# Patient Record
Sex: Male | Born: 1945 | Race: White | Hispanic: No | Marital: Married | Smoking: Former smoker
Health system: Southern US, Community
[De-identification: ages and names within clinical notes are randomized; demographics above are authoritative.]

## PROBLEM LIST (undated history)

## (undated) DIAGNOSIS — M1990 Unspecified osteoarthritis, unspecified site: Secondary | ICD-10-CM

## (undated) DIAGNOSIS — I350 Nonrheumatic aortic (valve) stenosis: Secondary | ICD-10-CM

## (undated) DIAGNOSIS — R011 Cardiac murmur, unspecified: Secondary | ICD-10-CM

## (undated) DIAGNOSIS — J45909 Unspecified asthma, uncomplicated: Secondary | ICD-10-CM

## (undated) DIAGNOSIS — E119 Type 2 diabetes mellitus without complications: Secondary | ICD-10-CM

## (undated) DIAGNOSIS — R350 Frequency of micturition: Secondary | ICD-10-CM

## (undated) DIAGNOSIS — G4733 Obstructive sleep apnea (adult) (pediatric): Secondary | ICD-10-CM

## (undated) DIAGNOSIS — I8390 Asymptomatic varicose veins of unspecified lower extremity: Secondary | ICD-10-CM

## (undated) DIAGNOSIS — E669 Obesity, unspecified: Secondary | ICD-10-CM

## (undated) DIAGNOSIS — S5420XA Injury of radial nerve at forearm level, unspecified arm, initial encounter: Secondary | ICD-10-CM

## (undated) DIAGNOSIS — M1712 Unilateral primary osteoarthritis, left knee: Secondary | ICD-10-CM

## (undated) DIAGNOSIS — C801 Malignant (primary) neoplasm, unspecified: Secondary | ICD-10-CM

## (undated) DIAGNOSIS — I5032 Chronic diastolic (congestive) heart failure: Secondary | ICD-10-CM

## (undated) DIAGNOSIS — Z953 Presence of xenogenic heart valve: Secondary | ICD-10-CM

## (undated) HISTORY — DX: Chronic diastolic (congestive) heart failure: I50.32

## (undated) HISTORY — PX: CARPAL TUNNEL RELEASE: SHX101

## (undated) HISTORY — DX: Asymptomatic varicose veins of unspecified lower extremity: I83.90

## (undated) HISTORY — PX: CERVICAL SPINE SURGERY: SHX589

## (undated) HISTORY — DX: Obesity, unspecified: E66.9

## (undated) HISTORY — PX: PENILE PROSTHESIS IMPLANT: SHX240

## (undated) HISTORY — DX: Type 2 diabetes mellitus without complications: E11.9

## (undated) HISTORY — DX: Obstructive sleep apnea (adult) (pediatric): G47.33

## (undated) HISTORY — PX: COLONOSCOPY: SHX174

## (undated) HISTORY — DX: Injury of radial nerve at forearm level, unspecified arm, initial encounter: S54.20XA

---

## 1989-09-04 HISTORY — PX: SKIN CANCER EXCISION: SHX779

## 2006-02-18 ENCOUNTER — Encounter: Admission: RE | Admit: 2006-02-18 | Discharge: 2006-02-18 | Payer: Self-pay | Admitting: Orthopedic Surgery

## 2010-03-21 HISTORY — PX: POLYPECTOMY: SHX149

## 2010-12-19 ENCOUNTER — Ambulatory Visit (HOSPITAL_BASED_OUTPATIENT_CLINIC_OR_DEPARTMENT_OTHER)
Admission: RE | Admit: 2010-12-19 | Discharge: 2010-12-19 | Disposition: A | Discharge status: Home / Self Care | Payer: Medicare Other | Source: Ambulatory Visit | Attending: Orthopedic Surgery | Admitting: Orthopedic Surgery

## 2010-12-19 DIAGNOSIS — M23359 Other meniscus derangements, posterior horn of lateral meniscus, unspecified knee: Secondary | ICD-10-CM | POA: Insufficient documentation

## 2010-12-19 DIAGNOSIS — J449 Chronic obstructive pulmonary disease, unspecified: Secondary | ICD-10-CM | POA: Insufficient documentation

## 2010-12-19 DIAGNOSIS — Z79899 Other long term (current) drug therapy: Secondary | ICD-10-CM | POA: Insufficient documentation

## 2010-12-19 DIAGNOSIS — I10 Essential (primary) hypertension: Secondary | ICD-10-CM | POA: Insufficient documentation

## 2010-12-19 DIAGNOSIS — E119 Type 2 diabetes mellitus without complications: Secondary | ICD-10-CM | POA: Insufficient documentation

## 2010-12-19 DIAGNOSIS — M659 Unspecified synovitis and tenosynovitis, unspecified site: Secondary | ICD-10-CM | POA: Insufficient documentation

## 2010-12-19 DIAGNOSIS — M23329 Other meniscus derangements, posterior horn of medial meniscus, unspecified knee: Secondary | ICD-10-CM | POA: Insufficient documentation

## 2010-12-19 DIAGNOSIS — M2240 Chondromalacia patellae, unspecified knee: Secondary | ICD-10-CM | POA: Insufficient documentation

## 2010-12-19 DIAGNOSIS — E669 Obesity, unspecified: Secondary | ICD-10-CM | POA: Insufficient documentation

## 2010-12-19 DIAGNOSIS — J4489 Other specified chronic obstructive pulmonary disease: Secondary | ICD-10-CM | POA: Insufficient documentation

## 2010-12-19 LAB — POCT I-STAT, CHEM 8
Calcium, Ion: 1.19 mmol/L (ref 1.12–1.32)
Chloride: 102 mEq/L (ref 96–112)
Creatinine, Ser: 1 mg/dL (ref 0.4–1.5)
HCT: 44 % (ref 39.0–52.0)
Hemoglobin: 15 g/dL (ref 13.0–17.0)
Potassium: 4.1 mEq/L (ref 3.5–5.1)
Sodium: 137 mEq/L (ref 135–145)

## 2010-12-19 LAB — GLUCOSE, CAPILLARY: Glucose-Capillary: 208 mg/dL — ABNORMAL HIGH (ref 70–99)

## 2010-12-25 NOTE — Op Note (Signed)
NAMEGROVE, DEFINA               ACCOUNT NO.:  [id number]  MEDICAL RECORD NO.:  [id number]          PATIENT TYPE:  LOCATION:                                 FACILITY:  PHYSICIAN:  Elana Alm. Thurston Hole, M.D.      DATE OF BIRTH:  DATE OF PROCEDURE:  12/19/2010 DATE OF DISCHARGE:                              OPERATIVE REPORT   PREOPERATIVE DIAGNOSIS:  Left knee medial and lateral meniscal tears with chondromalacia and synovitis.  POSTOPERATIVE DIAGNOSIS:  Left knee medial and lateral meniscal tears with chondromalacia and synovitis.  PROCEDURE: 1. Left knee EUA followed by arthroscopic partial medial lateral     meniscectomies. 2. Left knee chondroplasty with partial synovectomy.  SURGEON:  Elana Alm. Thurston Hole, MD  ANESTHESIA:  General.  OPERATIVE TIME:  30 minutes.  COMPLICATIONS:  None.  INDICATIONS FOR PROCEDURE:  Mr. Reczek is a 64-year gentleman who has had significant increasing left knee pain over the past 3-4 months with exam and MRI documenting meniscal tearing with chondromalacia and synovitis. He has failed conservative care and is now to undergo arthroscopy.  DESCRIPTION OF PROCEDURE:  Mr. Barbian was brought to the operating room on December 19, 2010, after knee block was placed in the holding by Anesthesia.  He was placed on the operating table in supine position. He received Ancef 2 g IV preoperatively for prophylaxis.  After being placed under general anesthesia, his left knee was examined.  He had full range of motion.  Knee was stable.  Ligamentous exam with normal patellar tracking.  Left leg was prepped using sterile DuraPrep and draped using sterile technique.  Time-out procedure was called and the correct left knee identified.  Initially, through an anterolateral portal, the arthroscope with a pump attached was placed and through an anteromedial portal, an arthroscopic probe was placed.  On initial inspection of the medial compartment, he was found to have  75% grade 3 chondromalacia, which was debrided.  Medial meniscus tear of the posterior and medial horn of which 30% was resected back to a stable rim.  Intercondylar notch inspected.  Anteroposterior cruciate ligaments were normal.  Lateral compartment inspected.  The articular cartilage lateral compartment showed 30-40% grade 3 chondromalacia which was debrided.  Lateral meniscus showed tearing of the posterior and lateral horn of which 30% was resected back to a stable rim.  Patellofemoral joint showed 75% grade 3 chondromalacia which was debrided.  The patella tracked normally.  Moderate synovitis.  Medial and lateral gutters were debrided, otherwise this free of pathology.  After this done, it felt that all pathology have been satisfactorily addressed.  The instruments removed.  Portals were closed with 3-0 nylon suture.  Sterile dressings were applied.  The patient awakened and taken to the recovery room in stable condition.  FOLLOWUP CARE:  Mr. Harn will be followed as an outpatient on Norco for pain.  She will be back in the office in a week for sutures out and followup.     Hollyanne Schloesser A. Thurston Hole, M.D.     RAW/MEDQ  D:  12/19/2010  T:  12/20/2010  Job:  [id number]  Electronically Signed  by Salvatore Marvel M.D. on 12/25/2010 01:06:50 PM

## 2011-09-05 HISTORY — PX: KNEE SURGERY: SHX244

## 2011-09-05 HISTORY — PX: SHOULDER SURGERY: SHX246

## 2015-07-01 ENCOUNTER — Inpatient Hospital Stay (HOSPITAL_COMMUNITY)
Admission: RE | Admit: 2015-07-01 | Discharge: 2015-07-01 | Disposition: A | Discharge status: Home / Self Care | Payer: Medicare Other | Source: Ambulatory Visit

## 2015-07-01 ENCOUNTER — Other Ambulatory Visit (HOSPITAL_COMMUNITY): Payer: Medicare Other

## 2015-07-02 ENCOUNTER — Encounter: Payer: Self-pay | Admitting: *Deleted

## 2015-07-02 ENCOUNTER — Ambulatory Visit (INDEPENDENT_AMBULATORY_CARE_PROVIDER_SITE_OTHER): Payer: Medicare Other | Admitting: Cardiology

## 2015-07-02 VITALS — BP 135/84 | HR 89 | Ht 73.0 in | Wt 274.0 lb

## 2015-07-02 DIAGNOSIS — R011 Cardiac murmur, unspecified: Secondary | ICD-10-CM

## 2015-07-02 DIAGNOSIS — Z01810 Encounter for preprocedural cardiovascular examination: Secondary | ICD-10-CM

## 2015-07-02 NOTE — Patient Instructions (Signed)
Continue all current medications. Your physician has requested that you have an echocardiogram. Echocardiography is a painless test that uses sound waves to create images of your heart. It provides your doctor with information about the size and shape of your heart and how well your heart's chambers and valves are working. This procedure takes approximately one hour. There are no restrictions for this procedure. Office will contact with results via phone or letter.   Follow up based off test results from echo.

## 2015-07-02 NOTE — Progress Notes (Signed)
Patient ID: Brent Cobb, male   DOB: 25-May-1946, 69 y.o.   MRN: [id number]       Clinical Summary Brent Cobb is a 69 y.o.male seen today as a new patient fo the following medical problems.   1. Preop evaluation - being considered for knee replacement - denies any history of heart disease.  - 3 episodes of chest pain over the 2 last weeks. 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 Hard pain left chest/heaviness, 4/10. No other associated symptoms. Better with deep breaths. Lasted about 30 seconds. Similar episodes x 3.  - exertion limited due to knee pain. States can walk up 1-2 flights of stairs, can walk approx 2 blocks. Mainly limited by knee pain.   CAD risk factors: DM2, HL but not on statin due to side effects, prior smoke x 33 years, sister with valve surgery, mother MI mid 93s.   2. Heart murmur - patient reports recently told he has a heart murmur by pcp  Past Medical History  Diagnosis Date  . Diabetes mellitus ([place])      No Known Allergies   Current Outpatient Prescriptions  Medication Sig Dispense Refill  . ALPRAZolam (XANAX) 0.5 MG tablet Take 0.5 mg by mouth at bedtime.    Marland Kitchen aspirin EC 81 MG tablet Take 81 mg by mouth daily.    . cholecalciferol (VITAMIN D) 1000 UNITS tablet Take 1,000 Units by mouth daily.    . insulin NPH Human (HUMULIN N,NOVOLIN N) 100 UNIT/ML injection Inject 30 Units into the skin 3 (three) times daily.     . Liraglutide (VICTOZA) 18 MG/3ML SOPN Inject 18 mg into the skin daily.    Marland Kitchen losartan (COZAAR) 100 MG tablet Take 100 mg by mouth daily.    . meloxicam (MOBIC) 7.5 MG tablet Take 7.5 mg by mouth daily.    . sitaGLIPtin-metformin (JANUMET) 50-1000 MG tablet Take 1 tablet by mouth 2 (two) times daily with a meal.    . VITAMIN E PO Take 1 capsule by mouth daily.     No current facility-administered medications for this visit.     Past Surgical History  Procedure Laterality Date  . Polypectomy  03/21/2010  . Cervical spine surgery        30 years ago and last time 10 years ago  . Skin cancer excision  1991    removal of skin cancer to right cheek area  . Knee surgery Left 2013  . Shoulder surgery Left 2013     No Known Allergies    Family History  Problem Relation Age of Onset  . Heart attack Mother   . Alcoholism Father   . Diabetes Brother   . Valvular heart disease Sister      Social History Brent Cobb reports that he quit smoking about 25 years ago. His smoking use included Cigarettes. He started smoking about 58 years ago. He has a 49.5 pack-year smoking history. He quit smokeless tobacco use about 45 years ago. Brent Cobb has no alcohol history on file.   Review of Systems CONSTITUTIONAL: No weight loss, fever, chills, weakness or fatigue.  HEENT: Eyes: No visual loss, blurred vision, double vision or yellow sclerae.No hearing loss, sneezing, congestion, runny nose or sore throat.  SKIN: No rash or itching.  CARDIOVASCULAR: per hpi RESPIRATORY: No cough or sputum.  GASTROINTESTINAL: No anorexia, nausea, vomiting or diarrhea. No abdominal pain or blood.  GENITOURINARY: No burning on urination, no polyuria NEUROLOGICAL: No headache, dizziness, syncope, paralysis, ataxia, numbness  or tingling in the extremities. No change in bowel or bladder control.  MUSCULOSKELETAL: knee pain  LYMPHATICS: No enlarged nodes. No history of splenectomy.  PSYCHIATRIC: No history of depression or anxiety.  ENDOCRINOLOGIC: No reports of sweating, cold or heat intolerance. No polyuria or polydipsia.  Marland Kitchen   Physical Examination Filed Vitals:   07/02/15 1520  BP: 135/84  Pulse: 89   Filed Weights   07/02/15 1520  Weight: 274 lb (124.286 kg)    Gen: resting comfortably, no acute distress HEENT: no scleral icterus, pupils equal round and reactive, no palptable cervical adenopathy,  CV: RRR, 3/6 systolic murmur RUSB, no jvd Resp: Clear to auscultation bilaterally GI: abdomen is soft, non-tender, non-distended, normal  bowel sounds, no hepatosplenomegaly MSK: extremities are warm, no edema.  Skin: warm, no rash Neuro:  no focal deficits Psych: appropriate affect    Assessment and Plan   1. Heart murmur - will obtain echo  2 Preop evaluation - with heart murmur and episodes of chest pain would delay surgery until workup complete. F/u echo results, likely consider lexiscan pending echo results  F/u pending rest results     Arnoldo Lenis, M.D.

## 2015-07-07 ENCOUNTER — Ambulatory Visit (HOSPITAL_COMMUNITY)
Admission: RE | Admit: 2015-07-07 | Discharge: 2015-07-07 | Disposition: A | Discharge status: Home / Self Care | Payer: Medicare Other | Source: Ambulatory Visit | Attending: Cardiology | Admitting: Cardiology

## 2015-07-07 DIAGNOSIS — R011 Cardiac murmur, unspecified: Secondary | ICD-10-CM | POA: Diagnosis present

## 2015-07-08 ENCOUNTER — Telehealth: Payer: Self-pay | Admitting: *Deleted

## 2015-07-08 NOTE — Telephone Encounter (Signed)
Pt aware, scheduled for 11/11, routed results to pcp

## 2015-07-08 NOTE — Telephone Encounter (Signed)
-----   Message from Arnoldo Lenis, MD sent at 07/08/2015 11:45 AM EDT ----- Echo shows that his aortic valve does not open very well (something called aortic stenosis), this is the cause of his heart murmur. We need to discuss from further testing on his heart valve, can we add him to one of me [place] days coming up over the next 1-2 weeks, looks like there are some openings. We need to look resolve this heart issue before we can clear him to have his knee replacement  Zandra Abts MD

## 2015-07-12 ENCOUNTER — Inpatient Hospital Stay (HOSPITAL_COMMUNITY): Admission: RE | Admit: 2015-07-12 | Payer: Medicare Other | Source: Ambulatory Visit | Admitting: Orthopedic Surgery

## 2015-07-12 ENCOUNTER — Encounter (HOSPITAL_COMMUNITY): Admission: RE | Payer: Self-pay | Source: Ambulatory Visit

## 2015-07-12 SURGERY — ARTHROPLASTY, KNEE, TOTAL
Anesthesia: General | Laterality: Left

## 2015-07-16 ENCOUNTER — Encounter: Payer: Self-pay | Admitting: *Deleted

## 2015-07-16 ENCOUNTER — Telehealth: Payer: Self-pay | Admitting: Cardiology

## 2015-07-16 ENCOUNTER — Encounter: Payer: Self-pay | Admitting: Cardiology

## 2015-07-16 ENCOUNTER — Ambulatory Visit (INDEPENDENT_AMBULATORY_CARE_PROVIDER_SITE_OTHER): Payer: Medicare Other | Admitting: Cardiology

## 2015-07-16 VITALS — BP 116/78 | HR 98 | Ht 73.0 in | Wt 274.2 lb

## 2015-07-16 DIAGNOSIS — I350 Nonrheumatic aortic (valve) stenosis: Secondary | ICD-10-CM | POA: Diagnosis not present

## 2015-07-16 NOTE — Patient Instructions (Signed)
Your physician recommends that you schedule a follow-up appointment TO BE DETERMINED AFTER PROCEDURE  Your physician recommends that you continue on your current medications as directed. Please refer to the Current Medication list given to you today.  Your physician has requested that you have a cardiac catheterization. Cardiac catheterization is used to diagnose and/or treat various heart conditions. Doctors may recommend this procedure for a number of different reasons. The most common reason is to evaluate chest pain. Chest pain can be a symptom of coronary artery disease (CAD), and cardiac catheterization can show whether plaque is narrowing or blocking your heart's arteries. This procedure is also used to evaluate the valves, as well as measure the blood flow and oxygen levels in different parts of your heart. For further information please visit [email or web address]. Please follow instruction sheet, as given.  Thank you for choosing [facility]!!

## 2015-07-16 NOTE — Telephone Encounter (Signed)
Pt has Medicare and supplement. No precert required °

## 2015-07-16 NOTE — Telephone Encounter (Signed)
cath 11/16 1PM checking percert

## 2015-07-16 NOTE — Progress Notes (Signed)
Patient ID: Brent Cobb, male   DOB: 1945-10-02, 69 y.o.   MRN: [id number]     Clinical Summary Brent Cobb is a 69 y.o.male seen today for follow up of the following medical problems.   1. Aortic stenosis - patient seen last visit for preop evaluation prior to knee surgery due to murmur - echo showed LVEF 60-65%, severe AS with mean grad 42 and AVA by VTI of 1 and by VMAX of 0.96 - exertion is primarily limited by knee pain. He has had some episodes of exertional chest pain over the last few weeks.  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 Hard pain left chest/heaviness, 4/10. No other associated symptoms. Better with deep breaths. Lasted about 30 seconds. Similar episodes x 3.  - exertion limited due to knee pain. States can walk up 1-2 flights of stairs, can walk approx 2 blocks. Mainly limited by knee pain.   .  Past Medical History  Diagnosis Date  . Diabetes mellitus ([place])      No Known Allergies   Current Outpatient Prescriptions  Medication Sig Dispense Refill  . ALPRAZolam (XANAX) 0.5 MG tablet Take 0.5 mg by mouth at bedtime.    Marland Kitchen aspirin EC 81 MG tablet Take 81 mg by mouth daily.    . cholecalciferol (VITAMIN D) 1000 UNITS tablet Take 1,000 Units by mouth daily.    . insulin NPH Human (HUMULIN N,NOVOLIN N) 100 UNIT/ML injection Inject 30 Units into the skin 3 (three) times daily.     . Liraglutide (VICTOZA) 18 MG/3ML SOPN Inject 18 mg into the skin daily.    Marland Kitchen losartan (COZAAR) 100 MG tablet Take 100 mg by mouth daily.    . meloxicam (MOBIC) 7.5 MG tablet Take 7.5 mg by mouth daily.    . sitaGLIPtin-metformin (JANUMET) 50-1000 MG tablet Take 1 tablet by mouth 2 (two) times daily with a meal.    . VITAMIN E PO Take 1 capsule by mouth daily.     No current facility-administered medications for this visit.     Past Surgical History  Procedure Laterality Date  . Polypectomy  03/21/2010  . Cervical spine surgery      30 years ago and last time 10 years ago  .  Skin cancer excision  1991    removal of skin cancer to right cheek area  . Knee surgery Left 2013  . Shoulder surgery Left 2013     No Known Allergies    Family History  Problem Relation Age of Onset  . Heart attack Mother   . Alcoholism Father   . Diabetes Brother   . Valvular heart disease Sister      Social History Brent Cobb reports that he quit smoking about 25 years ago. His smoking use included Cigarettes. He started smoking about 58 years ago. He has a 49.5 pack-year smoking history. He quit smokeless tobacco use about 45 years ago. Brent Cobb has no alcohol history on file.   Review of Systems CONSTITUTIONAL: No weight loss, fever, chills, weakness or fatigue.  HEENT: Eyes: No visual loss, blurred vision, double vision or yellow sclerae.No hearing loss, sneezing, congestion, runny nose or sore throat.  SKIN: No rash or itching.  CARDIOVASCULAR: per HPI RESPIRATORY: No shortness of breath, cough or sputum.  GASTROINTESTINAL: No anorexia, nausea, vomiting or diarrhea. No abdominal pain or blood.  GENITOURINARY: No burning on urination, no polyuria NEUROLOGICAL: No headache, dizziness, syncope, paralysis, ataxia, numbness or tingling in the extremities. No change  in bowel or bladder control.  MUSCULOSKELETAL: knee pain LYMPHATICS: No enlarged nodes. No history of splenectomy.  PSYCHIATRIC: No history of depression or anxiety.  ENDOCRINOLOGIC: No reports of sweating, cold or heat intolerance. No polyuria or polydipsia.  Marland Kitchen   Physical Examination Filed Vitals:   07/16/15 1308  BP: 116/78  Pulse: 98   Filed Vitals:   07/16/15 1308  Height: 6\' 1"  (1.854 m)  Weight: 274 lb 3.2 oz (124.376 kg)    Gen: resting comfortably, no acute distress HEENT: no scleral icterus, pupils equal round and reactive, no palptable cervical adenopathy,  CV: RRR, 3/6 systolic murmur, no jvd Resp: Clear to auscultation bilaterally GI: abdomen is soft, non-tender, non-distended, normal  bowel sounds, no hepatosplenomegaly MSK: extremities are warm, no edema.  Skin: warm, no rash Neuro:  no focal deficits Psych: appropriate affect   Diagnostic Studies 07/2015 echo Study Conclusions  - Left ventricle: The cavity size was normal. Wall thickness was increased in a pattern of severe LVH. Systolic function was normal. The estimated ejection fraction was in the range of 60% to 65%. Wall motion was normal; there were no regional wall motion abnormalities. Doppler parameters are consistent with abnormal left ventricular relaxation (grade 1 diastolic dysfunction). - Aortic valve: Severely calcified annulus. Trileaflet; severely thickened leaflets. There was severe stenosis. The contour of the spectral Doppler waveform may lead to underestimation of severity. Valve area (VTI): 1.01 cm^2. Valve area (Vmax): 0.96 cm^2. Valve area (Vmean): 0.79 cm^2. - Mitral valve: Mildly calcified annulus. Mildly thickened leaflets . - Left atrium: The atrium was mildly dilated. - Technically adequate study.    Assessment and Plan  1. Severe aortic stenosis - patient initially referred for heart murmur evaluation prior to consideration for knee surgery. - echo consistent with severe AS. Exertion limited by knee pain difficult to assess symptoms, however has had episodes of chest pain recently that could be related.  - will refer for cath to evaluate coronaries along with AV study and RHC - in setting of severe AS he is high risk for elective ortho surgery and recommend postponing surgery at this time.    F/u pending cath results  Arnoldo Lenis, M.D.

## 2015-07-18 ENCOUNTER — Other Ambulatory Visit: Payer: Self-pay | Admitting: Cardiology

## 2015-07-18 DIAGNOSIS — I350 Nonrheumatic aortic (valve) stenosis: Secondary | ICD-10-CM

## 2015-07-19 ENCOUNTER — Ambulatory Visit: Payer: Medicare Other | Admitting: Cardiology

## 2015-07-21 ENCOUNTER — Encounter (HOSPITAL_COMMUNITY)
Admission: RE | Disposition: A | Discharge status: Home / Self Care | Payer: Self-pay | Source: Ambulatory Visit | Attending: Cardiovascular Disease

## 2015-07-21 ENCOUNTER — Encounter (HOSPITAL_COMMUNITY): Payer: Self-pay | Admitting: Cardiovascular Disease

## 2015-07-21 ENCOUNTER — Ambulatory Visit (HOSPITAL_COMMUNITY)
Admission: RE | Admit: 2015-07-21 | Discharge: 2015-07-21 | Disposition: A | Discharge status: Home / Self Care | Payer: Medicare Other | Source: Ambulatory Visit | Attending: Cardiovascular Disease | Admitting: Cardiovascular Disease

## 2015-07-21 DIAGNOSIS — Z87891 Personal history of nicotine dependence: Secondary | ICD-10-CM | POA: Insufficient documentation

## 2015-07-21 DIAGNOSIS — Z7984 Long term (current) use of oral hypoglycemic drugs: Secondary | ICD-10-CM | POA: Diagnosis not present

## 2015-07-21 DIAGNOSIS — I350 Nonrheumatic aortic (valve) stenosis: Secondary | ICD-10-CM | POA: Diagnosis not present

## 2015-07-21 DIAGNOSIS — Z79899 Other long term (current) drug therapy: Secondary | ICD-10-CM | POA: Diagnosis not present

## 2015-07-21 DIAGNOSIS — E119 Type 2 diabetes mellitus without complications: Secondary | ICD-10-CM | POA: Diagnosis not present

## 2015-07-21 DIAGNOSIS — Z8249 Family history of ischemic heart disease and other diseases of the circulatory system: Secondary | ICD-10-CM | POA: Insufficient documentation

## 2015-07-21 DIAGNOSIS — Z7982 Long term (current) use of aspirin: Secondary | ICD-10-CM | POA: Diagnosis not present

## 2015-07-21 DIAGNOSIS — I2510 Atherosclerotic heart disease of native coronary artery without angina pectoris: Secondary | ICD-10-CM | POA: Insufficient documentation

## 2015-07-21 HISTORY — DX: Nonrheumatic aortic (valve) stenosis: I35.0

## 2015-07-21 HISTORY — PX: CARDIAC CATHETERIZATION: SHX172

## 2015-07-21 LAB — POCT I-STAT 3, VENOUS BLOOD GAS (G3P V)
Acid-base deficit: 1 mmol/L (ref 0.0–2.0)
BICARBONATE: 24.9 meq/L — AB (ref 20.0–24.0)
O2 SAT: 63 %
PCO2 VEN: 44.5 mmHg — AB (ref 45.0–50.0)
PO2 VEN: 34 mmHg (ref 30.0–45.0)
TCO2: 26 mmol/L (ref 0–100)
pH, Ven: 7.355 — ABNORMAL HIGH (ref 7.250–7.300)

## 2015-07-21 LAB — POCT I-STAT, CHEM 8
BUN: 20 mg/dL (ref 6–20)
CHLORIDE: 103 mmol/L (ref 101–111)
CREATININE: 1 mg/dL (ref 0.61–1.24)
Calcium, Ion: 1.26 mmol/L (ref 1.13–1.30)
Glucose, Bld: 302 mg/dL — ABNORMAL HIGH (ref 65–99)
HCT: 41 % (ref 39.0–52.0)
HEMOGLOBIN: 13.9 g/dL (ref 13.0–17.0)
Potassium: 4.7 mmol/L (ref 3.5–5.1)
Sodium: 137 mmol/L (ref 135–145)
TCO2: 22 mmol/L (ref 0–100)

## 2015-07-21 LAB — POCT I-STAT 3, ART BLOOD GAS (G3+)
Acid-base deficit: 2 mmol/L (ref 0.0–2.0)
Bicarbonate: 23 mEq/L (ref 20.0–24.0)
O2 SAT: 94 %
PCO2 ART: 38.5 mmHg (ref 35.0–45.0)
PH ART: 7.383 (ref 7.350–7.450)
PO2 ART: 70 mmHg — AB (ref 80.0–100.0)
TCO2: 24 mmol/L (ref 0–100)

## 2015-07-21 LAB — PROTIME-INR
INR: 1.01 (ref 0.00–1.49)
PROTHROMBIN TIME: 13.5 s (ref 11.6–15.2)

## 2015-07-21 LAB — BASIC METABOLIC PANEL
ANION GAP: 7 (ref 5–15)
BUN: 19 mg/dL (ref 6–20)
CALCIUM: 9.4 mg/dL (ref 8.9–10.3)
CO2: 25 mmol/L (ref 22–32)
Chloride: 102 mmol/L (ref 101–111)
Creatinine, Ser: 1.11 mg/dL (ref 0.61–1.24)
GFR calc Af Amer: >60 mL/min (ref >60)
GLUCOSE: 299 mg/dL — AB (ref 65–99)
Potassium: 6.4 mmol/L (ref 3.5–5.1)
Sodium: 134 mmol/L — ABNORMAL LOW (ref 135–145)

## 2015-07-21 LAB — CBC
HEMATOCRIT: 43.3 % (ref 39.0–52.0)
HEMOGLOBIN: 13.8 g/dL (ref 13.0–17.0)
MCH: 28.7 pg (ref 26.0–34.0)
MCHC: 31.9 g/dL (ref 30.0–36.0)
MCV: 90 fL (ref 78.0–100.0)
Platelets: 286 10*3/uL (ref 150–400)
RBC: 4.81 MIL/uL (ref 4.22–5.81)
RDW: 13.9 % (ref 11.5–15.5)
WBC: 7.5 10*3/uL (ref 4.0–10.5)

## 2015-07-21 LAB — GLUCOSE, CAPILLARY: Glucose-Capillary: 237 mg/dL — ABNORMAL HIGH (ref 65–99)

## 2015-07-21 SURGERY — RIGHT/LEFT HEART CATH AND CORONARY ANGIOGRAPHY

## 2015-07-21 MED ORDER — ASPIRIN 81 MG PO CHEW
CHEWABLE_TABLET | ORAL | Status: AC
  Administered 2015-07-21: 81 mg via ORAL
  Filled 2015-07-21: qty 1

## 2015-07-21 MED ORDER — SODIUM CHLORIDE 0.9 % IJ SOLN: 3.0000 mL | INTRAMUSCULAR | Status: DC | PRN

## 2015-07-21 MED ORDER — ONDANSETRON HCL 4 MG/2ML IJ SOLN: 4.0000 mg | Freq: Four times a day (QID) | INTRAMUSCULAR | Status: DC | PRN

## 2015-07-21 MED ORDER — SODIUM CHLORIDE 0.9 % IV SOLN: 250.0000 mL | INTRAVENOUS | Status: DC | PRN

## 2015-07-21 MED ORDER — HEPARIN SODIUM (PORCINE) 1000 UNIT/ML IJ SOLN
INTRAMUSCULAR | Status: AC
  Filled 2015-07-21: qty 1

## 2015-07-21 MED ORDER — SODIUM CHLORIDE 0.9 % IV SOLN: INTRAVENOUS | Status: DC

## 2015-07-21 MED ORDER — SODIUM CHLORIDE 0.9 % WEIGHT BASED INFUSION: 1.0000 mL/kg/h | INTRAVENOUS | Status: DC

## 2015-07-21 MED ORDER — FENTANYL CITRATE (PF) 100 MCG/2ML IJ SOLN
INTRAMUSCULAR | Status: AC
  Filled 2015-07-21: qty 2

## 2015-07-21 MED ORDER — LIDOCAINE HCL (PF) 1 % IJ SOLN
INTRAMUSCULAR | Status: DC | PRN
  Administered 2015-07-21: 6 mL

## 2015-07-21 MED ORDER — SODIUM CHLORIDE 0.9 % IJ SOLN: 3.0000 mL | Freq: Two times a day (BID) | INTRAMUSCULAR | Status: DC

## 2015-07-21 MED ORDER — ACETAMINOPHEN 325 MG PO TABS: 650.0000 mg | ORAL_TABLET | ORAL | Status: DC | PRN

## 2015-07-21 MED ORDER — FENTANYL CITRATE (PF) 100 MCG/2ML IJ SOLN
INTRAMUSCULAR | Status: DC | PRN
  Administered 2015-07-21: 25 ug via INTRAVENOUS

## 2015-07-21 MED ORDER — ASPIRIN 81 MG PO CHEW
81.0000 mg | CHEWABLE_TABLET | ORAL | Status: AC
  Administered 2015-07-21: 81 mg via ORAL

## 2015-07-21 MED ORDER — LIDOCAINE HCL (PF) 1 % IJ SOLN
INTRAMUSCULAR | Status: AC
  Filled 2015-07-21: qty 30

## 2015-07-21 MED ORDER — HEPARIN SODIUM (PORCINE) 1000 UNIT/ML IJ SOLN
INTRAMUSCULAR | Status: DC | PRN
  Administered 2015-07-21: 5000 [IU] via INTRAVENOUS

## 2015-07-21 MED ORDER — MIDAZOLAM HCL 2 MG/2ML IJ SOLN
INTRAMUSCULAR | Status: AC
  Filled 2015-07-21: qty 2

## 2015-07-21 MED ORDER — MIDAZOLAM HCL 2 MG/2ML IJ SOLN
INTRAMUSCULAR | Status: DC | PRN
  Administered 2015-07-21: 2 mg via INTRAVENOUS

## 2015-07-21 MED ORDER — VERAPAMIL HCL 2.5 MG/ML IV SOLN
INTRAVENOUS | Status: DC | PRN
  Administered 2015-07-21: 10 mL via INTRA_ARTERIAL

## 2015-07-21 MED ORDER — HEPARIN (PORCINE) IN NACL 2-0.9 UNIT/ML-% IJ SOLN
INTRAMUSCULAR | Status: AC
  Filled 2015-07-21: qty 500

## 2015-07-21 MED ORDER — VERAPAMIL HCL 2.5 MG/ML IV SOLN
INTRAVENOUS | Status: AC
  Filled 2015-07-21: qty 2

## 2015-07-21 MED ORDER — SODIUM CHLORIDE 0.9 % WEIGHT BASED INFUSION
3.0000 mL/kg/h | INTRAVENOUS | Status: AC
  Administered 2015-07-21: 3 mL/kg/h via INTRAVENOUS

## 2015-07-21 SURGICAL SUPPLY — 15 items
CATH BALLN WEDGE 5F 110CM (CATHETERS) ×2 IMPLANT
CATH INFINITI 5 FR AR1 MOD (CATHETERS) ×2 IMPLANT
CATH INFINITI 5 FR JL3.5 (CATHETERS) ×3 IMPLANT
CATH INFINITI 5FR ANG PIGTAIL (CATHETERS) ×3 IMPLANT
CATH INFINITI 5FR JL4 (CATHETERS) ×2 IMPLANT
CATH INFINITI JR4 5F (CATHETERS) ×3 IMPLANT
DEVICE RAD COMP TR BAND LRG (VASCULAR PRODUCTS) ×3 IMPLANT
GLIDESHEATH SLEND SS 6F .021 (SHEATH) ×3 IMPLANT
KIT HEART LEFT (KITS) ×3 IMPLANT
KIT HEART RIGHT NAMIC (KITS) ×3 IMPLANT
PACK CARDIAC CATHETERIZATION (CUSTOM PROCEDURE TRAY) ×3 IMPLANT
SHEATH FAST CATH BRACH 5F 5CM (SHEATH) ×2 IMPLANT
TRANSDUCER W/STOPCOCK (MISCELLANEOUS) ×6 IMPLANT
TUBING CIL FLEX 10 FLL-RA (TUBING) ×3 IMPLANT
WIRE SAFE-T 1.5MM-J .035X260CM (WIRE) ×3 IMPLANT

## 2015-07-21 NOTE — Interval H&P Note (Signed)
History and Physical Interval Note:  07/21/2015 1:10 PM  Brent Cobb  has presented today for surgery, with the diagnosis of aortic stenosis  The various methods of treatment have been discussed with the patient and family. After consideration of risks, benefits and other options for treatment, the patient has consented to  Procedure(s): Right/Left Heart Cath and Coronary Angiography (N/A) as a surgical intervention .  The patient's history has been reviewed, patient examined, no change in status, stable for surgery.  I have reviewed the patient's chart and labs.  Questions were answered to the patient's satisfaction.     Sherren Mocha

## 2015-07-21 NOTE — H&P (View-Only) (Signed)
Patient ID: Brent Cobb, male   DOB: 12/06/1945, 69 y.o.   MRN: [id number]     Clinical Summary Mr. Boyko is a 69 y.o.male seen today for follow up of the following medical problems.   1. Aortic stenosis - patient seen last visit for preop evaluation prior to knee surgery due to murmur - echo showed LVEF 60-65%, severe AS with mean grad 42 and AVA by VTI of 1 and by VMAX of 0.96 - exertion is primarily limited by knee pain. He has had some episodes of exertional chest pain over the last few weeks.  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 Hard pain left chest/heaviness, 4/10. No other associated symptoms. Better with deep breaths. Lasted about 30 seconds. Similar episodes x 3.  - exertion limited due to knee pain. States can walk up 1-2 flights of stairs, can walk approx 2 blocks. Mainly limited by knee pain.   .  Past Medical History  Diagnosis Date  . Diabetes mellitus ([place])      No Known Allergies   Current Outpatient Prescriptions  Medication Sig Dispense Refill  . ALPRAZolam (XANAX) 0.5 MG tablet Take 0.5 mg by mouth at bedtime.    Marland Kitchen aspirin EC 81 MG tablet Take 81 mg by mouth daily.    . cholecalciferol (VITAMIN D) 1000 UNITS tablet Take 1,000 Units by mouth daily.    . insulin NPH Human (HUMULIN N,NOVOLIN N) 100 UNIT/ML injection Inject 30 Units into the skin 3 (three) times daily.     . Liraglutide (VICTOZA) 18 MG/3ML SOPN Inject 18 mg into the skin daily.    Marland Kitchen losartan (COZAAR) 100 MG tablet Take 100 mg by mouth daily.    . meloxicam (MOBIC) 7.5 MG tablet Take 7.5 mg by mouth daily.    . sitaGLIPtin-metformin (JANUMET) 50-1000 MG tablet Take 1 tablet by mouth 2 (two) times daily with a meal.    . VITAMIN E PO Take 1 capsule by mouth daily.     No current facility-administered medications for this visit.     Past Surgical History  Procedure Laterality Date  . Polypectomy  03/21/2010  . Cervical spine surgery      30 years ago and last time 10 years ago  .  Skin cancer excision  1991    removal of skin cancer to right cheek area  . Knee surgery Left 2013  . Shoulder surgery Left 2013     No Known Allergies    Family History  Problem Relation Age of Onset  . Heart attack Mother   . Alcoholism Father   . Diabetes Brother   . Valvular heart disease Sister      Social History Mr. Ziman reports that he quit smoking about 25 years ago. His smoking use included Cigarettes. He started smoking about 58 years ago. He has a 49.5 pack-year smoking history. He quit smokeless tobacco use about 45 years ago. Mr. Gendron has no alcohol history on file.   Review of Systems CONSTITUTIONAL: No weight loss, fever, chills, weakness or fatigue.  HEENT: Eyes: No visual loss, blurred vision, double vision or yellow sclerae.No hearing loss, sneezing, congestion, runny nose or sore throat.  SKIN: No rash or itching.  CARDIOVASCULAR: per HPI RESPIRATORY: No shortness of breath, cough or sputum.  GASTROINTESTINAL: No anorexia, nausea, vomiting or diarrhea. No abdominal pain or blood.  GENITOURINARY: No burning on urination, no polyuria NEUROLOGICAL: No headache, dizziness, syncope, paralysis, ataxia, numbness or tingling in the extremities. No change  in bowel or bladder control.  MUSCULOSKELETAL: knee pain LYMPHATICS: No enlarged nodes. No history of splenectomy.  PSYCHIATRIC: No history of depression or anxiety.  ENDOCRINOLOGIC: No reports of sweating, cold or heat intolerance. No polyuria or polydipsia.  Marland Kitchen   Physical Examination Filed Vitals:   07/16/15 1308  BP: 116/78  Pulse: 98   Filed Vitals:   07/16/15 1308  Height: 6\' 1"  (1.854 m)  Weight: 274 lb 3.2 oz (124.376 kg)    Gen: resting comfortably, no acute distress HEENT: no scleral icterus, pupils equal round and reactive, no palptable cervical adenopathy,  CV: RRR, 3/6 systolic murmur, no jvd Resp: Clear to auscultation bilaterally GI: abdomen is soft, non-tender, non-distended, normal  bowel sounds, no hepatosplenomegaly MSK: extremities are warm, no edema.  Skin: warm, no rash Neuro:  no focal deficits Psych: appropriate affect   Diagnostic Studies 07/2015 echo Study Conclusions  - Left ventricle: The cavity size was normal. Wall thickness was increased in a pattern of severe LVH. Systolic function was normal. The estimated ejection fraction was in the range of 60% to 65%. Wall motion was normal; there were no regional wall motion abnormalities. Doppler parameters are consistent with abnormal left ventricular relaxation (grade 1 diastolic dysfunction). - Aortic valve: Severely calcified annulus. Trileaflet; severely thickened leaflets. There was severe stenosis. The contour of the spectral Doppler waveform may lead to underestimation of severity. Valve area (VTI): 1.01 cm^2. Valve area (Vmax): 0.96 cm^2. Valve area (Vmean): 0.79 cm^2. - Mitral valve: Mildly calcified annulus. Mildly thickened leaflets . - Left atrium: The atrium was mildly dilated. - Technically adequate study.    Assessment and Plan  1. Severe aortic stenosis - patient initially referred for heart murmur evaluation prior to consideration for knee surgery. - echo consistent with severe AS. Exertion limited by knee pain difficult to assess symptoms, however has had episodes of chest pain recently that could be related.  - will refer for cath to evaluate coronaries along with AV study and RHC - in setting of severe AS he is high risk for elective ortho surgery and recommend postponing surgery at this time.    F/u pending cath results  Arnoldo Lenis, M.D.

## 2015-07-21 NOTE — Progress Notes (Signed)
TR BAND REMOVAL  LOCATION: rt   radial  DEFLATED PER PROTOCOL:   yes  TIME BAND OFF / DRESSING APPLIED:    [id number]  SITE UPON ARRIVAL:    Level 0  SITE AFTER BAND REMOVAL:    Level  0  CIRCULATION SENSATION AND MOVEMENT:    Within Normal Limits :  yes  COMMENTS:    Small tegaderm dressing

## 2015-07-21 NOTE — Discharge Instructions (Signed)
Radial Site Care °Refer to this sheet in the next few weeks. These instructions provide you with information about caring for yourself after your procedure. Your health care provider may also give you more specific instructions. Your treatment has been planned according to current medical practices, but problems sometimes occur. Call your health care provider if you have any problems or questions after your procedure. °WHAT TO EXPECT AFTER THE PROCEDURE °After your procedure, it is typical to have the following: °· Bruising at the radial site that usually fades within 1-2 weeks. °· Blood collecting in the tissue (hematoma) that may be painful to the touch. It should usually decrease in size and tenderness within 1-2 weeks. °HOME CARE INSTRUCTIONS °· Take medicines only as directed by your health care provider. °· You may shower 24-48 hours after the procedure or as directed by your health care provider. Remove the bandage (dressing) and gently wash the site with plain soap and water. Pat the area dry with a clean towel. Do not rub the site, because this may cause bleeding. °· Do not take baths, swim, or use a hot tub until your health care provider approves. °· Check your insertion site every day for redness, swelling, or drainage. °· Do not apply powder or lotion to the site. °· Do not flex or bend the affected arm for 24 hours or as directed by your health care provider. °· Do not push or pull heavy objects with the affected arm for 24 hours or as directed by your health care provider. °· Do not lift over 10 lb (4.5 kg) for 5 days after your procedure or as directed by your health care provider. °· Ask your health care provider when it is okay to: °¨ Return to work or school. °¨ Resume usual physical activities or sports. °¨ Resume sexual activity. °· Do not drive home if you are discharged the same day as the procedure. Have someone else drive you. °· You may drive 24 hours after the procedure unless otherwise  instructed by your health care provider. °· Do not operate machinery or power tools for 24 hours after the procedure. °· If your procedure was done as an outpatient procedure, which means that you went home the same day as your procedure, a responsible adult should be with you for the first 24 hours after you arrive home. °· Keep all follow-up visits as directed by your health care provider. This is important. °SEEK MEDICAL CARE IF: °· You have a fever. °· You have chills. °· You have increased bleeding from the radial site. Hold pressure on the site. CALL 911 °SEEK IMMEDIATE MEDICAL CARE IF: °· You have unusual pain at the radial site. °· You have redness, warmth, or swelling at the radial site. °· You have drainage (other than a small amount of blood on the dressing) from the radial site. °· The radial site is bleeding, and the bleeding does not stop after 30 minutes of holding steady pressure on the site. °· Your arm or hand becomes pale, cool, tingly, or numb. °  °This information is not intended to replace advice given to you by your health care provider. Make sure you discuss any questions you have with your health care provider. °  °Document Released: 09/23/2010 Document Revised: 09/11/2014 Document Reviewed: 03/09/2014 °[employer] Interactive Patient Education ©2016 [employer] ° °

## 2015-07-22 ENCOUNTER — Encounter (HOSPITAL_COMMUNITY): Payer: Self-pay | Admitting: Cardiovascular Disease

## 2015-07-22 ENCOUNTER — Telehealth: Payer: Self-pay | Admitting: *Deleted

## 2015-07-22 ENCOUNTER — Telehealth: Payer: Self-pay | Admitting: Cardiology

## 2015-07-22 DIAGNOSIS — I350 Nonrheumatic aortic (valve) stenosis: Secondary | ICD-10-CM

## 2015-07-22 NOTE — Telephone Encounter (Signed)
Pt aware of referral to CT surgery, made 3 month f/u appt with Dr. Harl Bowie. See previous telephone note, orders placed and routed to schedulers

## 2015-07-22 NOTE — Telephone Encounter (Signed)
Orders placed and forwarded to schedulers

## 2015-07-22 NOTE — Telephone Encounter (Signed)
-----   Message from Arnoldo Lenis, MD sent at 07/22/2015 10:39 AM EST ----- Can you refer Mr Delisi to CT surgery for severe aortic stenosis   Zandra Abts MD

## 2015-07-22 NOTE — Telephone Encounter (Signed)
Brent Cobb is calling wanting to know if Dr. Harl Bowie has test results of his cath.

## 2015-08-09 ENCOUNTER — Other Ambulatory Visit: Payer: Self-pay | Admitting: *Deleted

## 2015-08-09 ENCOUNTER — Encounter: Payer: Self-pay | Admitting: Thoracic Surgery (Cardiothoracic Vascular Surgery)

## 2015-08-09 ENCOUNTER — Institutional Professional Consult (permissible substitution) (INDEPENDENT_AMBULATORY_CARE_PROVIDER_SITE_OTHER): Payer: Medicare Other | Admitting: Thoracic Surgery (Cardiothoracic Vascular Surgery)

## 2015-08-09 VITALS — BP 140/74 | HR 104 | Resp 20 | Ht 73.0 in | Wt 270.0 lb

## 2015-08-09 DIAGNOSIS — I350 Nonrheumatic aortic (valve) stenosis: Secondary | ICD-10-CM

## 2015-08-09 DIAGNOSIS — M1712 Unilateral primary osteoarthritis, left knee: Secondary | ICD-10-CM | POA: Insufficient documentation

## 2015-08-09 DIAGNOSIS — I5032 Chronic diastolic (congestive) heart failure: Secondary | ICD-10-CM | POA: Insufficient documentation

## 2015-08-09 DIAGNOSIS — I8390 Asymptomatic varicose veins of unspecified lower extremity: Secondary | ICD-10-CM | POA: Insufficient documentation

## 2015-08-09 DIAGNOSIS — E669 Obesity, unspecified: Secondary | ICD-10-CM | POA: Insufficient documentation

## 2015-08-09 DIAGNOSIS — E119 Type 2 diabetes mellitus without complications: Secondary | ICD-10-CM | POA: Insufficient documentation

## 2015-08-09 DIAGNOSIS — G4733 Obstructive sleep apnea (adult) (pediatric): Secondary | ICD-10-CM | POA: Insufficient documentation

## 2015-08-09 NOTE — Progress Notes (Signed)
[address]       [place] [address]             [place] REPORT  Referring Provider is Branch, Alphonse Guild, MD PCP is Emelda Fear, DO  Chief Complaint  Patient presents with  . Aortic Stenosis    Surgical eval, Cardiac Cath 07/21/15, ECHO 07/07/15    HPI:  Patient is a 69 year old obese white male with history of obesity, type 2 diabetes mellitus, obstructive sleep apnea, and severe degenerative arthritis of the left knee who has recently been discovered to have severe aortic stenosis and has been referred for surgical consultation to discuss treatment options.  The patient describes a long-standing history of symptoms of exertional shortness of breath and intermittent chest tightness that has gradually progressed over the past 2 years. He has been struggling with chronic pain in his left knee related to degenerative arthritis that has severely limited his physical mobility over the past 3 years or more. He was recently evaluated by an orthopedic surgeon and plans were in place to proceed with knee replacement. However, the patient was noted to have a systolic murmur on physical exam and referred for cardiology clearance. The patient was evaluated by Dr. Harl Bowie and underwent a transthoracic echocardiogram on 07/07/2015. Peak velocity across the aortic valve measured 3.8 m/s corresponding to mean transvalvular gradient estimated 42 mmHg and aortic valve area calculated 0.96 cm. Left ventricular systolic function remains preserved with ejection fraction estimated 60-65%. There was severe left ventricular hypertrophy with significant diastolic dysfunction. The patient subsequently underwent left and right heart catheterization by Dr. Burt Knack on 07/21/2015. The patient was found to have moderate nonobstructive coronary artery disease. The patient was referred for surgical consultation.  The patient is married and lives with his  wife in [place]. 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 The patient states that he remain physically active all of his life until he began to experience worsening problems with chronic pain in his left knee area for the last 3 years he has been fairly sedentary. He walks using a cane when he has to walk longer distances, but around the house he does not require any physical assistance. He is limited both by chronic pain in his left knee as well as symptoms of exertional shortness of breath. He states that he gets short of breath with moderate physical activity. He denies any history of resting shortness of breath, PND, orthopnea, or syncope. He has occasional dizzy spells and mild to moderate chronic bilateral lower extremity edema. He also describes intermittent symptoms of tightness across his left chest. The symptoms are not necessarily related to physical activity didn't seem to calm and go sporadically.   Past Medical History  Diagnosis Date  . Diabetes mellitus ([place])   . Severe aortic stenosis 07/21/2015  . Type II diabetes mellitus (H[facility])     Insulin-dependent  . Chronic diastolic congestive heart failure (HCC)     NYHA functional class II  . Obesity   . Obstructive sleep apnea     Intolerant of CPAP  . Varicose veins     Past Surgical History  Procedure Laterality Date  . Polypectomy  03/21/2010  . Cervical spine surgery      30 years ago and last time 10 years ago  . Skin cancer excision  1991    removal of skin  cancer to right cheek area  . Knee surgery Left 2013  . Shoulder surgery Left 2013  . Cardiac catheterization N/A 07/21/2015    Procedure: Right/Left Heart Cath and Coronary Angiography;  Surgeon: Sherren Mocha, MD;  Location: [facility];  Service: Cardiovascular;  Laterality: N/A;    Family History  Problem Relation Age of Onset  . Heart attack Mother   . Alcoholism Father     . Diabetes Brother   . Valvular heart disease Sister     Social History   Social History  . Marital Status: Married    Spouse Name: N/A  . Number of Children: N/A  . Years of Education: N/A   Occupational History  . Not on file.   Social History Main Topics  . Smoking status: Former Smoker -- 1.50 packs/day for 33 years    Types: Cigarettes    Start date: 06/21/1957    Quit date: 09/04/1989  . Smokeless tobacco: Former Systems developer    Quit date: 09/04/1969  . Alcohol Use: Not on file  . Drug Use: Not on file  . Sexual Activity: Not on file   Other Topics Concern  . Not on file   Social History Narrative    Current Outpatient Prescriptions  Medication Sig Dispense Refill  . acetaminophen (TYLENOL) 650 MG CR tablet Take 1,300 mg by mouth every 8 (eight) hours as needed for pain.    Marland Kitchen ALPRAZolam (XANAX) 0.5 MG tablet Take 0.5 mg by mouth at bedtime.    Marland Kitchen aspirin EC 81 MG tablet Take 81 mg by mouth daily.    . cholecalciferol (VITAMIN D) 1000 UNITS tablet Take 1,000 Units by mouth daily.    . insulin NPH Human (HUMULIN N,NOVOLIN N) 100 UNIT/ML injection Inject 30 Units into the skin 3 (three) times daily.    . Liraglutide (VICTOZA) 18 MG/3ML SOPN Inject 20 mLs into the skin daily.     Marland Kitchen losartan (COZAAR) 100 MG tablet Take 100 mg by mouth daily.    . meloxicam (MOBIC) 7.5 MG tablet Take 7.5 mg by mouth daily.    . sitaGLIPtin-metformin (JANUMET) 50-1000 MG tablet Take 1 tablet by mouth 2 (two) times daily with a meal.    . VITAMIN E PO Take 1 capsule by mouth daily.     No current facility-administered medications for this visit.    No Known Allergies    Review of Systems:   General:  normal appetite, decreased energy, + weight gain, no weight loss, no fever  Cardiac:  + chest pain with exertion, + chest pain at rest, + SOB with exertion, no resting SOB, no PND, no orthopnea, no palpitations, no arrhythmia, no atrial fibrillation, + LE edema, + dizzy spells, no  syncope  Respiratory:  + exertional shortness of breath, no home oxygen, + chronic productive cough, + dry cough, no bronchitis, no wheezing, no hemoptysis, no asthma, no pain with inspiration or cough, + sleep apnea, does not use CPAP at night  GI:   no difficulty swallowing, occasional reflux, no frequent heartburn, no hiatal hernia, no abdominal pain, occasional constipation, no diarrhea, no hematochezia, no hematemesis, no melena  GU:   no dysuria,  no frequency, no urinary tract infection, no hematuria, no enlarged prostate, no kidney stones, no kidney disease  Vascular:  no pain suggestive of claudication, no pain in feet, no leg cramps, + varicose veins, no DVT, no non-healing foot ulcer  Neuro:   no stroke, no TIA's, no seizures, no  headaches, no temporary blindness one eye,  no slurred speech, + peripheral neuropathy, + chronic pain, + instability of gait, no memory/cognitive dysfunction  Musculoskeletal: + arthritis, + joint swelling, no myalgias, + difficulty walking, decreased mobility   Skin:   no rash, no itching, no skin infections, no pressure sores or ulcerations  Psych:   no anxiety, no depression, no nervousness, no unusual recent stress  Eyes:   no blurry vision, no floaters, no recent vision changes, + wears glasses or contacts  ENT:   + hearing loss, no loose or painful teeth, no dentures, last saw dentist several years ago  Hematologic:  no easy bruising, no abnormal bleeding, no clotting disorder, no frequent epistaxis  Endocrine:  + diabetes, does check CBG's at home, most recent hemoglobin a1c reportedly 8.0     Physical Exam:   BP 140/74 mmHg  Pulse 104  Resp 20  Ht 6\' 1"  (1.854 m)  Wt 270 lb (122.471 kg)  BMI 35.63 kg/m2  SpO2 95%  General:  Obese but o/w  well-appearing  HEENT:  Unremarkable   Neck:   no JVD, no bruits, no adenopathy   Chest:   clear to auscultation, symmetrical breath sounds, no wheezes, no rhonchi   CV:   RRR, grade III/VI systolic murmur  best along sternal border  Abdomen:  soft, non-tender, no masses, + rectus diastasis  Extremities:  warm, well-perfused, pulses diminished, + mild LE edema  Rectal/GU  Deferred  Neuro:   Grossly non-focal and symmetrical throughout  Skin:   Clean and dry, no rashes, no breakdown   Diagnostic Tests:  Transthoracic Echocardiography  Patient:  Jabier, [place] MR #:    [id number] Study Date: 07/07/2015 Gender:   M Age:    19 Height:   185.4 cm Weight:   124.3 kg BSA:    2.57 m^2 Pt. Status: Room:  ATTENDING  Kerry Hough, M.D. Berna Spare, M.D. REFERRING  Kerry Hough, M.D. PERFORMING  [facility], [facility] SONOGRAPHER Alvino Chapel, RCS  cc:  ------------------------------------------------------------------- LV EF: 60% -  65%  ------------------------------------------------------------------- Indications:   Murmur 785.2.  ------------------------------------------------------------------- Study Conclusions  - Left ventricle: The cavity size was normal. Wall thickness was increased in a pattern of severe LVH. Systolic function was normal. The estimated ejection fraction was in the range of 60% to 65%. Wall motion was normal; there were no regional wall motion abnormalities. Doppler parameters are consistent with abnormal left ventricular relaxation (grade 1 diastolic dysfunction). - Aortic valve: Severely calcified annulus. Trileaflet; severely thickened leaflets. There was severe stenosis. The contour of the spectral Doppler waveform may lead to underestimation of severity. Valve area (VTI): 1.01 cm^2. Valve area (Vmax): 0.96 cm^2. Valve area (Vmean): 0.79 cm^2. - Mitral valve: Mildly calcified annulus. Mildly thickened leaflets . - Left atrium: The atrium was mildly dilated. - Technically adequate study.  Transthoracic echocardiography. M-mode, complete 2D, spectral Doppler, and color  Doppler. Birthdate: Patient birthdate: 16-Jun-1946. Age: Patient is 69 yr old. Sex: Gender: male. BMI: 36.2 kg/m^2. Blood pressure:   103/87 Patient status: Inpatient. Study date: Study date: 07/07/2015. Study time: 10:05 AM. Location: Echo laboratory.  -------------------------------------------------------------------  ------------------------------------------------------------------- Left ventricle: The cavity size was normal. Wall thickness was increased in a pattern of severe LVH. Systolic function was normal. The estimated ejection fraction was in the range of 60% to 65%. Wall motion was normal; there were no regional wall motion abnormalities. Doppler parameters are consistent with abnormal left ventricular relaxation (grade 1 diastolic dysfunction).  -------------------------------------------------------------------  Aortic valve:  Severely calcified annulus. Trileaflet; severely thickened leaflets. Doppler:  There was severe stenosis.  The contour of the spectral Doppler waveform may lead to underestimation of severity. There was no significant regurgitation.  VTI ratio of LVOT to aortic valve: 0.29. Valve area (VTI): 1.01 cm^2. Indexed valve area (VTI): 0.39 cm^2/m^2. Peak velocity ratio of LVOT to aortic valve: 0.28. Valve area (Vmax): 0.96 cm^2. Indexed valve area (Vmax): 0.37 cm^2/m^2. Mean velocity ratio of LVOT to aortic valve: 0.23. Valve area (Vmean): 0.79 cm^2. Indexed valve area (Vmean): 0.31 cm^2/m^2.  Mean gradient (S): 42 mm Hg. Peak gradient (S): 59 mm Hg.  ------------------------------------------------------------------- Aorta: Aortic root: The aortic root was normal in size.  ------------------------------------------------------------------- Mitral valve:  Mildly calcified annulus. Mildly thickened leaflets . Doppler:  There was no evidence for stenosis.  There was no significant  regurgitation.  ------------------------------------------------------------------- Left atrium: The atrium was mildly dilated.  ------------------------------------------------------------------- Right ventricle: The cavity size was normal. Systolic function was normal.  ------------------------------------------------------------------- Pulmonic valve:  Not well visualized. Doppler:  There was no evidence for stenosis.  There was no significant regurgitation.  ------------------------------------------------------------------- Tricuspid valve:  Normal thickness leaflets. Doppler:  There was no evidence for stenosis.  There was no significant regurgitation.  ------------------------------------------------------------------- Pulmonary artery:  Systolic pressure could not be accurately estimated.  Inadequate TR jet.  ------------------------------------------------------------------- Right atrium: The atrium was normal in size.  ------------------------------------------------------------------- Pericardium: There was no pericardial effusion.  ------------------------------------------------------------------- Systemic veins: Inferior vena cava: The vessel was normal in size. The respirophasic diameter changes were in the normal range (>= 50%), consistent with normal central venous pressure.  ------------------------------------------------------------------- Measurements  Left ventricle              Value      Reference LV ID, ED, PLAX chordal         46.9  mm    43 - 52 LV ID, ES, PLAX chordal         31.7  mm    23 - 38 LV fx shortening, PLAX chordal      32   %    >=29 LV PW thickness, ED           15   mm    --------- IVS/LV PW ratio, ED           1        <=1.3  Ventricular septum            Value      Reference IVS thickness, ED             15   mm    ---------  LVOT                   Value      Reference LVOT ID, S                21   mm    --------- LVOT area                3.46  cm^2   --------- LVOT peak velocity, S          106  cm/s   --------- LVOT mean velocity, S          71.8  cm/s   --------- LVOT VTI, S               23   cm    --------- LVOT peak gradient, S  4   mm Hg  ---------  Aortic valve               Value      Reference Aortic valve peak velocity, S      384.44 cm/s   --------- Aortic valve mean velocity, S      315.27 cm/s   --------- Aortic valve VTI, S           78.82 cm    --------- Aortic mean gradient, S         42   mm Hg  --------- Aortic peak gradient, S         59   mm Hg  --------- VTI ratio, LVOT/AV            0.29      --------- Aortic valve area, VTI          1.01  cm^2   --------- Aortic valve area/bsa, VTI        0.39  cm^2/m^2 --------- Velocity ratio, peak, LVOT/AV      0.28      --------- Aortic valve area, peak velocity     0.96  cm^2   --------- Aortic valve area/bsa, peak       0.37  cm^2/m^2 --------- velocity Velocity ratio, mean, LVOT/AV      0.23      --------- Aortic valve area, mean velocity     0.79  cm^2   --------- Aortic valve area/bsa, mean       0.31  cm^2/m^2 --------- velocity  Aorta                  Value      Reference Aortic root ID, ED            29   mm    ---------  Left atrium               Value      Reference LA ID, A-P, ES              54   mm    --------- LA volume/bsa, S             28.7  ml/m^2  ---------  Mitral  valve               Value      Reference Mitral E-wave peak velocity       66.4  cm/s   --------- Mitral A-wave peak velocity       97.3  cm/s   --------- Mitral deceleration time     (H)   362  ms    150 - 230 Mitral E/A ratio, peak          0.7       ---------  Right ventricle             Value      Reference TAPSE                  17   mm    ---------  Legend: (L) and (H) mark values outside specified reference range.  ------------------------------------------------------------------- Prepared and Electronically Authenticated by  Kerry Hough, M.D. 2016-11-02T14:55:54    CARDIAC CATHETERIZATION  Procedures    Right/Left Heart Cath and Coronary Angiography    Conclusion    1. Known severe aortic stenosis by echo criteria 2. Distal vessel CAD, likely most appropriate for medical therapy  TCTS consult for aortic valve replacement  Indications    Aortic valve stenosis, severe [I35.0 (ICD-10-CM)]    Technique and Indications    INDICATION: Severe aortic stenosis  PROCEDURAL DETAILS: There was an indwelling IV in a right antecubital vein. Using normal sterile technique, the IV was changed out for a 5 Fr brachial sheath over a 0.018 inch wire. The right wrist was then prepped, draped, and anesthetized with 1% lidocaine. Using the modified Seldinger technique a 5/6 French Slender sheath was placed in the right radial artery. Intra-arterial verapamil was administered through the radial artery sheath. IV heparin was administered after a JR4 catheter was advanced into the central aorta. A Swan-Ganz catheter was used for the right heart catheterization. Standard protocol was followed for recording of right heart pressures and sampling of oxygen saturations. Fick cardiac output was calculated. Standard Judkins catheters were used for selective coronary  angiography. An AR-1 catheter is used for injection of the RCA. I attempted to cross the aortic valve with an AR-1, pigtail, and J-wire without success. The angulation of the innominate into the aorta was unfavorable for crossing the aortic valve. There were no immediate procedural complications. The patient was transferred to the post catheterization recovery area for further monitoring.   Estimated blood loss <50 mL. There were no immediate complications during the procedure.    Coronary Findings    Dominance: Left   Left Main   . LM lesion, 30% stenosed.     Left Circumflex   . Mid Cx lesion, 25% stenosed.   . Second Obtuse Marginal Branch   The vessel is small in size. There is moderate disease in the vessel.   . Third Obtuse Marginal Branch   The vessel is small in size. There is moderate disease in the vessel.   . First Left Posterolateral Branch   . 1st LPL lesion, 75% stenosed.   . Third Left Posterolateral Branch   . 3rd LPL lesion, 80% stenosed. Small vessel     Right Coronary Artery  Patent, nondominant RCA      Coronary Diagrams    Diagnostic Diagram            Implants    Name ID Temporary Type Supply   No information to display    PACS Images    Show images for Cardiac catheterization     Link to Procedure Log    Procedure Log      Hemo Data       Most Recent Value   Fick Cardiac Output  5.51 L/min   Fick Cardiac Output Index  2.29 (L/min)/BSA   RA A Wave  6 mmHg   RA V Wave  6 mmHg   RA Mean  4 mmHg   RV Systolic Pressure  34 mmHg   RV Diastolic Pressure  1 mmHg   RV EDP  8 mmHg   PA Systolic Pressure  32 mmHg   PA Diastolic Pressure  10 mmHg   PA Mean  21 mmHg   PW A Wave  11 mmHg   PW V Wave  9 mmHg   PW Mean  8 mmHg   AO Systolic Pressure  [id number] mmHg   AO Diastolic Pressure  68 mmHg   AO Mean  97 mmHg   QP/QS  1   TPVR Index  9.19 HRUI   TSVR Index  42.44 HRUI   PVR SVR Ratio  0.14   TPVR/TSVR  Ratio  0.22     STS Risk Calculator  Procedure  AVR  Risk of Mortality   2.1% Morbidity or Mortality  17.3% Prolonged LOS   7.6% Short LOS    35.4% Permanent Stroke   0.8% Prolonged Vent Support  10.5% DSW Infection    0.8% Renal Failure    5.4% Reoperation    7.1%    Impression:  Patient has stage D severe symptomatic aortic stenosis. I have personally reviewed the patient's recent transthoracic echocardiogram and diagnostic cardiac catheterization.  Echocardiogram demonstrates the presence of a tricuspid aortic valve with severe thickening and restricted leaflet mobility involving all 3 leaflets. Peak velocity across the aortic valve measures between 3.8 and 4.0 m/s corresponding to mean transvalvular gradient estimated 42 mmHg. Left ventricular systolic function remains normal. There is severe left ventricular hypertrophy and likely significant diastolic dysfunction.   Diagnostic cardiac catheterization is notable for the presence of left-dominant circulation with 75-80% stenosis of 2 relatively small terminal branches of the distal left circumflex coronary artery but otherwise nonobstructive coronary artery disease. Pulmonary artery pressures were only mildly elevated.  I agree that the patient would best be treated with aortic valve replacement.  Risks associated with conventional surgical aortic valve replacement should be relatively low and increased only because of the patient's obesity and somewhat limited physical mobility. He might be an appropriate candidate for minimally invasive approach for surgery.    Plan:  The patient and his family were counseled at length regarding treatment alternatives for management of severe aortic stenosis including continued medical therapy versus proceeding with aortic valve replacement in the near future.  The natural history of aortic stenosis was reviewed, as was long term prognosis with medical therapy alone.  Surgical options were  discussed at length including conventional surgical aortic valve replacement using either a mechanical prosthesis or a bioprosthetic tissue valve through either a conventional median sternotomy or using minimally invasive techniques.  Other alternatives including transcatheter aortic valve replacement were discussed.  Discussion was held comparing the relative risks of mechanical valve replacement with need for lifelong anticoagulation versus use of a bioprosthetic tissue valve and the associated potential for late structural valve deterioration and failure.  This discussion was placed in the context of the patient's particular circumstances, and as a result the patient specifically requests that their valve be replaced using a bioprosthetic tissue valve.  He is hopeful that he might be a candidate for minimally invasive approach for surgery.  The patient understands and accepts all potential associated risks of surgery including but not limited to risk of death, stroke, myocardial infarction, congestive heart failure, respiratory failure, renal failure, pneumonia, bleeding requiring blood transfusion and or reexploration, arrhythmia, heart block or bradycardia requiring permanent pacemaker, aortic dissection or other major vascular complication, pleural effusions or other delayed complications related to continued congestive heart failure, and other late complications related to valve replacement including structural valve deterioration and failure, thrombosis, endocarditis, or paravalvular leak.  We plan to obtain cardiac gated CT angiogram of the heart and CT angiogram of the chest, abdomen, and pelvis to further characterize whether or not the patient might be a good candidate for minimally invasive approach for aortic valve replacement, potentially using a rapid deployment sutureless aortic valve. We tentatively plan to proceed with surgery on Wednesday, 08/18/2015. The patient will return for follow-up on  Monday, 08/16/2015 to review the results of his CT scans and make final plans for surgery.     I spent in excess of 90 minutes during the conduct of this office consultation and >50% of  this time involved direct face-to-face encounter with the patient for counseling and/or coordination of their care.    Valentina Gu. Roxy Manns, MD 08/09/2015 10:49 AM

## 2015-08-09 NOTE — Patient Instructions (Signed)
Continue all previous medications without any changes at this time  Make every effort to keep your diabetes under very tight control.  Follow up closely with your primary care physician or endocrinologist and strive to keep their hemoglobin A1c levels as low as possible, preferably near or below 6.0.  The long term benefits of strict control of diabetes are far reaching and critically important for your overall health and survival.

## 2015-08-12 ENCOUNTER — Ambulatory Visit (HOSPITAL_COMMUNITY)
Admission: RE | Admit: 2015-08-12 | Discharge: 2015-08-12 | Disposition: A | Discharge status: Home / Self Care | Payer: Medicare Other | Source: Ambulatory Visit | Attending: Thoracic Surgery (Cardiothoracic Vascular Surgery) | Admitting: Thoracic Surgery (Cardiothoracic Vascular Surgery)

## 2015-08-12 ENCOUNTER — Encounter (HOSPITAL_COMMUNITY): Payer: Self-pay

## 2015-08-12 ENCOUNTER — Ambulatory Visit (HOSPITAL_COMMUNITY): Payer: Medicare Other

## 2015-08-12 DIAGNOSIS — I517 Cardiomegaly: Secondary | ICD-10-CM | POA: Diagnosis not present

## 2015-08-12 DIAGNOSIS — I350 Nonrheumatic aortic (valve) stenosis: Secondary | ICD-10-CM | POA: Insufficient documentation

## 2015-08-12 DIAGNOSIS — R932 Abnormal findings on diagnostic imaging of liver and biliary tract: Secondary | ICD-10-CM | POA: Insufficient documentation

## 2015-08-12 DIAGNOSIS — R918 Other nonspecific abnormal finding of lung field: Secondary | ICD-10-CM | POA: Insufficient documentation

## 2015-08-12 DIAGNOSIS — I771 Stricture of artery: Secondary | ICD-10-CM | POA: Insufficient documentation

## 2015-08-12 DIAGNOSIS — I358 Other nonrheumatic aortic valve disorders: Secondary | ICD-10-CM | POA: Diagnosis not present

## 2015-08-12 IMAGING — CT CT HEART MORP W/ CTA COR W/ SCORE W/ CA W/CM &/OR W/O CM
1 of 14 series · 1 of 19 positions shown, 2 images · IV contrast (Iodine)
Comparison: none

CLINICAL DATA: Aortic stenosis

EXAM:
Cardiac TAVR CT
TECHNIQUE: The patient was scanned on a Philips 256 scanner. A 120 kV
retrospective scan was triggered in the descending thoracic aorta at
111 HU's. Gantry rotation speed was 270 msecs and collimation was .9
mm. No beta blockade or nitro were given. The 3D data set was
reconstructed in 5% intervals of the R-R cycle. Systolic and
diastolic phases were analyzed on a dedicated work station using
MPR, MIP and VRT modes. The patient received 80 cc of contrast.

[Series 200: locator · axial · 0.35mm/px · z∈[-172,-172]mm · 1 of 1 slices shown, 2 images]
[im 1/1  vessel]
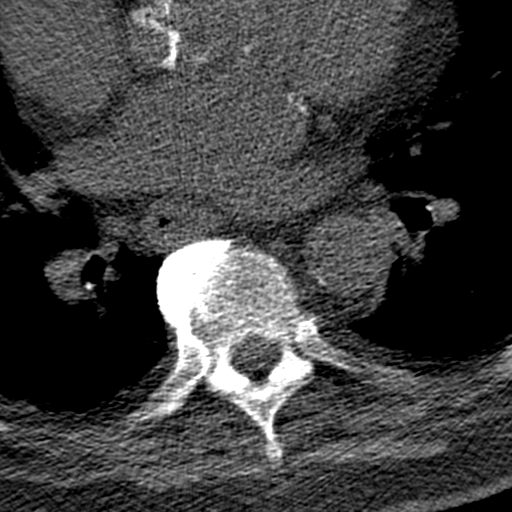
[im 1/1  lung]
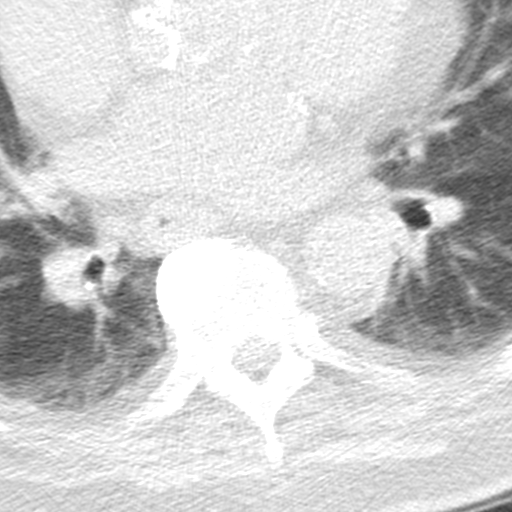

[1 of 19 positions shown; findings below may reference images not displayed]

FINDINGS: Aortic Valve: Trileaflet and calcified No significant annular
calcification

Aorta: Nor dissection or atheroma No contraindications to
percutaneous delivery

Sinotubular Junction:  31 mm

Ascending Thoracic Aorta:  36 mm

Descending Thoracic Aorta:  28 mm

Sinus of Valsalva Measurements:

Non-coronary:  34 mm

Right -coronary:  33 mm

Left -coronary:  33 mm

Coronary Artery Height above Annulus:

Left Main:  17 mm

Right Coronary:  15 mm

Virtual Basal Annulus Measurements:

Maximum/Minimum Diameter:  30.4 x 25.5 mm

Perimeter:  88 mm

Area:  628 mm2

Coronary Arteries:  Sufficient height above annulus for delivery

Optimum Fluoroscopic Angle for Delivery: RAO 15 degrees Cranial 0
degrees
IMPRESSION: 1) Calcified trileaflet aortic valve Annular Area of 628 mm2
suitable for a 29 mm Sapien 3 valve

2) Coronary Artery heights suitable for delivery

3) No aortic contraindications to percutaneous delivery

4) No KLEVER Thrombus

5) Optimum angle for delivery RAO 15 degrees Cranial 0 degrees

Don Lolito Onacram

## 2015-08-12 MED ORDER — IOHEXOL 350 MG/ML SOLN
70.0000 mL | Freq: Once | INTRAVENOUS | Status: AC | PRN
  Administered 2015-08-12: 100 mL via INTRAVENOUS

## 2015-08-12 MED ORDER — IOHEXOL 350 MG/ML SOLN
80.0000 mL | Freq: Once | INTRAVENOUS | Status: AC | PRN
  Administered 2015-08-12: 100 mL via INTRAVENOUS

## 2015-08-14 NOTE — Pre-Procedure Instructions (Signed)
Brent Cobb  08/14/2015       [facility] - [place] [address] [place] [address] Phone: [phone] Fax: [phone]  [facility] [place], [place] [address] Phone: [phone] Fax: [phone]    Your procedure is scheduled on 08/18/2015.  Report to [facility] Admitting at 6:30 A.M.  Call this number if you have problems the morning of surgery:  [phone]   Remember:  Do not eat food or drink liquids after midnight.   On TUESDAY  Take these medicines the morning of surgery with A SIP OF WATER:    Stop taking aspirin, Ibupforfen, BC's, Goody's, Herbal medications, Fish oil, Meloxicam (Mobic)   Do not wear jewelry   Do not wear lotions, powders, or perfumes.                Men may shave face and neck.   Do not bring valuables to the hospital.   [facility] is not responsible for any belongings or valuables.  Contacts, dentures or bridgework may not be worn into surgery.  Leave your suitcase in the car.  After surgery it may be brought to your room.  For patients admitted to the hospital, discharge time will be determined by your treatment team.  Patients discharged the day of surgery will not be allowed to drive home.   Name and phone number of your driver:     Special instructions:    Special Instructions:   [facility] - Preparing for Surgery  Before surgery, you can play an important role.  Because skin is not sterile, your skin needs to be as free of germs as possible.  You can reduce the number of germs on you skin by washing with CHG (chlorahexidine gluconate) soap before surgery.  CHG is an antiseptic cleaner which kills germs and bonds with the skin to continue killing germs even after washing.  Please DO NOT use if you have an allergy to CHG or antibacterial soaps.  If your skin becomes reddened/irritated  stop using the CHG and inform your nurse when you arrive at Short Stay.  Do not shave (including legs and underarms) for at least 48 hours prior to the first CHG shower.  You may shave your face.  Please follow these instructions carefully:   1.  Shower with CHG Soap the night before surgery and the  morning of Surgery.  2.  If you choose to wash your hair, wash your hair first as usual with your  normal shampoo.  3.  After you shampoo, rinse your hair and body thoroughly to remove the  Shampoo.  4.  Use CHG as you would any other liquid soap.  You can apply chg directly to the skin and wash gently with scrungie or a clean washcloth.  5.  Apply the CHG Soap to your body ONLY FROM THE NECK DOWN.    Do not use on open wounds or open sores.  Avoid contact with your eyes, ears, mouth and genitals (private parts).  Wash genitals (private parts)   with your normal soap.  6.  Wash thoroughly, paying special attention to the area where your surgery will be performed.  7.  Thoroughly rinse your body with warm water from the neck down.  8.  DO NOT shower/wash with your normal soap after using and rinsing off   the CHG Soap.  9.  Pat yourself dry with a clean towel.            10.  Wear clean pajamas.            11.  Place clean sheets on your bed the night of your first shower and do not sleep with pets.  Day of Surgery  Do not apply any lotions/deodorants the morning of surgery.  Please wear clean clothes to the hospital/surgery center.  How to Manage Your Diabetes Before Surgery   Why is it important to control my blood sugar before and after surgery?   Improving blood sugar levels before and after surgery helps healing and can limit problems.  A way of improving blood sugar control is eating a healthy diet by:  - Eating less sugar and carbohydrates  - Increasing activity/exercise  - Talk with your doctor about reaching your blood sugar goals  High blood sugars (greater than 180 mg/dL) can  raise your risk of infections and slow down your recovery so you will need to focus on controlling your diabetes during the weeks before surgery.  Make sure that the doctor who takes care of your diabetes knows about your planned surgery including the date and location.  How do I manage my blood sugars before surgery?   Check your blood sugar at least 4 times a day, 2 days before surgery to make sure that they are not too high or low.   Check your blood sugar the morning of your surgery when you wake up and every 2               hours until you get to the Short-Stay unit.  If your blood sugar is less than 70 mg/dL, you will need to treat for low blood sugar by:  Treat a low blood sugar (less than 70 mg/dL) with 1/2 cup of clear juice (cranberry or apple), 4 glucose tablets, OR glucose gel.  Recheck blood sugar in 15 minutes after treatment (to make sure it is greater than 70 mg/dL).  If blood sugar is not greater than 70 mg/dL on re-check, call [phone] for further instructions.   Report your blood sugar to the Short-Stay nurse when you get to Short-Stay.  References:  [facility], 2007 "How to Manage your Diabetes Before and After Surgery".  What do I do about my diabetes medications?   Do not take oral diabetes medicines (pills) the morning of surgery.    THE NIGHT BEFORE SURGERY, take 21     units of    NPH Insulin.    THE MORNING OF SURGERY, take  15  units of  NPH  Insulin.    Do not take other diabetes injectables the day of surgery .....Marland Kitchensuch as your  Victoza    If your CBG is greater than 220 mg/dL, you may take 1/2 of your sliding scale (correction) dose of insulin.           Please read over the following fact sheets that you were given. Pain Booklet, Coughing and Deep Breathing, Blood Transfusion Information, Open Heart Packet, MRSA Information and Surgical Site Infection Prevention

## 2015-08-16 ENCOUNTER — Ambulatory Visit (HOSPITAL_COMMUNITY)
Admission: RE | Admit: 2015-08-16 | Discharge: 2015-08-16 | Disposition: A | Discharge status: Home / Self Care | Payer: Medicare Other | Source: Ambulatory Visit | Attending: Thoracic Surgery (Cardiothoracic Vascular Surgery) | Admitting: Thoracic Surgery (Cardiothoracic Vascular Surgery)

## 2015-08-16 ENCOUNTER — Encounter (HOSPITAL_COMMUNITY): Payer: Self-pay

## 2015-08-16 ENCOUNTER — Encounter (HOSPITAL_COMMUNITY)
Admission: RE | Admit: 2015-08-16 | Discharge: 2015-08-16 | Disposition: A | Discharge status: Home / Self Care | Payer: Medicare Other | Source: Ambulatory Visit | Attending: Thoracic Surgery (Cardiothoracic Vascular Surgery) | Admitting: Thoracic Surgery (Cardiothoracic Vascular Surgery)

## 2015-08-16 ENCOUNTER — Encounter: Payer: Self-pay | Admitting: Thoracic Surgery (Cardiothoracic Vascular Surgery)

## 2015-08-16 ENCOUNTER — Ambulatory Visit (INDEPENDENT_AMBULATORY_CARE_PROVIDER_SITE_OTHER): Payer: Medicare Other | Admitting: Thoracic Surgery (Cardiothoracic Vascular Surgery)

## 2015-08-16 ENCOUNTER — Ambulatory Visit (HOSPITAL_BASED_OUTPATIENT_CLINIC_OR_DEPARTMENT_OTHER)
Admission: RE | Admit: 2015-08-16 | Discharge: 2015-08-16 | Disposition: A | Discharge status: Home / Self Care | Payer: Medicare Other | Source: Ambulatory Visit | Attending: Thoracic Surgery (Cardiothoracic Vascular Surgery) | Admitting: Thoracic Surgery (Cardiothoracic Vascular Surgery)

## 2015-08-16 ENCOUNTER — Other Ambulatory Visit: Payer: Self-pay | Admitting: *Deleted

## 2015-08-16 VITALS — BP 122/63 | HR 88 | Temp 98.1°F | Resp 20 | Ht 73.0 in | Wt 269.4 lb

## 2015-08-16 VITALS — BP 127/69 | HR 92 | Resp 20 | Ht 73.0 in | Wt 269.0 lb

## 2015-08-16 DIAGNOSIS — I350 Nonrheumatic aortic (valve) stenosis: Secondary | ICD-10-CM

## 2015-08-16 HISTORY — DX: Unspecified osteoarthritis, unspecified site: M19.90

## 2015-08-16 HISTORY — DX: Malignant (primary) neoplasm, unspecified: C80.1

## 2015-08-16 HISTORY — DX: Unspecified asthma, uncomplicated: J45.909

## 2015-08-16 HISTORY — DX: Cardiac murmur, unspecified: R01.1

## 2015-08-16 LAB — PULMONARY FUNCTION TEST
DL/VA % pred: 110 %
DL/VA: 5.08 ml/min/mmHg/L
DLCO UNC % PRED: 70 %
DLCO UNC: 22.82 ml/min/mmHg
FEF 25-75 Post: 1.51 L/sec
FEF 25-75 Pre: 1.78 L/sec
FEF2575-%CHANGE-POST: -14 %
FEF2575-%PRED-PRE: 73 %
FEF2575-%Pred-Post: 62 %
FEV1-%CHANGE-POST: 2 %
FEV1-%PRED-POST: 64 %
FEV1-%PRED-PRE: 62 %
FEV1-POST: 2.06 L
FEV1-Pre: 2.02 L
FEV1FVC-%CHANGE-POST: 7 %
FEV1FVC-%Pred-Pre: 97 %
FEV6-%Change-Post: 2 %
FEV6-%PRED-PRE: 63 %
FEV6-%Pred-Post: 65 %
FEV6-PRE: 2.62 L
FEV6-Post: 2.69 L
FEV6FVC-%PRED-PRE: 106 %
FEV6FVC-%Pred-Post: 106 %
FVC-%Change-Post: -4 %
FVC-%PRED-PRE: 64 %
FVC-%Pred-Post: 61 %
FVC-POST: 2.69 L
FVC-PRE: 2.83 L
POST FEV1/FVC RATIO: 76 %
POST FEV6/FVC RATIO: 100 %
PRE FEV6/FVC RATIO: 100 %
Pre FEV1/FVC ratio: 71 %
RV % PRED: 112 %
RV: 2.77 L
TLC % PRED: 83 %
TLC: 5.85 L

## 2015-08-16 LAB — URINALYSIS, ROUTINE W REFLEX MICROSCOPIC
Bilirubin Urine: NEGATIVE
Glucose, UA: NEGATIVE mg/dL
Hgb urine dipstick: NEGATIVE
KETONES UR: NEGATIVE mg/dL
LEUKOCYTES UA: NEGATIVE
NITRITE: NEGATIVE
PROTEIN: NEGATIVE mg/dL
Specific Gravity, Urine: 1.012 (ref 1.005–1.030)
pH: 5 (ref 5.0–8.0)

## 2015-08-16 LAB — BLOOD GAS, ARTERIAL
ACID-BASE DEFICIT: 3.5 mmol/L — AB (ref 0.0–2.0)
BICARBONATE: 21 meq/L (ref 20.0–24.0)
DRAWN BY: 42180
FIO2: 0.21
O2 SAT: 93.1 %
PATIENT TEMPERATURE: 98.6
TCO2: 22.1 mmol/L (ref 0–100)
pCO2 arterial: 37.7 mmHg (ref 35.0–45.0)
pH, Arterial: 7.364 (ref 7.350–7.450)
pO2, Arterial: 84.2 mmHg (ref 80.0–100.0)

## 2015-08-16 LAB — COMPREHENSIVE METABOLIC PANEL
ALBUMIN: 4 g/dL (ref 3.5–5.0)
ALK PHOS: 139 U/L — AB (ref 38–126)
ALT: 14 U/L — ABNORMAL LOW (ref 17–63)
ANION GAP: 11 (ref 5–15)
AST: 19 U/L (ref 15–41)
BUN: 23 mg/dL — ABNORMAL HIGH (ref 6–20)
CALCIUM: 9.2 mg/dL (ref 8.9–10.3)
CHLORIDE: 107 mmol/L (ref 101–111)
CO2: 20 mmol/L — AB (ref 22–32)
Creatinine, Ser: 1.11 mg/dL (ref 0.61–1.24)
GFR calc Af Amer: >60 mL/min (ref >60)
GFR calc non Af Amer: >60 mL/min (ref >60)
GLUCOSE: 193 mg/dL — AB (ref 65–99)
Potassium: 4.8 mmol/L (ref 3.5–5.1)
SODIUM: 138 mmol/L (ref 135–145)
Total Bilirubin: 0.4 mg/dL (ref 0.3–1.2)
Total Protein: 7 g/dL (ref 6.5–8.1)

## 2015-08-16 LAB — ABO/RH: ABO/RH(D): O POS

## 2015-08-16 LAB — CBC
HCT: 43 % (ref 39.0–52.0)
HEMOGLOBIN: 13.8 g/dL (ref 13.0–17.0)
MCH: 29.1 pg (ref 26.0–34.0)
MCHC: 32.1 g/dL (ref 30.0–36.0)
MCV: 90.7 fL (ref 78.0–100.0)
PLATELETS: 280 10*3/uL (ref 150–400)
RBC: 4.74 MIL/uL (ref 4.22–5.81)
RDW: 14.3 % (ref 11.5–15.5)
WBC: 9.7 10*3/uL (ref 4.0–10.5)

## 2015-08-16 LAB — GLUCOSE, CAPILLARY: GLUCOSE-CAPILLARY: 165 mg/dL — AB (ref 65–99)

## 2015-08-16 LAB — TYPE AND SCREEN
ABO/RH(D): O POS
ANTIBODY SCREEN: NEGATIVE

## 2015-08-16 LAB — APTT: APTT: 35 s (ref 24–37)

## 2015-08-16 LAB — SURGICAL PCR SCREEN
MRSA, PCR: NEGATIVE
Staphylococcus aureus: NEGATIVE

## 2015-08-16 LAB — PROTIME-INR
INR: 1.15 (ref 0.00–1.49)
Prothrombin Time: 14.9 seconds (ref 11.6–15.2)

## 2015-08-16 MED ORDER — ALBUTEROL SULFATE (2.5 MG/3ML) 0.083% IN NEBU
2.5000 mg | INHALATION_SOLUTION | Freq: Once | RESPIRATORY_TRACT | Status: AC
  Administered 2015-08-16: 2.5 mg via RESPIRATORY_TRACT

## 2015-08-16 NOTE — Progress Notes (Signed)
PCP is Dr Emelda Fear Cardiologist is Dr Harl Bowie Reports his CBG's run 70-107 Pt has sleep apnea- but states he can't wear the cpap

## 2015-08-16 NOTE — Patient Instructions (Signed)
  Patient should continue taking all current medications without change through the day before surgery.  Patient should have nothing to eat or drink after midnight the night before surgery.  On the morning of surgery patient should take only NPH insulin as previously instructed (1/2 normal dose if blood sugar is >70 and no insulin if blood sugar is < 70)

## 2015-08-16 NOTE — Progress Notes (Signed)
[address]       [place] [address]             [phone]     CARDIOTHORACIC SURGERY OFFICE NOTE  Referring Provider is Branch, Alphonse Guild, MD PCP is [facility], DO   HPI:  Patient returns to the office today for follow-up of severe symptomatic aortic stenosis. He was originally seen in consultation on 08/09/2015. He reports no new problems or complaints over the past week and his ear to proceed with surgery as previously planned for 08/18/2015.   Current Outpatient Prescriptions  Medication Sig Dispense Refill  . acetaminophen (TYLENOL) 650 MG CR tablet Take 1,300 mg by mouth every 8 (eight) hours as needed for pain.    Marland Kitchen ALPRAZolam (XANAX) 0.5 MG tablet Take 0.5 mg by mouth at bedtime.    Marland Kitchen aspirin EC 81 MG tablet Take 81 mg by mouth daily.    . cholecalciferol (VITAMIN D) 1000 UNITS tablet Take 1,000 Units by mouth daily.    . insulin NPH Human (HUMULIN N,NOVOLIN N) 100 UNIT/ML injection Inject 30 Units into the skin 3 (three) times daily.    . Liraglutide (VICTOZA) 18 MG/3ML SOPN Inject 1.2 mLs into the skin daily.     Marland Kitchen losartan (COZAAR) 100 MG tablet Take 100 mg by mouth daily.    . meloxicam (MOBIC) 7.5 MG tablet Take 7.5 mg by mouth daily.    . sitaGLIPtin-metformin (JANUMET) 50-1000 MG tablet Take 1 tablet by mouth 2 (two) times daily with a meal.    . VITAMIN E PO Take 1 capsule by mouth daily.     No current facility-administered medications for this visit.      Physical Exam:   BP 127/69 mmHg  Pulse 92  Resp 20  Ht 6\' 1"  (1.854 m)  Wt 122.018 kg (269 lb)  BMI 35.50 kg/m2  SpO2 95%  General:  Obese but well appearing  Chest:   Clear to auscultation  CV:   Regular rate and rhythm with prominent systolic murmur  Incisions:  n/a  Abdomen:  Soft and nontender  Extremities:  Warm and well-perfused  Diagnostic Tests:  Cardiac TAVR CT  TECHNIQUE: The patient was scanned on a Philips 256 scanner. A 120 kV retrospective scan  was triggered in the descending thoracic aorta at 111 HU's. Gantry rotation speed was 270 msecs and collimation was .9 mm. No beta blockade or nitro were given. The 3D data set was reconstructed in 5% intervals of the R-R cycle. Systolic and diastolic phases were analyzed on a dedicated work station using MPR, MIP and VRT modes. The patient received 80 cc of contrast.  FINDINGS: Aortic Valve: Trileaflet and calcified No significant annular calcification  Aorta: Nor dissection or atheroma No contraindications to percutaneous delivery  Sinotubular Junction: 31 mm  Ascending Thoracic Aorta: 36 mm  Descending Thoracic Aorta: 28 mm  Sinus of Valsalva Measurements:  Non-coronary: 34 mm  Right -coronary: 33 mm  Left -coronary: 33 mm  Coronary Artery Height above Annulus:  Left Main: 17 mm  Right Coronary: 15 mm  Virtual Basal Annulus Measurements:  Maximum/Minimum Diameter: 30.4 x 25.5 mm  Perimeter: 88 mm  Area: 628 mm2  Coronary Arteries: Sufficient height above annulus for delivery  Optimum Fluoroscopic Angle for Delivery: RAO 15 degrees Cranial 0 degrees  IMPRESSION: 1) Calcified trileaflet aortic valve Annular Area of 628 mm2 suitable for a 29 mm Sapien 3 valve  2) Coronary Artery heights suitable  for delivery  3) No aortic contraindications to percutaneous delivery  4) No LAA Thrombus  5) Optimum angle for delivery RAO 15 degrees Cranial 0 degrees  Jenkins Rouge   Electronically Signed  By: Jenkins Rouge M.D.  On: 08/12/2015 12:28   CT ANGIOGRAPHY CHEST, ABDOMEN AND PELVIS  TECHNIQUE: Multidetector CT imaging through the chest, abdomen and pelvis was performed using the standard protocol during bolus administration of intravenous contrast. Multiplanar reconstructed images and MIPs were obtained and reviewed to evaluate the vascular anatomy.  CONTRAST: 181mL OMNIPAQUE IOHEXOL 350 MG/ML  SOLN  COMPARISON: No priors.  FINDINGS: CTA CHEST FINDINGS  Mediastinum/Lymph Nodes: Heart size is borderline enlarged with concentric left ventricular hypertrophy. There is no significant pericardial fluid, thickening or pericardial calcification. There is atherosclerosis of the thoracic aorta, the great vessels of the mediastinum and the coronary arteries, including calcified atherosclerotic plaque in the left main, left anterior descending, left circumflex and right coronary arteries. Severe calcifications of the aortic valve. Mild calcifications of the mitral valve. Dilatation of the pulmonic trunk (4.1 cm in diameter). Bovine type thoracic aortic arch (normal anatomical variant) incidentally noted. No pathologically enlarged mediastinal or hilar lymph nodes. Esophagus is unremarkable in appearance. No axillary lymphadenopathy.  Lungs/Pleura: 4 mm nodule in the left upper lobe (image 17 of series 407). 4 mm nodule in the lateral segment of the right middle lobe abutting the minor fissure (image 31 of series 407). No larger more suspicious appearing pulmonary nodules or masses are otherwise noted. Linear areas of scarring in the lower lobes of the lungs bilaterally. No acute consolidative airspace disease. No pleural effusions.  Musculoskeletal/Soft Tissues: There are no aggressive appearing lytic or blastic lesions noted in the visualized portions of the skeleton.  CTA ABDOMEN AND PELVIS FINDINGS  Hepatobiliary: 1.6 cm hypervascular area in segment 4 of the liver (between segments 4A and 4B) on image 147 of series 401. No intra or extrahepatic biliary ductal dilatation. Gallbladder is normal in appearance.  Pancreas: No pancreatic mass. No pancreatic ductal dilatation. No pancreatic or peripancreatic fluid or inflammatory changes.  Spleen: Unremarkable.  Adrenals/Urinary Tract: Bilateral adrenal glands are normal in appearance. Mild bilateral renal atrophy  with multifocal mild cortical thinning. No suspicious renal lesions. No hydroureteronephrosis. Urinary bladder is normal in appearance.  Stomach/Bowel: Normal appearance of the stomach. No pathologic dilatation of small bowel or colon. Normal appendix.  Vascular/Lymphatic: Extensive atherosclerosis throughout the abdominal and pelvic vasculature, without evidence of aneurysm or dissection. Vascular findings and measurements pertinent to potential TAVR procedure, as detailed below. No lymphadenopathy noted in the abdomen or pelvis.  Reproductive: Prostate gland and seminal vesicles is unremarkable in appearance. Penile prosthesis incidentally noted.  Other: Small bilateral inguinal hernias containing only fat incidentally noted.  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 There are no aggressive appearing lytic or blastic lesions noted in the visualized portions of the skeleton.  VASCULAR MEASUREMENTS PERTINENT TO TAVR:  Comment: Sub optimal contrast bolus slightly limits assessment of the pelvic arterial vasculature.  AORTA:  Minimal Aortic Diameter - 10 x 8.5 mm  Severity of Aortic Calcification - severe  RIGHT PELVIS:  Right Common Iliac Artery -  Minimal Diameter - 8.6 x 5.2 mm  Tortuosity - mild  Calcification - severe  Right External Iliac Artery -  Minimal Diameter - 5.9 x 3.9 mm  Tortuosity - mild  Calcification - mild-to-moderate  Right Common Femoral Artery -  Minimal Diameter - 9.8 x 7.5 mm  Tortuosity - mild  Calcification - moderate-to-severe  LEFT PELVIS:  Left Common Iliac Artery -  Minimal Diameter - 8.0 x 4.8 mm  Tortuosity - mild  Calcification - severe  Left External Iliac Artery -  Minimal Diameter - 6.8 x 5.3 mm  Tortuosity -  mild  Calcification - mild-to-moderate  Left Common Femoral Artery -  Minimal Diameter - 8.9 x 5.2 mm  Tortuosity - mild  Calcification - moderate-to-severe  Review of the MIP images confirms the above findings.  IMPRESSION: 1. Today's study is slightly sub optimal secondary to a suboptimal contrast bolus. With this limitation in mind, vascular findings and measurements pertinent to potential TAVR are as discussed above. This patient does appear to have potential pelvic arterial access for low profile devices, particularly on the left side, although vascular measurements are borderline. Fortunately, there is a low degree of tortuosity of the pelvic arterial vessels. 2. Severe aortic valve calcifications, compatible with the reported clinical history of severe aortic stenosis. 3. Mild cardiomegaly with concentric left ventricular hypertrophy. 4. Dilatation of the pulmonic trunk (4.1 cm in diameter), suggestive of pulmonary arterial hypertension. 5. Tiny 4 mm nodules in the lungs bilaterally, as above. If the patient is at high risk for bronchogenic carcinoma, follow-up chest CT at 1 year is recommended. If the patient is at low risk, no follow-up is needed. This recommendation follows the consensus statement: Guidelines for Management of Small Pulmonary Nodules Detected on CT Scans: A Statement from the [employer] as published in Radiology 2005; 237:395-400. 6. 1.6 cm hypervascular area in segment 4 of the liver is indeterminate. This may simply represent a benign lesion such as a flash fill cavernous hemangioma or perfusion anomaly. However, the possibility of is not excluded a aggressive hypervascular lesion, and correlation with nonemergent MRI of the abdomen with and without IV gadolinium is suggested in the near future to definitively characterize this lesion. 7. Additional incidental findings, as above.   Electronically Signed  By: Vinnie Langton  M.D.  On: 08/12/2015 13:31   Impression:  Patient has stage D severe symptomatic aortic stenosis. I have personally reviewed the patient's recent transthoracic echocardiogram and diagnostic cardiac catheterization. Echocardiogram demonstrates the presence of a tricuspid aortic valve with severe thickening and restricted leaflet mobility involving all 3 leaflets. Peak velocity across the aortic valve measures between 3.8 and 4.0 m/s corresponding to mean transvalvular gradient estimated 42 mmHg. Left ventricular systolic function remains normal. There is severe left ventricular hypertrophy and likely significant diastolic dysfunction. Diagnostic cardiac catheterization is notable for the presence of left-dominant circulation with 75-80% stenosis of 2 relatively small terminal branches of the distal left circumflex coronary artery but otherwise nonobstructive coronary artery disease. Pulmonary artery pressures were only mildly elevated. I agree that the patient would best be treated with aortic valve replacement. Risks associated with conventional surgical aortic valve replacement should be relatively low and increased only because of the patient's obesity and somewhat limited physical mobility.   Cardiac gated CT angiogram of the heart confirms the presence of aortic stenosis without any complicating anatomical features. CT angiography of the aorta and iliac vessels demonstrates the presence of diffuse aortoiliac occlusive disease. On the circumstances, femoral artery cannulation for surgery might be associated with an increased risk of perioperative stroke or other vascular complications.    Plan:  The patient and his family were again counseled at length regarding the indications, risks, and  potential benefits of aortic valve replacement. Surgical options were discussed at length including conventional surgical aortic valve replacement using either a mechanical prosthesis or bioprosthetic tissue  valve through either conventional median sternotomy or using minimally invasive techniques. Other alternatives including transcatheter aortic valve replacement were discussed. The patient specifically requests that his valve be replaced using a bioprosthetic tissue valve via conventional median sternotomy.  The potential advantages and disadvantages associated with use of a rapid-deployment bioprosthetic aortic valve were discussed, including the risks of paravalvular leak, need for permanent pacemaker placement, and expectations for long-term durability.   They understand and accept all potential risks of surgery including but not limited to risk of death, stroke or other neurologic complication, myocardial infarction, congestive heart failure, respiratory failure, renal failure, bleeding requiring transfusion and/or reexploration, arrhythmia, infection or other wound complications, pneumonia, pleural and/or pericardial effusion, pulmonary embolus, aortic dissection or other major vascular complication, or delayed complications related to valve repair or replacement including but not limited to structural valve deterioration and failure, thrombosis, embolization, endocarditis, or paravalvular leak.  All of their questions have been answered.   In addition to other potential risks and complications from the surgery, I have made the patient aware of the recent [place] concerning the risk of infection by Myocobacterium chimaera related to the use of Stockert 3T heater-cooler equipment during cardiac surgery. I discussed with the patient the low risk of infection, as well as our compliance with the most current FDA recommendations to minimize infection and testing of all devices for contamination. The patient has been made aware of the limited alternatives to immediately replacing the current equipment. The patient has been informed regarding the risks associated with waiting to proceed with needed  surgery and that such risks are greater than the risk of infection related to the use of the heater-cooler device. I did make the patient aware that after careful review of the patients having cardiac surgery at [facility] we have no evidence that heater/cooler related infections have occurred at [facility]. We discussed that this is a slow-growing bacterium, such that it can take some period of time for symptoms to develop.   I spent in excess of 15 minutes during the conduct of this office consultation and >50% of this time involved direct face-to-face encounter with the patient for counseling and/or coordination of their care.  Valentina Gu. Roxy Manns, MD 08/16/2015 12:10 PM

## 2015-08-16 NOTE — Progress Notes (Signed)
VASCULAR LAB PRELIMINARY  PRELIMINARY  PRELIMINARY  PRELIMINARY  Pre-op Cardiac Surgery  Carotid Findings:  Bilateral:  1-39% ICA stenosis.  Vertebral artery flow is antegrade.      Upper Extremity Right Left  Brachial Pressures 126 Triphasic 93 Triphasic  Radial Waveforms Triphasic Triphasic  Ulnar Waveforms Triphasic Triphasic  Palmar Arch (Allen's Test) Normal Normal   Findings:  Doppler waveforms remained normal bilaterally with both radial and ulnar compressions.    Jayshaun Phillips, RVS 08/16/2015, 4:30 PM

## 2015-08-16 NOTE — H&P (Signed)
[address]       [place] [address]             [phone]          CARDIOTHORACIC SURGERY HISTORY AND PHYSICAL EXAM  Referring Provider is Branch, Alphonse Guild, MD PCP is Emelda Fear, DO  Chief Complaint  Patient presents with  . Aortic Stenosis    Surgical eval, Cardiac Cath 07/21/15, ECHO 07/07/15    HPI:  Patient is a 69 year old obese white male with history of obesity, type 2 diabetes mellitus, obstructive sleep apnea, and severe degenerative arthritis of the left knee who has recently been discovered to have severe aortic stenosis and has been referred for surgical consultation to discuss treatment options. The patient describes a long-standing history of symptoms of exertional shortness of breath and intermittent chest tightness that has gradually progressed over the past 2 years. He has been struggling with chronic pain in his left knee related to degenerative arthritis that has severely limited his physical mobility over the past 3 years or more. He was recently evaluated by an orthopedic surgeon and plans were in place to proceed with knee replacement. However, the patient was noted to have a systolic murmur on physical exam and referred for cardiology clearance. The patient was evaluated by Dr. Harl Bowie and underwent a transthoracic echocardiogram on 07/07/2015. Peak velocity across the aortic valve measured 3.8 m/s corresponding to mean transvalvular gradient estimated 42 mmHg and aortic valve area calculated 0.96 cm. Left ventricular systolic function remains preserved with ejection fraction estimated 60-65%. There was severe left ventricular hypertrophy with significant diastolic dysfunction. The patient subsequently underwent left and right heart catheterization by Dr. Burt Knack on 07/21/2015. The patient was found to have moderate nonobstructive coronary artery disease. The patient was referred for surgical consultation.  The patient is married  and lives with his wife in [place]. 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 The patient states that he remain physically active all of his life until he began to experience worsening problems with chronic pain in his left knee area for the last 3 years he has been fairly sedentary. He walks using a cane when he has to walk longer distances, but around the house he does not require any physical assistance. He is limited both by chronic pain in his left knee as well as symptoms of exertional shortness of breath. He states that he gets short of breath with moderate physical activity. He denies any history of resting shortness of breath, PND, orthopnea, or syncope. He has occasional dizzy spells and mild to moderate chronic bilateral lower extremity edema. He also describes intermittent symptoms of tightness across his left chest. The symptoms are not necessarily related to physical activity didn't seem to calm and go sporadically.          Past Medical History  Diagnosis Date  . Diabetes mellitus ([facility])   . Severe aortic stenosis 07/21/2015  . Type II diabetes mellitus ([facility])     Insulin-dependent  . Chronic diastolic congestive heart failure ([facility])     NYHA functional class II  . Obesity   . Obstructive sleep apnea     Intolerant of CPAP  . Varicose veins   . Cancer ([facility])   . Heart murmur   . Asthma     as a child  . Arthritis     Past Surgical History  Procedure Laterality Date  .  Polypectomy  03/21/2010  . Cervical spine surgery      30 years ago and last time 10 years ago  . Skin cancer excision  1991    removal of skin cancer to right cheek area  . Knee surgery Left 2013  . Shoulder surgery Left 2013  . Cardiac catheterization N/A 07/21/2015    Procedure: Right/Left Heart Cath and Coronary Angiography;  Surgeon: Sherren Mocha, MD;  Location: [facility];  Service: Cardiovascular;  Laterality:  N/A;  . Colonoscopy      Family History  Problem Relation Age of Onset  . Heart attack Mother   . Alcoholism Father   . Diabetes Brother   . Valvular heart disease Sister     Social History Social History  Substance Use Topics  . Smoking status: Former Smoker -- 1.50 packs/day for 33 years    Types: Cigarettes    Start date: 06/21/1957    Quit date: 09/04/1989  . Smokeless tobacco: Former Systems developer    Quit date: 09/04/1969  . Alcohol Use: No    Prior to Admission medications   Medication Sig Start Date End Date Taking? Authorizing Provider  acetaminophen (TYLENOL) 650 MG CR tablet Take 1,300 mg by mouth every 8 (eight) hours as needed for pain.   Yes Historical Provider, MD  ALPRAZolam Duanne Moron) 0.5 MG tablet Take 0.5 mg by mouth at bedtime.   Yes Historical Provider, MD  aspirin EC 81 MG tablet Take 81 mg by mouth daily.   Yes Historical Provider, MD  cholecalciferol (VITAMIN D) 1000 UNITS tablet Take 1,000 Units by mouth daily.   Yes Historical Provider, MD  insulin NPH Human (HUMULIN N,NOVOLIN N) 100 UNIT/ML injection Inject 30 Units into the skin 3 (three) times daily.   Yes Historical Provider, MD  Liraglutide (VICTOZA) 18 MG/3ML SOPN Inject 1.2 mLs into the skin daily.    Yes Historical Provider, MD  losartan (COZAAR) 100 MG tablet Take 100 mg by mouth daily.   Yes Historical Provider, MD  meloxicam (MOBIC) 7.5 MG tablet Take 7.5 mg by mouth daily.   Yes Historical Provider, MD  sitaGLIPtin-metformin (JANUMET) 50-1000 MG tablet Take 1 tablet by mouth 2 (two) times daily with a meal.   Yes Historical Provider, MD  VITAMIN E PO Take 1 capsule by mouth daily.   Yes Historical Provider, MD    No Known Allergies    Review of Systems:  General:normal appetite, decreased energy, + weight gain, no weight loss, no fever Cardiac:+ chest pain with exertion, + chest pain at rest, + SOB with exertion, no resting SOB,  no PND, no orthopnea, no palpitations, no arrhythmia, no atrial fibrillation, + LE edema, + dizzy spells, no syncope Respiratory:+ exertional shortness of breath, no home oxygen, + chronic productive cough, + dry cough, no bronchitis, no wheezing, no hemoptysis, no asthma, no pain with inspiration or cough, + sleep apnea, does not use CPAP at night GI:no difficulty swallowing, occasional reflux, no frequent heartburn, no hiatal hernia, no abdominal pain, occasional constipation, no diarrhea, no hematochezia, no hematemesis, no melena GU:no dysuria, no frequency, no urinary tract infection, no hematuria, no enlarged prostate, no kidney stones, no kidney disease Vascular:no pain suggestive of claudication, no pain in feet, no leg cramps, + varicose veins, no DVT, no non-healing foot ulcer Neuro:no stroke, no TIA's, no seizures, no headaches, no temporary blindness one eye, no slurred speech, + peripheral neuropathy, + chronic pain, + instability of gait, no memory/cognitive dysfunction Musculoskeletal:+ arthritis, +  joint swelling, no myalgias, + difficulty walking, decreased mobility  Skin:no rash, no itching, no skin infections, no pressure sores or ulcerations Psych:no anxiety, no depression, no nervousness, no unusual recent stress Eyes:no blurry vision, no floaters, no recent vision changes, + wears glasses or contacts ENT:+ hearing loss, no loose or painful teeth, no dentures, last saw dentist several years ago Hematologic:no easy bruising, no abnormal bleeding, no clotting disorder, no frequent  epistaxis Endocrine:+ diabetes, does check CBG's at home, most recent hemoglobin a1c reportedly 8.0   Physical Exam:  BP 140/74 mmHg  Pulse 104  Resp 20  Ht 6\' 1"  (1.854 m)  Wt 270 lb (122.471 kg)  BMI 35.63 kg/m2  SpO2 95% General:Obese but o/w well-appearing HEENT:Unremarkable  Neck:no JVD, no bruits, no adenopathy  Chest:clear to auscultation, symmetrical breath sounds, no wheezes, no rhonchi  CV:RRR, grade III/VI systolic murmur best along sternal border Abdomen:soft, non-tender, no masses, + rectus diastasis Extremities:warm, well-perfused, pulses diminished, + mild LE edema Rectal/GUDeferred Neuro:Grossly non-focal and symmetrical throughout Skin:Clean and dry, no rashes, no breakdown   Diagnostic Tests:  Transthoracic Echocardiography  Patient:  Yancarlos, Juhnke MR #:    ID:[id number] Study Date: 07/07/2015 Gender:   M Age:    65 Height:   185.4 cm Weight:   124.3 kg BSA:    2.57 m^2 Pt. Status: Room:  ATTENDING  Kerry Hough, M.D. Berna Spare, M.D. REFERRING  Kerry Hough, M.D. PERFORMING  [facility], [facility] SONOGRAPHER Alvino Chapel, RCS  cc:  ------------------------------------------------------------------- LV EF: 60% -  65%  ------------------------------------------------------------------- Indications:   Murmur 785.2.  ------------------------------------------------------------------- Study Conclusions  - Left ventricle: The cavity size was normal. Wall thickness  was increased in a pattern of severe LVH. Systolic function was normal. The estimated ejection fraction was in the range of 60% to 65%. Wall motion was normal; there were no regional wall motion abnormalities. Doppler parameters are consistent with abnormal left ventricular relaxation (grade 1 diastolic dysfunction). - Aortic valve: Severely calcified annulus. Trileaflet; severely thickened leaflets. There was severe stenosis. The contour of the spectral Doppler waveform may lead to underestimation of severity. Valve area (VTI): 1.01 cm^2. Valve area (Vmax): 0.96 cm^2. Valve area (Vmean): 0.79 cm^2. - Mitral valve: Mildly calcified annulus. Mildly thickened leaflets . - Left atrium: The atrium was mildly dilated. - Technically adequate study.  Transthoracic echocardiography. M-mode, complete 2D, spectral Doppler, and color Doppler. Birthdate: Patient birthdate: 04-05-1946. Age: Patient is 69 yr old. Sex: Gender: male. BMI: 36.2 kg/m^2. Blood pressure:   103/87 Patient status: Inpatient. Study date: Study date: 07/07/2015. Study time: 10:05 AM. Location: Echo laboratory.  -------------------------------------------------------------------  ------------------------------------------------------------------- Left ventricle: The cavity size was normal. Wall thickness was increased in a pattern of severe LVH. Systolic function was normal. The estimated ejection fraction was in the range of 60% to 65%. Wall motion was normal; there were no regional wall motion abnormalities. Doppler parameters are consistent with abnormal left ventricular relaxation (grade 1 diastolic dysfunction).  ------------------------------------------------------------------- Aortic valve:  Severely calcified annulus. Trileaflet; severely thickened leaflets. Doppler:  There was severe stenosis.  The contour of the spectral Doppler waveform may lead to underestimation of  severity. There was no significant regurgitation.  VTI ratio of LVOT to aortic valve: 0.29. Valve area (VTI): 1.01 cm^2. Indexed valve area (VTI): 0.39 cm^2/m^2. Peak velocity ratio of LVOT to aortic valve: 0.28. Valve area (Vmax): 0.96 cm^2. Indexed valve area (Vmax): 0.37 cm^2/m^2. Mean velocity ratio of LVOT to aortic valve: 0.23. Valve area (Vmean):  0.79 cm^2. Indexed valve area (Vmean): 0.31 cm^2/m^2.  Mean gradient (S): 42 mm Hg. Peak gradient (S): 59 mm Hg.  ------------------------------------------------------------------- Aorta: Aortic root: The aortic root was normal in size.  ------------------------------------------------------------------- Mitral valve:  Mildly calcified annulus. Mildly thickened leaflets . Doppler:  There was no evidence for stenosis.  There was no significant regurgitation.  ------------------------------------------------------------------- Left atrium: The atrium was mildly dilated.  ------------------------------------------------------------------- Right ventricle: The cavity size was normal. Systolic function was normal.  ------------------------------------------------------------------- Pulmonic valve:  Not well visualized. Doppler:  There was no evidence for stenosis.  There was no significant regurgitation.  ------------------------------------------------------------------- Tricuspid valve:  Normal thickness leaflets. Doppler:  There was no evidence for stenosis.  There was no significant regurgitation.  ------------------------------------------------------------------- Pulmonary artery:  Systolic pressure could not be accurately estimated.  Inadequate TR jet.  ------------------------------------------------------------------- Right atrium: The atrium was normal in size.  ------------------------------------------------------------------- Pericardium: There was no pericardial  effusion.  ------------------------------------------------------------------- Systemic veins: Inferior vena cava: The vessel was normal in size. The respirophasic diameter changes were in the normal range (>= 50%), consistent with normal central venous pressure.  ------------------------------------------------------------------- Measurements  Left ventricle              Value      Reference LV ID, ED, PLAX chordal         46.9  mm    43 - 52 LV ID, ES, PLAX chordal         31.7  mm    23 - 38 LV fx shortening, PLAX chordal      32   %    >=29 LV PW thickness, ED           15   mm    --------- IVS/LV PW ratio, ED           1        <=1.3  Ventricular septum            Value      Reference IVS thickness, ED            15   mm    ---------  LVOT                   Value      Reference LVOT ID, S                21   mm    --------- LVOT area                3.46  cm^2   --------- LVOT peak velocity, S          106  cm/s   --------- LVOT mean velocity, S          71.8  cm/s   --------- LVOT VTI, S               23   cm    --------- LVOT peak gradient, S          4   mm Hg  ---------  Aortic valve               Value      Reference Aortic valve peak velocity, S      384.44 cm/s   --------- Aortic valve mean velocity, S      315.27 cm/s   --------- Aortic valve VTI, S           78.82 cm    --------- Aortic  mean gradient, S         42   mm Hg  --------- Aortic peak gradient, S         59   mm Hg  --------- VTI ratio, LVOT/AV            0.29      --------- Aortic valve area, VTI          1.01  cm^2   --------- Aortic  valve area/bsa, VTI        0.39  cm^2/m^2 --------- Velocity ratio, peak, LVOT/AV      0.28      --------- Aortic valve area, peak velocity     0.96  cm^2   --------- Aortic valve area/bsa, peak       0.37  cm^2/m^2 --------- velocity Velocity ratio, mean, LVOT/AV      0.23      --------- Aortic valve area, mean velocity     0.79  cm^2   --------- Aortic valve area/bsa, mean       0.31  cm^2/m^2 --------- velocity  Aorta                  Value      Reference Aortic root ID, ED            29   mm    ---------  Left atrium               Value      Reference LA ID, A-P, ES              54   mm    --------- LA volume/bsa, S             28.7  ml/m^2  ---------  Mitral valve               Value      Reference Mitral E-wave peak velocity       66.4  cm/s   --------- Mitral A-wave peak velocity       97.3  cm/s   --------- Mitral deceleration time     (H)   362  ms    150 - 230 Mitral E/A ratio, peak          0.7       ---------  Right ventricle             Value      Reference TAPSE                  17   mm    ---------  Legend: (L) and (H) mark values outside specified reference range.  ------------------------------------------------------------------- Prepared and Electronically Authenticated by  Kerry Hough, M.D. 2016-11-02T14:55:54    CARDIAC CATHETERIZATION  Procedures    Right/Left Heart Cath and Coronary Angiography    Conclusion    1. Known severe aortic stenosis by echo criteria 2. Distal vessel CAD, likely most appropriate for medical therapy  TCTS consult for aortic valve replacement    Indications    Aortic valve stenosis, severe [I35.0 (ICD-10-CM)]     Technique and Indications    INDICATION: Severe aortic stenosis  PROCEDURAL DETAILS: There was an indwelling IV in a right antecubital vein. Using normal sterile technique, the IV was changed out for a 5 Fr brachial sheath over a 0.018 inch wire. The right wrist was then prepped, draped, and anesthetized with 1% lidocaine. Using the modified Seldinger technique a 5/6 French Slender sheath  was placed in the right radial artery. Intra-arterial verapamil was administered through the radial artery sheath. IV heparin was administered after a JR4 catheter was advanced into the central aorta. A Swan-Ganz catheter was used for the right heart catheterization. Standard protocol was followed for recording of right heart pressures and sampling of oxygen saturations. Fick cardiac output was calculated. Standard Judkins catheters were used for selective coronary angiography. An AR-1 catheter is used for injection of the RCA. I attempted to cross the aortic valve with an AR-1, pigtail, and J-wire without success. The angulation of the innominate into the aorta was unfavorable for crossing the aortic valve. There were no immediate procedural complications. The patient was transferred to the post catheterization recovery area for further monitoring.   Estimated blood loss <50 mL. There were no immediate complications during the procedure.    Coronary Findings    Dominance: Left   Left Main   . LM lesion, 30% stenosed.     Left Circumflex   . Mid Cx lesion, 25% stenosed.   . Second Obtuse Marginal Branch   The vessel is small in size. There is moderate disease in the vessel.   . Third Obtuse Marginal Branch   The vessel is small in size. There is moderate disease in the vessel.   . First Left Posterolateral Branch   . 1st LPL lesion, 75% stenosed.   . Third Left Posterolateral Branch   . 3rd LPL lesion, 80% stenosed. Small vessel     Right Coronary Artery   Patent, nondominant RCA      Coronary Diagrams    Diagnostic Diagram            Implants    Name ID Temporary Type Supply   No information to display    PACS Images    Show images for Cardiac catheterization     Link to Procedure Log    Procedure Log      Hemo Data       Most Recent Value   Fick Cardiac Output  5.51 L/min   Fick Cardiac Output Index  2.29 (L/min)/BSA   RA A Wave  6 mmHg   RA V Wave  6 mmHg   RA Mean  4 mmHg   RV Systolic Pressure  34 mmHg   RV Diastolic Pressure  1 mmHg   RV EDP  8 mmHg   PA Systolic Pressure  32 mmHg   PA Diastolic Pressure  10 mmHg   PA Mean  21 mmHg   PW A Wave  11 mmHg   PW V Wave  9 mmHg   PW Mean  8 mmHg   AO Systolic Pressure  [id number] mmHg   AO Diastolic Pressure  68 mmHg   AO Mean  97 mmHg   QP/QS  1   TPVR Index  9.19 HRUI   TSVR Index  42.44 HRUI   PVR SVR Ratio  0.14   TPVR/TSVR Ratio  0.22    Cardiac TAVR CT  TECHNIQUE: The patient was scanned on a Philips 256 scanner. A 120 kV retrospective scan was triggered in the descending thoracic aorta at 111 HU's. Gantry rotation speed was 270 msecs and collimation was .9 mm. No beta blockade or nitro were given. The 3D data set was reconstructed in 5% intervals of the R-R cycle. Systolic and diastolic phases were analyzed on a dedicated work station using MPR, MIP and VRT modes. The patient received 80 cc of contrast.  FINDINGS: Aortic  Valve: Trileaflet and calcified No significant annular calcification  Aorta: Nor dissection or atheroma No contraindications to percutaneous delivery  Sinotubular Junction: 31 mm  Ascending Thoracic Aorta: 36 mm  Descending Thoracic Aorta: 28 mm  Sinus of Valsalva Measurements:  Non-coronary: 34 mm  Right -coronary: 33 mm  Left -coronary: 33  mm  Coronary Artery Height above Annulus:  Left Main: 17 mm  Right Coronary: 15 mm  Virtual Basal Annulus Measurements:  Maximum/Minimum Diameter: 30.4 x 25.5 mm  Perimeter: 88 mm  Area: 628 mm2  Coronary Arteries: Sufficient height above annulus for delivery  Optimum Fluoroscopic Angle for Delivery: RAO 15 degrees Cranial 0 degrees  IMPRESSION: 1) Calcified trileaflet aortic valve Annular Area of 628 mm2 suitable for a 29 mm Sapien 3 valve  2) Coronary Artery heights suitable for delivery  3) No aortic contraindications to percutaneous delivery  4) No LAA Thrombus  5) Optimum angle for delivery RAO 15 degrees Cranial 0 degrees  Jenkins Rouge   Electronically Signed  By: Jenkins Rouge M.D.  On: 08/12/2015 12:28   CT ANGIOGRAPHY CHEST, ABDOMEN AND PELVIS  TECHNIQUE: Multidetector CT imaging through the chest, abdomen and pelvis was performed using the standard protocol during bolus administration of intravenous contrast. Multiplanar reconstructed images and MIPs were obtained and reviewed to evaluate the vascular anatomy.  CONTRAST: 142mL OMNIPAQUE IOHEXOL 350 MG/ML SOLN  COMPARISON: No priors.  FINDINGS: CTA CHEST FINDINGS  Mediastinum/Lymph Nodes: Heart size is borderline enlarged with concentric left ventricular hypertrophy. There is no significant pericardial fluid, thickening or pericardial calcification. There is atherosclerosis of the thoracic aorta, the great vessels of the mediastinum and the coronary arteries, including calcified atherosclerotic plaque in the left main, left anterior descending, left circumflex and right coronary arteries. Severe calcifications of the aortic valve. Mild calcifications of the mitral valve. Dilatation of the pulmonic trunk (4.1 cm in diameter). Bovine type thoracic aortic arch (normal anatomical variant) incidentally noted. No pathologically enlarged mediastinal or hilar lymph  nodes. Esophagus is unremarkable in appearance. No axillary lymphadenopathy.  Lungs/Pleura: 4 mm nodule in the left upper lobe (image 17 of series 407). 4 mm nodule in the lateral segment of the right middle lobe abutting the minor fissure (image 31 of series 407). No larger more suspicious appearing pulmonary nodules or masses are otherwise noted. Linear areas of scarring in the lower lobes of the lungs bilaterally. No acute consolidative airspace disease. No pleural effusions.  Musculoskeletal/Soft Tissues: There are no aggressive appearing lytic or blastic lesions noted in the visualized portions of the skeleton.  CTA ABDOMEN AND PELVIS FINDINGS  Hepatobiliary: 1.6 cm hypervascular area in segment 4 of the liver (between segments 4A and 4B) on image 147 of series 401. No intra or extrahepatic biliary ductal dilatation. Gallbladder is normal in appearance.  Pancreas: No pancreatic mass. No pancreatic ductal dilatation. No pancreatic or peripancreatic fluid or inflammatory changes.  Spleen: Unremarkable.  Adrenals/Urinary Tract: Bilateral adrenal glands are normal in appearance. Mild bilateral renal atrophy with multifocal mild cortical thinning. No suspicious renal lesions. No hydroureteronephrosis. Urinary bladder is normal in appearance.  Stomach/Bowel: Normal appearance of the stomach. No pathologic dilatation of small bowel or colon. Normal appendix.  Vascular/Lymphatic: Extensive atherosclerosis throughout the abdominal and pelvic vasculature, without evidence of aneurysm or dissection. Vascular findings and measurements pertinent to potential TAVR procedure, as detailed below. No lymphadenopathy noted in the abdomen or pelvis.  Reproductive: Prostate gland and seminal vesicles is unremarkable in appearance. Penile prosthesis incidentally noted.  Other: Small  bilateral inguinal hernias containing only fat incidentally noted.  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 There are no aggressive appearing lytic or blastic lesions noted in the visualized portions of the skeleton.  VASCULAR MEASUREMENTS PERTINENT TO TAVR:  Comment: Sub optimal contrast bolus slightly limits assessment of the pelvic arterial vasculature.  AORTA:  Minimal Aortic Diameter - 10 x 8.5 mm  Severity of Aortic Calcification - severe  RIGHT PELVIS:  Right Common Iliac Artery -  Minimal Diameter - 8.6 x 5.2 mm  Tortuosity - mild  Calcification - severe  Right External Iliac Artery -  Minimal Diameter - 5.9 x 3.9 mm  Tortuosity - mild  Calcification - mild-to-moderate  Right Common Femoral Artery -  Minimal Diameter - 9.8 x 7.5 mm  Tortuosity - mild  Calcification - moderate-to-severe  LEFT PELVIS:  Left Common Iliac Artery -  Minimal Diameter - 8.0 x 4.8 mm  Tortuosity - mild  Calcification - severe  Left External Iliac Artery -  Minimal Diameter - 6.8 x 5.3 mm  Tortuosity - mild  Calcification - mild-to-moderate  Left Common Femoral Artery -  Minimal Diameter - 8.9 x 5.2 mm  Tortuosity - mild  Calcification - moderate-to-severe  Review of the MIP images confirms the above findings.  IMPRESSION: 1. Today's study is slightly sub optimal secondary to a suboptimal contrast bolus. With this limitation in mind, vascular findings and measurements pertinent to potential TAVR are as discussed above. This patient does appear to have potential pelvic arterial access for low profile devices, particularly on the left side, although vascular measurements are borderline. Fortunately, there is a low degree of tortuosity of the pelvic arterial vessels. 2. Severe aortic valve calcifications, compatible with the reported clinical history  of severe aortic stenosis. 3. Mild cardiomegaly with concentric left ventricular hypertrophy. 4. Dilatation of the pulmonic trunk (4.1 cm in diameter), suggestive of pulmonary arterial hypertension. 5. Tiny 4 mm nodules in the lungs bilaterally, as above. If the patient is at high risk for bronchogenic carcinoma, follow-up chest CT at 1 year is recommended. If the patient is at low risk, no follow-up is needed. This recommendation follows the consensus statement: Guidelines for Management of Small Pulmonary Nodules Detected on CT Scans: A Statement from the [employer] as published in Radiology 2005; 237:395-400. 6. 1.6 cm hypervascular area in segment 4 of the liver is indeterminate. This may simply represent a benign lesion such as a flash fill cavernous hemangioma or perfusion anomaly. However, the possibility of is not excluded a aggressive hypervascular lesion, and correlation with nonemergent MRI of the abdomen with and without IV gadolinium is suggested in the near future to definitively characterize this lesion. 7. Additional incidental findings, as above.   Electronically Signed  By: Vinnie Langton M.D.  On: 08/12/2015 13:31    STS Risk Calculator  ProcedureAVR  Risk of Mortality2.1% Morbidity or Mortality17.3% Prolonged LOS7.6% Short LOS35.4% Permanent Stroke0.8% Prolonged Vent Support10.5% DSW Infection0.8% Renal Failure5.4% Reoperation7.1%          Impression:  Patient has stage D severe symptomatic aortic stenosis. I have personally reviewed the patient's recent transthoracic echocardiogram and diagnostic  cardiac catheterization. Echocardiogram demonstrates the presence of a tricuspid aortic valve with severe thickening and restricted leaflet mobility involving all 3 leaflets. Peak velocity across the aortic valve measures between 3.8 and 4.0 m/s corresponding to mean transvalvular gradient  estimated 42 mmHg. Left ventricular systolic function remains normal. There is severe left ventricular hypertrophy and likely significant diastolic dysfunction. Diagnostic cardiac catheterization is notable for the presence of left-dominant circulation with 75-80% stenosis of 2 relatively small terminal branches of the distal left circumflex coronary artery but otherwise nonobstructive coronary artery disease. Pulmonary artery pressures were only mildly elevated. I agree that the patient would best be treated with aortic valve replacement. Risks associated with conventional surgical aortic valve replacement should be relatively low and increased only because of the patient's obesity and somewhat limited physical mobility. Cardiac gated CT angiogram of the heart confirms the presence of aortic stenosis without any complicating anatomical features. CT angiography of the aorta and iliac vessels demonstrates the presence of diffuse aortoiliac occlusive disease. On the circumstances, femoral artery cannulation for surgery might be associated with an increased risk of perioperative stroke or other vascular complications.    Plan:  The patient and his family were again counseled at length regarding the indications, risks, and potential benefits of aortic valve replacement. Surgical options were discussed at length including conventional surgical aortic valve replacement using either a mechanical prosthesis or bioprosthetic tissue valve through either conventional median sternotomy or using minimally invasive techniques. Other alternatives including transcatheter aortic valve replacement were discussed. The patient  specifically requests that his valve be replaced using a bioprosthetic tissue valve via conventional median sternotomy. The potential advantages and disadvantages associated with use of a rapid-deployment bioprosthetic aortic valve were discussed, including the risks of paravalvular leak, need for permanent pacemaker placement, and expectations for long-term durability. They understand and accept all potential risks of surgery including but not limited to risk of death, stroke or other neurologic complication, myocardial infarction, congestive heart failure, respiratory failure, renal failure, bleeding requiring transfusion and/or reexploration, arrhythmia, infection or other wound complications, pneumonia, pleural and/or pericardial effusion, pulmonary embolus, aortic dissection or other major vascular complication, or delayed complications related to valve repair or replacement including but not limited to structural valve deterioration and failure, thrombosis, embolization, endocarditis, or paravalvular leak. All of their questions have been answered.  In addition to other potential risks and complications from the surgery, I have made the patient aware of the recent [place] concerning the risk of infection by Myocobacterium chimaera related to the use of [employer] 3T heater-cooler equipment during cardiac surgery. I discussed with the patient the low risk of infection, as well as our compliance with the most current FDA recommendations to minimize infection and testing of all devices for contamination. The patient has been made aware of the limited alternatives to immediately replacing the current equipment. The patient has been informed regarding the risks associated with waiting to proceed with needed surgery and that such risks are greater than the risk of infection related to the use of the heater-cooler device. I did make the patient aware that after careful review of the patients having  cardiac surgery at [facility] we have no evidence that heater/cooler related infections have occurred at [facility]. We discussed that this is a slow-growing bacterium, such that it can take some period of time for symptoms to develop.   I spent in excess of 15 minutes during the conduct of this office consultation and >50% of this time involved direct face-to-face encounter with the patient for counseling and/or coordination of their care.  Valentina Gu. Roxy Manns, MD 08/16/2015 12:10 PM

## 2015-08-17 LAB — HEMOGLOBIN A1C
HEMOGLOBIN A1C: 7.9 % — AB (ref 4.8–5.6)
MEAN PLASMA GLUCOSE: 180 mg/dL

## 2015-08-17 MED ORDER — HEPARIN SODIUM (PORCINE) 1000 UNIT/ML IJ SOLN
INTRAMUSCULAR | Status: DC
  Filled 2015-08-17: qty 30

## 2015-08-17 MED ORDER — MAGNESIUM SULFATE 50 % IJ SOLN
40.0000 meq | INTRAMUSCULAR | Status: DC
  Filled 2015-08-17: qty 10

## 2015-08-17 MED ORDER — EPINEPHRINE HCL 1 MG/ML IJ SOLN
0.0000 ug/min | INTRAVENOUS | Status: DC
  Filled 2015-08-17: qty 4

## 2015-08-17 MED ORDER — NITROGLYCERIN IN D5W 200-5 MCG/ML-% IV SOLN
2.0000 ug/min | INTRAVENOUS | Status: AC
  Administered 2015-08-18: 5 ug/kg/min via INTRAVENOUS
  Filled 2015-08-17: qty 250

## 2015-08-17 MED ORDER — DOPAMINE-DEXTROSE 3.2-5 MG/ML-% IV SOLN
0.0000 ug/kg/min | INTRAVENOUS | Status: DC
  Filled 2015-08-17: qty 250

## 2015-08-17 MED ORDER — METOPROLOL TARTRATE 12.5 MG HALF TABLET
12.5000 mg | ORAL_TABLET | Freq: Once | ORAL | Status: AC
  Administered 2015-08-18: 12.5 mg via ORAL
  Filled 2015-08-17: qty 1

## 2015-08-17 MED ORDER — DEXTROSE 5 % IV SOLN
750.0000 mg | INTRAVENOUS | Status: DC
  Filled 2015-08-17: qty 750

## 2015-08-17 MED ORDER — CHLORHEXIDINE GLUCONATE 0.12 % MT SOLN
15.0000 mL | Freq: Once | OROMUCOSAL | Status: AC
  Administered 2015-08-18: 15 mL via OROMUCOSAL
  Filled 2015-08-17: qty 15

## 2015-08-17 MED ORDER — PHENYLEPHRINE HCL 10 MG/ML IJ SOLN
30.0000 ug/min | INTRAVENOUS | Status: AC
  Administered 2015-08-18: 20 ug/min via INTRAVENOUS
  Filled 2015-08-17: qty 2

## 2015-08-17 MED ORDER — VANCOMYCIN HCL 10 G IV SOLR
1500.0000 mg | INTRAVENOUS | Status: AC
  Administered 2015-08-18: 1000 mg via INTRAVENOUS
  Filled 2015-08-17: qty 1500

## 2015-08-17 MED ORDER — DEXTROSE 5 % IV SOLN
1.5000 g | INTRAVENOUS | Status: AC
  Administered 2015-08-18: .75 g via INTRAVENOUS
  Administered 2015-08-18: 1.5 g via INTRAVENOUS
  Filled 2015-08-17 (×2): qty 1.5

## 2015-08-17 MED ORDER — CHLORHEXIDINE GLUCONATE 4 % EX LIQD: 30.0000 mL | CUTANEOUS | Status: DC

## 2015-08-17 MED ORDER — PAPAVERINE HCL 30 MG/ML IJ SOLN
INTRAMUSCULAR | Status: DC
  Filled 2015-08-17: qty 2.5

## 2015-08-17 MED ORDER — DEXMEDETOMIDINE HCL IN NACL 400 MCG/100ML IV SOLN
0.1000 ug/kg/h | INTRAVENOUS | Status: AC
  Administered 2015-08-18: 14:00:00 via INTRAVENOUS
  Administered 2015-08-18: .3 ug/kg/h via INTRAVENOUS
  Filled 2015-08-17: qty 100

## 2015-08-17 MED ORDER — SODIUM CHLORIDE 0.9 % IV SOLN
INTRAVENOUS | Status: AC
  Administered 2015-08-18: 69.8 mL/h via INTRAVENOUS
  Filled 2015-08-17: qty 40

## 2015-08-17 MED ORDER — SODIUM CHLORIDE 0.9 % IV SOLN
INTRAVENOUS | Status: AC
  Administered 2015-08-18: 1 [IU]/h via INTRAVENOUS
  Filled 2015-08-17: qty 2.5

## 2015-08-17 MED ORDER — VANCOMYCIN HCL 1000 MG IV SOLR
INTRAVENOUS | Status: AC
  Administered 2015-08-18: 1000 mL
  Filled 2015-08-17: qty 1000

## 2015-08-17 MED ORDER — POTASSIUM CHLORIDE 2 MEQ/ML IV SOLN
80.0000 meq | INTRAVENOUS | Status: DC
  Filled 2015-08-17: qty 40

## 2015-08-17 NOTE — Anesthesia Preprocedure Evaluation (Addendum)
Anesthesia Evaluation  Patient identified by MRN, date of birth, ID band Patient awake    Reviewed: Allergy & Precautions, NPO status , Patient's Chart, lab work & pertinent test results, reviewed documented beta blocker date and time   History of Anesthesia Complications Negative for: history of anesthetic complications  Airway Mallampati: II  TM Distance: >3 FB Neck ROM: Full    Dental  (+) Edentulous Upper, Edentulous Lower   Pulmonary asthma , sleep apnea , former smoker,  Intolerant of CPAP Asthma as a child   breath sounds clear to auscultation + decreased breath sounds      Cardiovascular Exercise Tolerance: Poor + CAD, +CHF and + DOE  II+ Valvular Problems/Murmurs AS  Rhythm:Regular Rate:Normal     Neuro/Psych History of cervical spine surgery +30 years ago and then again 10 years ago negative psych ROS   GI/Hepatic   Endo/Other  diabetes, Type 2, Insulin Dependent  Renal/GU      Musculoskeletal  (+) Arthritis , Osteoarthritis,    Abdominal (+) + obese,  Abdomen: soft. Bowel sounds: normal.  Peds  Hematology   Anesthesia Other Findings   Reproductive/Obstetrics                         Per Dr. Guy Sandifer note (08/17/15): "The patient was evaluated by Dr. Harl Bowie and underwent a transthoracic echocardiogram on 07/07/2015. Peak velocity across the aortic valve measured 3.8 m/s corresponding to mean transvalvular gradient estimated 42 mmHg and aortic valve area calculated 0.96 cm. Left ventricular systolic function remains preserved with ejection fraction estimated 60-65%. There was severe left ventricular hypertrophy with significant diastolic dysfunction. The patient subsequently underwent left and right heart catheterization by Dr. Burt Knack on 07/21/2015. The patient was found to have moderate nonobstructive coronary artery disease."  BP Readings from Last 3 Encounters:  08/16/15 127/69   08/16/15 122/63  08/12/15 132/73   Wt Readings from Last 3 Encounters:  08/16/15 269 lb (122.018 kg)  08/16/15 269 lb 6.4 oz (122.199 kg)  08/09/15 270 lb (122.471 kg)   Lab Results  Component Value Date   WBC 9.7 08/16/2015   HGB 13.8 08/16/2015   HCT 43.0 08/16/2015   MCV 90.7 08/16/2015   PLT 280 08/16/2015     Chemistry      Component Value Date/Time   NA 138 08/16/2015 0944   K 4.8 08/16/2015 0944   CL 107 08/16/2015 0944   CO2 20* 08/16/2015 0944   BUN 23* 08/16/2015 0944   CREATININE 1.11 08/16/2015 0944      Component Value Date/Time   CALCIUM 9.2 08/16/2015 0944   ALKPHOS 139* 08/16/2015 0944   AST 19 08/16/2015 0944   ALT 14* 08/16/2015 0944   BILITOT 0.4 08/16/2015 0944     Echo 07/07/15: Study Conclusions  - Left ventricle: The cavity size was normal. Wall thickness was increased in a pattern of severe LVH. Systolic function was normal. The estimated ejection fraction was in the range of 60% to 65%. Wall motion was normal; there were no regional wall motion abnormalities. Doppler parameters are consistent with abnormal left ventricular relaxation (grade 1 diastolic dysfunction). - Aortic valve: Severely calcified annulus. Trileaflet; severely thickened leaflets. There was severe stenosis. The contour of the spectral Doppler waveform may lead to underestimation of severity. Valve area (VTI): 1.01 cm^2. Valve area (Vmax): 0.96 cm^2. Valve area (Vmean): 0.79 cm^2. - Mitral valve: Mildly calcified annulus. Mildly thickened leaflets . - Left atrium: The atrium was  mildly dilated. - Technically adequate study.  EKG 08/16/15: Sinus rhythm with 1st degree A-V block T wave abnormality, consider lateral ischemia  Anesthesia Physical Anesthesia Plan  ASA: IV  Anesthesia Plan: General   Post-op Pain Management:    Induction: Intravenous  Airway Management Planned: Oral ETT  Additional Equipment: CVP, Arterial line, 3D TEE,  TEE, PA Cath and Ultrasound Guidance Line Placement  Intra-op Plan:   Post-operative Plan: Extubation in OR  Informed Consent: I have reviewed the patients History and Physical, chart, labs and discussed the procedure including the risks, benefits and alternatives for the proposed anesthesia with the patient or authorized representative who has indicated his/her understanding and acceptance.   Dental advisory given  Plan Discussed with: CRNA and Surgeon  Anesthesia Plan Comments:        Anesthesia Quick Evaluation

## 2015-08-18 ENCOUNTER — Inpatient Hospital Stay (HOSPITAL_COMMUNITY): Payer: Medicare Other

## 2015-08-18 ENCOUNTER — Inpatient Hospital Stay (HOSPITAL_COMMUNITY)
Admission: RE | Admit: 2015-08-18 | Discharge: 2015-08-23 | DRG: 220 | Disposition: A | Discharge status: Home Health | Payer: Medicare Other | Source: Ambulatory Visit | Attending: Thoracic Surgery (Cardiothoracic Vascular Surgery) | Admitting: Thoracic Surgery (Cardiothoracic Vascular Surgery)

## 2015-08-18 ENCOUNTER — Inpatient Hospital Stay (HOSPITAL_COMMUNITY): Payer: Medicare Other | Admitting: Anesthesiology

## 2015-08-18 ENCOUNTER — Encounter (HOSPITAL_COMMUNITY)
Admission: RE | Disposition: A | Discharge status: Home Health | Payer: Medicare Other | Source: Ambulatory Visit | Attending: Thoracic Surgery (Cardiothoracic Vascular Surgery)

## 2015-08-18 ENCOUNTER — Encounter (HOSPITAL_COMMUNITY): Payer: Self-pay | Admitting: Thoracic Surgery (Cardiothoracic Vascular Surgery)

## 2015-08-18 DIAGNOSIS — J939 Pneumothorax, unspecified: Secondary | ICD-10-CM

## 2015-08-18 DIAGNOSIS — J9811 Atelectasis: Secondary | ICD-10-CM | POA: Diagnosis not present

## 2015-08-18 DIAGNOSIS — G4733 Obstructive sleep apnea (adult) (pediatric): Secondary | ICD-10-CM | POA: Diagnosis present

## 2015-08-18 DIAGNOSIS — Z791 Long term (current) use of non-steroidal anti-inflammatories (NSAID): Secondary | ICD-10-CM

## 2015-08-18 DIAGNOSIS — Z8249 Family history of ischemic heart disease and other diseases of the circulatory system: Secondary | ICD-10-CM | POA: Diagnosis not present

## 2015-08-18 DIAGNOSIS — D62 Acute posthemorrhagic anemia: Secondary | ICD-10-CM | POA: Diagnosis not present

## 2015-08-18 DIAGNOSIS — I350 Nonrheumatic aortic (valve) stenosis: Principal | ICD-10-CM | POA: Diagnosis present

## 2015-08-18 DIAGNOSIS — Z923 Personal history of irradiation: Secondary | ICD-10-CM

## 2015-08-18 DIAGNOSIS — M1712 Unilateral primary osteoarthritis, left knee: Secondary | ICD-10-CM | POA: Diagnosis present

## 2015-08-18 DIAGNOSIS — I5032 Chronic diastolic (congestive) heart failure: Secondary | ICD-10-CM | POA: Diagnosis not present

## 2015-08-18 DIAGNOSIS — Z6836 Body mass index (BMI) 36.0-36.9, adult: Secondary | ICD-10-CM | POA: Diagnosis not present

## 2015-08-18 DIAGNOSIS — Z833 Family history of diabetes mellitus: Secondary | ICD-10-CM

## 2015-08-18 DIAGNOSIS — I2510 Atherosclerotic heart disease of native coronary artery without angina pectoris: Secondary | ICD-10-CM | POA: Diagnosis present

## 2015-08-18 DIAGNOSIS — R0602 Shortness of breath: Secondary | ICD-10-CM | POA: Diagnosis present

## 2015-08-18 DIAGNOSIS — E669 Obesity, unspecified: Secondary | ICD-10-CM | POA: Diagnosis present

## 2015-08-18 DIAGNOSIS — E119 Type 2 diabetes mellitus without complications: Secondary | ICD-10-CM | POA: Diagnosis present

## 2015-08-18 DIAGNOSIS — Z953 Presence of xenogenic heart valve: Secondary | ICD-10-CM

## 2015-08-18 DIAGNOSIS — J95811 Postprocedural pneumothorax: Secondary | ICD-10-CM | POA: Diagnosis not present

## 2015-08-18 DIAGNOSIS — L03113 Cellulitis of right upper limb: Secondary | ICD-10-CM | POA: Diagnosis not present

## 2015-08-18 DIAGNOSIS — Z87891 Personal history of nicotine dependence: Secondary | ICD-10-CM

## 2015-08-18 DIAGNOSIS — Z7982 Long term (current) use of aspirin: Secondary | ICD-10-CM

## 2015-08-18 DIAGNOSIS — Z85828 Personal history of other malignant neoplasm of skin: Secondary | ICD-10-CM

## 2015-08-18 DIAGNOSIS — Z794 Long term (current) use of insulin: Secondary | ICD-10-CM

## 2015-08-18 HISTORY — PX: TEE WITHOUT CARDIOVERSION: SHX5443

## 2015-08-18 HISTORY — DX: Presence of xenogenic heart valve: Z95.3

## 2015-08-18 HISTORY — PX: AORTIC VALVE REPLACEMENT: SHX41

## 2015-08-18 LAB — POCT I-STAT, CHEM 8
BUN: 29 mg/dL — AB (ref 6–20)
BUN: 26 mg/dL — AB (ref 6–20)
BUN: 27 mg/dL — AB (ref 6–20)
BUN: 29 mg/dL — AB (ref 6–20)
BUN: 27 mg/dL — AB (ref 6–20)
BUN: 35 mg/dL — AB (ref 6–20)
BUN: 23 mg/dL — ABNORMAL HIGH (ref 6–20)
CALCIUM ION: 1.23 mmol/L (ref 1.13–1.30)
CALCIUM ION: 1.23 mmol/L (ref 1.13–1.30)
CALCIUM ION: 1.05 mmol/L — AB (ref 1.13–1.30)
CALCIUM ION: 1.16 mmol/L (ref 1.13–1.30)
CHLORIDE: 106 mmol/L (ref 101–111)
CHLORIDE: 106 mmol/L (ref 101–111)
CHLORIDE: 106 mmol/L (ref 101–111)
CHLORIDE: 104 mmol/L (ref 101–111)
CHLORIDE: 103 mmol/L (ref 101–111)
CREATININE: 0.9 mg/dL (ref 0.61–1.24)
CREATININE: 1 mg/dL (ref 0.61–1.24)
CREATININE: 1 mg/dL (ref 0.61–1.24)
CREATININE: 1 mg/dL (ref 0.61–1.24)
CREATININE: 1 mg/dL (ref 0.61–1.24)
Calcium, Ion: 1.12 mmol/L — ABNORMAL LOW (ref 1.13–1.30)
Calcium, Ion: 1.13 mmol/L (ref 1.13–1.30)
Calcium, Ion: 1.13 mmol/L (ref 1.13–1.30)
Chloride: 102 mmol/L (ref 101–111)
Chloride: 108 mmol/L (ref 101–111)
Creatinine, Ser: 1 mg/dL (ref 0.61–1.24)
Creatinine, Ser: 0.9 mg/dL (ref 0.61–1.24)
GLUCOSE: 120 mg/dL — AB (ref 65–99)
GLUCOSE: 149 mg/dL — AB (ref 65–99)
GLUCOSE: 187 mg/dL — AB (ref 65–99)
GLUCOSE: 121 mg/dL — AB (ref 65–99)
Glucose, Bld: 137 mg/dL — ABNORMAL HIGH (ref 65–99)
Glucose, Bld: 119 mg/dL — ABNORMAL HIGH (ref 65–99)
Glucose, Bld: 130 mg/dL — ABNORMAL HIGH (ref 65–99)
HCT: 39 % (ref 39.0–52.0)
HCT: 32 % — ABNORMAL LOW (ref 39.0–52.0)
HCT: 36 % — ABNORMAL LOW (ref 39.0–52.0)
HCT: 31 % — ABNORMAL LOW (ref 39.0–52.0)
HCT: 31 % — ABNORMAL LOW (ref 39.0–52.0)
HCT: 34 % — ABNORMAL LOW (ref 39.0–52.0)
HEMATOCRIT: 30 % — AB (ref 39.0–52.0)
HEMOGLOBIN: 10.9 g/dL — AB (ref 13.0–17.0)
HEMOGLOBIN: 11.6 g/dL — AB (ref 13.0–17.0)
Hemoglobin: 13.3 g/dL (ref 13.0–17.0)
Hemoglobin: 10.2 g/dL — ABNORMAL LOW (ref 13.0–17.0)
Hemoglobin: 12.2 g/dL — ABNORMAL LOW (ref 13.0–17.0)
Hemoglobin: 10.5 g/dL — ABNORMAL LOW (ref 13.0–17.0)
Hemoglobin: 10.5 g/dL — ABNORMAL LOW (ref 13.0–17.0)
POTASSIUM: 5.8 mmol/L — AB (ref 3.5–5.1)
Potassium: 4.9 mmol/L (ref 3.5–5.1)
Potassium: 4.4 mmol/L (ref 3.5–5.1)
Potassium: 4.9 mmol/L (ref 3.5–5.1)
Potassium: 7.3 mmol/L (ref 3.5–5.1)
Potassium: 6.1 mmol/L (ref 3.5–5.1)
Potassium: 4.2 mmol/L (ref 3.5–5.1)
SODIUM: 140 mmol/L (ref 135–145)
SODIUM: 142 mmol/L (ref 135–145)
Sodium: 138 mmol/L (ref 135–145)
Sodium: 139 mmol/L (ref 135–145)
Sodium: 141 mmol/L (ref 135–145)
Sodium: 139 mmol/L (ref 135–145)
Sodium: 137 mmol/L (ref 135–145)
TCO2: 25 mmol/L (ref 0–100)
TCO2: 24 mmol/L (ref 0–100)
TCO2: 25 mmol/L (ref 0–100)
TCO2: 26 mmol/L (ref 0–100)
TCO2: 25 mmol/L (ref 0–100)
TCO2: 25 mmol/L (ref 0–100)
TCO2: 23 mmol/L (ref 0–100)

## 2015-08-18 LAB — CBC
HEMATOCRIT: 40.7 % (ref 39.0–52.0)
HEMATOCRIT: 36.8 % — AB (ref 39.0–52.0)
HEMOGLOBIN: 11.7 g/dL — AB (ref 13.0–17.0)
Hemoglobin: 13 g/dL (ref 13.0–17.0)
MCH: 28.8 pg (ref 26.0–34.0)
MCH: 28.5 pg (ref 26.0–34.0)
MCHC: 31.9 g/dL (ref 30.0–36.0)
MCHC: 31.8 g/dL (ref 30.0–36.0)
MCV: 90.2 fL (ref 78.0–100.0)
MCV: 89.8 fL (ref 78.0–100.0)
PLATELETS: 185 10*3/uL (ref 150–400)
Platelets: 153 10*3/uL (ref 150–400)
RBC: 4.51 MIL/uL (ref 4.22–5.81)
RBC: 4.1 MIL/uL — ABNORMAL LOW (ref 4.22–5.81)
RDW: 14.3 % (ref 11.5–15.5)
RDW: 14.4 % (ref 11.5–15.5)
WBC: 22.1 10*3/uL — AB (ref 4.0–10.5)
WBC: 13.7 10*3/uL — ABNORMAL HIGH (ref 4.0–10.5)

## 2015-08-18 LAB — POCT I-STAT 3, ART BLOOD GAS (G3+)
ACID-BASE DEFICIT: 1 mmol/L (ref 0.0–2.0)
ACID-BASE DEFICIT: 5 mmol/L — AB (ref 0.0–2.0)
ACID-BASE DEFICIT: 2 mmol/L (ref 0.0–2.0)
Acid-base deficit: 1 mmol/L (ref 0.0–2.0)
Acid-base deficit: 4 mmol/L — ABNORMAL HIGH (ref 0.0–2.0)
Acid-base deficit: 4 mmol/L — ABNORMAL HIGH (ref 0.0–2.0)
Acid-base deficit: 2 mmol/L (ref 0.0–2.0)
BICARBONATE: 24.4 meq/L — AB (ref 20.0–24.0)
BICARBONATE: 20.3 meq/L (ref 20.0–24.0)
BICARBONATE: 23.8 meq/L (ref 20.0–24.0)
Bicarbonate: 24.2 mEq/L — ABNORMAL HIGH (ref 20.0–24.0)
Bicarbonate: 21.8 mEq/L (ref 20.0–24.0)
Bicarbonate: 23.1 mEq/L (ref 20.0–24.0)
Bicarbonate: 24 mEq/L (ref 20.0–24.0)
O2 SAT: 100 %
O2 SAT: 100 %
O2 SAT: 94 %
O2 SAT: 94 %
O2 SAT: 94 %
O2 Saturation: 92 %
O2 Saturation: 96 %
PCO2 ART: 44.1 mmHg (ref 35.0–45.0)
PCO2 ART: 33.9 mmHg — AB (ref 35.0–45.0)
PCO2 ART: 44.9 mmHg (ref 35.0–45.0)
PH ART: 7.348 — AB (ref 7.350–7.450)
PH ART: 7.268 — AB (ref 7.350–7.450)
PH ART: 7.337 — AB (ref 7.350–7.450)
PO2 ART: 71 mmHg — AB (ref 80.0–100.0)
PO2 ART: 74 mmHg — AB (ref 80.0–100.0)
TCO2: 26 mmol/L (ref 0–100)
TCO2: 26 mmol/L (ref 0–100)
TCO2: 23 mmol/L (ref 0–100)
TCO2: 21 mmol/L (ref 0–100)
TCO2: 25 mmol/L (ref 0–100)
TCO2: 25 mmol/L (ref 0–100)
TCO2: 25 mmol/L (ref 0–100)
pCO2 arterial: 43.2 mmHg (ref 35.0–45.0)
pCO2 arterial: 47.9 mmHg — ABNORMAL HIGH (ref 35.0–45.0)
pCO2 arterial: 45 mmHg (ref 35.0–45.0)
pCO2 arterial: 48.5 mmHg — ABNORMAL HIGH (ref 35.0–45.0)
pH, Arterial: 7.359 (ref 7.350–7.450)
pH, Arterial: 7.385 (ref 7.350–7.450)
pH, Arterial: 7.331 — ABNORMAL LOW (ref 7.350–7.450)
pH, Arterial: 7.288 — ABNORMAL LOW (ref 7.350–7.450)
pO2, Arterial: 219 mmHg — ABNORMAL HIGH (ref 80.0–100.0)
pO2, Arterial: 312 mmHg — ABNORMAL HIGH (ref 80.0–100.0)
pO2, Arterial: 75 mmHg — ABNORMAL LOW (ref 80.0–100.0)
pO2, Arterial: 79 mmHg — ABNORMAL LOW (ref 80.0–100.0)
pO2, Arterial: 93 mmHg (ref 80.0–100.0)

## 2015-08-18 LAB — MAGNESIUM: Magnesium: 2.9 mg/dL — ABNORMAL HIGH (ref 1.7–2.4)

## 2015-08-18 LAB — CREATININE, SERUM
Creatinine, Ser: 1.04 mg/dL (ref 0.61–1.24)
GFR calc Af Amer: >60 mL/min (ref >60)
GFR calc non Af Amer: >60 mL/min (ref >60)

## 2015-08-18 LAB — GLUCOSE, CAPILLARY
GLUCOSE-CAPILLARY: 126 mg/dL — AB (ref 65–99)
GLUCOSE-CAPILLARY: 120 mg/dL — AB (ref 65–99)
GLUCOSE-CAPILLARY: 107 mg/dL — AB (ref 65–99)
GLUCOSE-CAPILLARY: 102 mg/dL — AB (ref 65–99)
Glucose-Capillary: 97 mg/dL (ref 65–99)

## 2015-08-18 LAB — PROTIME-INR
INR: 1.38 (ref 0.00–1.49)
Prothrombin Time: 17.1 seconds — ABNORMAL HIGH (ref 11.6–15.2)

## 2015-08-18 LAB — POCT I-STAT 4, (NA,K, GLUC, HGB,HCT)
GLUCOSE: 148 mg/dL — AB (ref 65–99)
HCT: 40 % (ref 39.0–52.0)
Hemoglobin: 13.6 g/dL (ref 13.0–17.0)
POTASSIUM: 4.9 mmol/L (ref 3.5–5.1)
SODIUM: 139 mmol/L (ref 135–145)

## 2015-08-18 LAB — APTT: APTT: 37 s (ref 24–37)

## 2015-08-18 LAB — HEMOGLOBIN AND HEMATOCRIT, BLOOD
HCT: 31.7 % — ABNORMAL LOW (ref 39.0–52.0)
HEMOGLOBIN: 10 g/dL — AB (ref 13.0–17.0)

## 2015-08-18 LAB — PLATELET COUNT: Platelets: 240 10*3/uL (ref 150–400)

## 2015-08-18 SURGERY — REPLACEMENT, AORTIC VALVE, OPEN
Anesthesia: General | Site: Chest

## 2015-08-18 MED ORDER — FENTANYL CITRATE (PF) 250 MCG/5ML IJ SOLN
INTRAMUSCULAR | Status: AC
  Filled 2015-08-18: qty 20

## 2015-08-18 MED ORDER — METOPROLOL TARTRATE 1 MG/ML IV SOLN: 2.5000 mg | INTRAVENOUS | Status: DC | PRN

## 2015-08-18 MED ORDER — FENTANYL CITRATE (PF) 100 MCG/2ML IJ SOLN
INTRAMUSCULAR | Status: DC | PRN
  Administered 2015-08-18: 350 ug via INTRAVENOUS
  Administered 2015-08-18: 150 ug via INTRAVENOUS
  Administered 2015-08-18: 100 ug via INTRAVENOUS
  Administered 2015-08-18: 200 ug via INTRAVENOUS
  Administered 2015-08-18: 50 ug via INTRAVENOUS
  Administered 2015-08-18: 150 ug via INTRAVENOUS
  Administered 2015-08-18 (×2): 50 ug via INTRAVENOUS
  Administered 2015-08-18 (×2): 100 ug via INTRAVENOUS

## 2015-08-18 MED ORDER — HEPARIN SODIUM (PORCINE) 1000 UNIT/ML IJ SOLN
INTRAMUSCULAR | Status: DC | PRN
  Administered 2015-08-18: 40000 [IU] via INTRAVENOUS

## 2015-08-18 MED ORDER — SUCCINYLCHOLINE CHLORIDE 20 MG/ML IJ SOLN
INTRAMUSCULAR | Status: DC | PRN
  Administered 2015-08-18: 120 mg via INTRAVENOUS

## 2015-08-18 MED ORDER — SODIUM BICARBONATE 8.4 % IV SOLN: 50.0000 meq | Freq: Once | INTRAVENOUS | Status: DC

## 2015-08-18 MED ORDER — ARTIFICIAL TEARS OP OINT
TOPICAL_OINTMENT | OPHTHALMIC | Status: AC
  Filled 2015-08-18: qty 3.5

## 2015-08-18 MED ORDER — ROCURONIUM BROMIDE 50 MG/5ML IV SOLN
INTRAVENOUS | Status: AC
  Filled 2015-08-18: qty 2

## 2015-08-18 MED ORDER — METOPROLOL TARTRATE 12.5 MG HALF TABLET
12.5000 mg | ORAL_TABLET | Freq: Two times a day (BID) | ORAL | Status: DC
  Administered 2015-08-19: 12.5 mg via ORAL
  Filled 2015-08-18: qty 1

## 2015-08-18 MED ORDER — SODIUM CHLORIDE 0.9 % IJ SOLN
3.0000 mL | Freq: Two times a day (BID) | INTRAMUSCULAR | Status: DC
  Administered 2015-08-19 – 2015-08-23 (×3): 3 mL via INTRAVENOUS

## 2015-08-18 MED ORDER — ONDANSETRON HCL 4 MG/2ML IJ SOLN: 4.0000 mg | Freq: Four times a day (QID) | INTRAMUSCULAR | Status: DC | PRN

## 2015-08-18 MED ORDER — SODIUM CHLORIDE 0.9 % IV SOLN
INTRAVENOUS | Status: DC | PRN
  Administered 2015-08-18: 09:00:00 via INTRAVENOUS

## 2015-08-18 MED ORDER — PANTOPRAZOLE SODIUM 40 MG PO TBEC
40.0000 mg | DELAYED_RELEASE_TABLET | Freq: Every day | ORAL | Status: DC
  Administered 2015-08-20 – 2015-08-23 (×4): 40 mg via ORAL
  Filled 2015-08-18 (×4): qty 1

## 2015-08-18 MED ORDER — ESMOLOL HCL 100 MG/10ML IV SOLN
INTRAVENOUS | Status: AC
  Filled 2015-08-18: qty 10

## 2015-08-18 MED ORDER — ROCURONIUM BROMIDE 50 MG/5ML IV SOLN
INTRAVENOUS | Status: AC
  Filled 2015-08-18: qty 1

## 2015-08-18 MED ORDER — PHENYLEPHRINE HCL 10 MG/ML IJ SOLN
0.0000 ug/min | INTRAVENOUS | Status: DC
  Filled 2015-08-18: qty 2

## 2015-08-18 MED ORDER — SODIUM BICARBONATE 8.4 % IV SOLN
50.0000 meq | Freq: Once | INTRAVENOUS | Status: AC
  Administered 2015-08-18: 50 meq via INTRAVENOUS

## 2015-08-18 MED ORDER — EPHEDRINE SULFATE 50 MG/ML IJ SOLN
INTRAMUSCULAR | Status: AC
  Filled 2015-08-18: qty 1

## 2015-08-18 MED ORDER — SODIUM CHLORIDE 0.9 % IJ SOLN
OROMUCOSAL | Status: DC | PRN
  Administered 2015-08-18 (×3): 4 mL via TOPICAL

## 2015-08-18 MED ORDER — PHENYLEPHRINE 40 MCG/ML (10ML) SYRINGE FOR IV PUSH (FOR BLOOD PRESSURE SUPPORT)
PREFILLED_SYRINGE | INTRAVENOUS | Status: AC
  Filled 2015-08-18: qty 10

## 2015-08-18 MED ORDER — PHENYLEPHRINE 40 MCG/ML (10ML) SYRINGE FOR IV PUSH (FOR BLOOD PRESSURE SUPPORT)
PREFILLED_SYRINGE | INTRAVENOUS | Status: AC
  Filled 2015-08-18: qty 30

## 2015-08-18 MED ORDER — SODIUM CHLORIDE 0.9 % IV SOLN
250.0000 mL | INTRAVENOUS | Status: DC
  Administered 2015-08-19: 250 mL via INTRAVENOUS

## 2015-08-18 MED ORDER — LACTATED RINGERS IV SOLN
INTRAVENOUS | Status: DC
  Administered 2015-08-18: 10 mL/h via INTRAVENOUS

## 2015-08-18 MED ORDER — PHENYLEPHRINE HCL 10 MG/ML IJ SOLN
INTRAMUSCULAR | Status: DC | PRN
  Administered 2015-08-18: 80 ug via INTRAVENOUS
  Administered 2015-08-18: 160 ug via INTRAVENOUS
  Administered 2015-08-18: 40 ug via INTRAVENOUS
  Administered 2015-08-18: 80 ug via INTRAVENOUS
  Administered 2015-08-18: 160 ug via INTRAVENOUS
  Administered 2015-08-18 (×2): 80 ug via INTRAVENOUS
  Administered 2015-08-18: 40 ug via INTRAVENOUS
  Administered 2015-08-18: 80 ug via INTRAVENOUS
  Administered 2015-08-18: 120 ug via INTRAVENOUS

## 2015-08-18 MED ORDER — DOCUSATE SODIUM 100 MG PO CAPS
200.0000 mg | ORAL_CAPSULE | Freq: Every day | ORAL | Status: DC
  Administered 2015-08-19 – 2015-08-23 (×5): 200 mg via ORAL
  Filled 2015-08-18 (×5): qty 2

## 2015-08-18 MED ORDER — MIDAZOLAM HCL 2 MG/2ML IJ SOLN: 2.0000 mg | INTRAMUSCULAR | Status: DC | PRN

## 2015-08-18 MED ORDER — INSULIN REGULAR HUMAN 100 UNIT/ML IJ SOLN
INTRAMUSCULAR | Status: DC
  Administered 2015-08-18: 3.2 [IU]/h via INTRAVENOUS
  Administered 2015-08-19: 3.6 [IU]/h via INTRAVENOUS
  Filled 2015-08-18 (×3): qty 2.5

## 2015-08-18 MED ORDER — DEXTROSE 5 % IV SOLN
1.5000 g | Freq: Two times a day (BID) | INTRAVENOUS | Status: AC
  Administered 2015-08-18 – 2015-08-20 (×4): 1.5 g via INTRAVENOUS
  Filled 2015-08-18 (×4): qty 1.5

## 2015-08-18 MED ORDER — ANTISEPTIC ORAL RINSE SOLUTION (CORINZ)
7.0000 mL | Freq: Four times a day (QID) | OROMUCOSAL | Status: DC
  Administered 2015-08-19 (×4): 7 mL via OROMUCOSAL

## 2015-08-18 MED ORDER — MAGNESIUM SULFATE 4 GM/100ML IV SOLN
4.0000 g | Freq: Once | INTRAVENOUS | Status: AC
  Administered 2015-08-18: 4 g via INTRAVENOUS
  Filled 2015-08-18: qty 100

## 2015-08-18 MED ORDER — OXYCODONE HCL 5 MG PO TABS
5.0000 mg | ORAL_TABLET | ORAL | Status: DC | PRN
  Administered 2015-08-19: 5 mg via ORAL
  Administered 2015-08-19 – 2015-08-20 (×3): 10 mg via ORAL
  Administered 2015-08-20 (×2): 5 mg via ORAL
  Administered 2015-08-21 (×5): 10 mg via ORAL
  Filled 2015-08-18: qty 2
  Filled 2015-08-18: qty 1
  Filled 2015-08-18: qty 2
  Filled 2015-08-18: qty 1
  Filled 2015-08-18 (×6): qty 2
  Filled 2015-08-18: qty 1

## 2015-08-18 MED ORDER — NITROGLYCERIN 0.2 MG/ML ON CALL CATH LAB
INTRAVENOUS | Status: DC | PRN
  Administered 2015-08-18: 40 ug via INTRAVENOUS
  Administered 2015-08-18: 80 ug via INTRAVENOUS

## 2015-08-18 MED ORDER — VANCOMYCIN HCL IN DEXTROSE 1-5 GM/200ML-% IV SOLN
1000.0000 mg | Freq: Once | INTRAVENOUS | Status: AC
  Administered 2015-08-18: 1000 mg via INTRAVENOUS
  Filled 2015-08-18: qty 200

## 2015-08-18 MED ORDER — HEPARIN SODIUM (PORCINE) 1000 UNIT/ML IJ SOLN
INTRAMUSCULAR | Status: AC
  Filled 2015-08-18: qty 1

## 2015-08-18 MED ORDER — PROPOFOL 10 MG/ML IV BOLUS
INTRAVENOUS | Status: DC | PRN
  Administered 2015-08-18: 30 mg via INTRAVENOUS

## 2015-08-18 MED ORDER — ARTIFICIAL TEARS OP OINT
TOPICAL_OINTMENT | OPHTHALMIC | Status: DC | PRN
  Administered 2015-08-18: 1 via OPHTHALMIC

## 2015-08-18 MED ORDER — SODIUM CHLORIDE 0.9 % IJ SOLN
INTRAMUSCULAR | Status: AC
  Filled 2015-08-18: qty 10

## 2015-08-18 MED ORDER — PROTAMINE SULFATE 10 MG/ML IV SOLN
INTRAVENOUS | Status: DC | PRN
  Administered 2015-08-18: 360 mg via INTRAVENOUS
  Administered 2015-08-18: 20 mg via INTRAVENOUS

## 2015-08-18 MED ORDER — ACETAMINOPHEN 500 MG PO TABS
1000.0000 mg | ORAL_TABLET | Freq: Four times a day (QID) | ORAL | Status: DC
  Administered 2015-08-19 – 2015-08-23 (×14): 1000 mg via ORAL
  Filled 2015-08-18 (×14): qty 2

## 2015-08-18 MED ORDER — GLYCOPYRROLATE 0.2 MG/ML IJ SOLN
INTRAMUSCULAR | Status: AC
  Filled 2015-08-18: qty 1

## 2015-08-18 MED ORDER — ASPIRIN EC 325 MG PO TBEC
325.0000 mg | DELAYED_RELEASE_TABLET | Freq: Every day | ORAL | Status: DC
  Administered 2015-08-19: 325 mg via ORAL
  Filled 2015-08-18: qty 1

## 2015-08-18 MED ORDER — SODIUM CHLORIDE 0.9 % IJ SOLN: 3.0000 mL | INTRAMUSCULAR | Status: DC | PRN

## 2015-08-18 MED ORDER — DEXMEDETOMIDINE HCL IN NACL 200 MCG/50ML IV SOLN
INTRAVENOUS | Status: AC
  Filled 2015-08-18: qty 50

## 2015-08-18 MED ORDER — ACETAMINOPHEN 160 MG/5ML PO SOLN: 1000.0000 mg | Freq: Four times a day (QID) | ORAL | Status: DC

## 2015-08-18 MED ORDER — INSULIN REGULAR BOLUS VIA INFUSION
0.0000 [IU] | Freq: Three times a day (TID) | INTRAVENOUS | Status: DC
  Administered 2015-08-19: 2 [IU] via INTRAVENOUS
  Filled 2015-08-18: qty 10

## 2015-08-18 MED ORDER — BISACODYL 5 MG PO TBEC
10.0000 mg | DELAYED_RELEASE_TABLET | Freq: Every day | ORAL | Status: DC
Start: 2015-08-19 — End: 2015-08-23
  Administered 2015-08-19 – 2015-08-23 (×3): 10 mg via ORAL
  Filled 2015-08-18 (×4): qty 2

## 2015-08-18 MED ORDER — SODIUM CHLORIDE 0.9 % IV SOLN
INTRAVENOUS | Status: DC
  Administered 2015-08-18: 20 mL/h via INTRAVENOUS

## 2015-08-18 MED ORDER — FAMOTIDINE IN NACL 20-0.9 MG/50ML-% IV SOLN
20.0000 mg | Freq: Two times a day (BID) | INTRAVENOUS | Status: AC
  Administered 2015-08-18 (×2): 20 mg via INTRAVENOUS
  Filled 2015-08-18: qty 50

## 2015-08-18 MED ORDER — ASPIRIN 81 MG PO CHEW: 324.0000 mg | CHEWABLE_TABLET | Freq: Every day | ORAL | Status: DC

## 2015-08-18 MED ORDER — SUCCINYLCHOLINE CHLORIDE 20 MG/ML IJ SOLN
INTRAMUSCULAR | Status: AC
  Filled 2015-08-18: qty 1

## 2015-08-18 MED ORDER — MIDAZOLAM HCL 10 MG/2ML IJ SOLN
INTRAMUSCULAR | Status: AC
  Filled 2015-08-18: qty 2

## 2015-08-18 MED ORDER — NITROGLYCERIN IN D5W 200-5 MCG/ML-% IV SOLN: 0.0000 ug/min | INTRAVENOUS | Status: DC

## 2015-08-18 MED ORDER — METOPROLOL TARTRATE 25 MG/10 ML ORAL SUSPENSION: 12.5000 mg | Freq: Two times a day (BID) | ORAL | Status: DC

## 2015-08-18 MED ORDER — LACTATED RINGERS IV SOLN
500.0000 mL | Freq: Once | INTRAVENOUS | Status: DC | PRN
Start: 2015-08-18 — End: 2015-08-19

## 2015-08-18 MED ORDER — SODIUM CHLORIDE 0.9 % IV SOLN
INTRAVENOUS | Status: DC
  Administered 2015-08-18: 100 mL/h via INTRAVENOUS

## 2015-08-18 MED ORDER — ACETAMINOPHEN 650 MG RE SUPP
650.0000 mg | Freq: Once | RECTAL | Status: AC
  Administered 2015-08-18: 650 mg via RECTAL

## 2015-08-18 MED ORDER — LACTATED RINGERS IV SOLN
INTRAVENOUS | Status: DC | PRN
  Administered 2015-08-18: 08:00:00 via INTRAVENOUS

## 2015-08-18 MED ORDER — LACTATED RINGERS IV SOLN
INTRAVENOUS | Status: DC
  Administered 2015-08-18: 20 mL/h via INTRAVENOUS

## 2015-08-18 MED ORDER — CHLORHEXIDINE GLUCONATE 0.12% ORAL RINSE (MEDLINE KIT)
15.0000 mL | Freq: Two times a day (BID) | OROMUCOSAL | Status: DC
  Administered 2015-08-18 – 2015-08-19 (×3): 15 mL via OROMUCOSAL

## 2015-08-18 MED ORDER — PROPOFOL 10 MG/ML IV BOLUS
INTRAVENOUS | Status: AC
Start: 2015-08-18 — End: 2015-08-18
  Filled 2015-08-18: qty 20

## 2015-08-18 MED ORDER — PROTAMINE SULFATE 10 MG/ML IV SOLN
INTRAVENOUS | Status: AC
Start: 2015-08-18 — End: 2015-08-18
  Filled 2015-08-18: qty 10

## 2015-08-18 MED ORDER — POTASSIUM CHLORIDE 10 MEQ/50ML IV SOLN
10.0000 meq | INTRAVENOUS | Status: DC
Start: 2015-08-18 — End: 2015-08-18

## 2015-08-18 MED ORDER — ALBUMIN HUMAN 5 % IV SOLN
250.0000 mL | INTRAVENOUS | Status: AC | PRN
  Administered 2015-08-18: 250 mL via INTRAVENOUS
  Filled 2015-08-18: qty 250

## 2015-08-18 MED ORDER — SODIUM CHLORIDE 0.9 % IR SOLN
Status: DC | PRN
  Administered 2015-08-18: 6000 mL

## 2015-08-18 MED ORDER — BISACODYL 10 MG RE SUPP
10.0000 mg | Freq: Every day | RECTAL | Status: DC
  Filled 2015-08-18: qty 1

## 2015-08-18 MED ORDER — MORPHINE SULFATE (PF) 2 MG/ML IV SOLN: 1.0000 mg | INTRAVENOUS | Status: DC | PRN

## 2015-08-18 MED ORDER — ACETAMINOPHEN 160 MG/5ML PO SOLN: 650.0000 mg | Freq: Once | ORAL | Status: AC

## 2015-08-18 MED ORDER — ROCURONIUM BROMIDE 100 MG/10ML IV SOLN
INTRAVENOUS | Status: DC | PRN
  Administered 2015-08-18: 10 mg via INTRAVENOUS
  Administered 2015-08-18: 100 mg via INTRAVENOUS
  Administered 2015-08-18: 90 mg via INTRAVENOUS
  Administered 2015-08-18: 50 mg via INTRAVENOUS

## 2015-08-18 MED ORDER — PHENYLEPHRINE HCL 10 MG/ML IJ SOLN
10.0000 mg | INTRAVENOUS | Status: DC | PRN
  Administered 2015-08-18: 20 ug/min via INTRAVENOUS

## 2015-08-18 MED ORDER — FENTANYL CITRATE (PF) 250 MCG/5ML IJ SOLN
INTRAMUSCULAR | Status: AC
Start: 2015-08-18 — End: 2015-08-18
  Filled 2015-08-18: qty 10

## 2015-08-18 MED ORDER — CHLORHEXIDINE GLUCONATE 0.12 % MT SOLN
15.0000 mL | OROMUCOSAL | Status: AC
  Administered 2015-08-18: 15 mL via OROMUCOSAL
  Filled 2015-08-18: qty 15

## 2015-08-18 MED ORDER — MIDAZOLAM HCL 5 MG/5ML IJ SOLN
INTRAMUSCULAR | Status: DC | PRN
  Administered 2015-08-18: 2 mg via INTRAVENOUS
  Administered 2015-08-18: 1 mg via INTRAVENOUS
  Administered 2015-08-18 (×2): 3 mg via INTRAVENOUS
  Administered 2015-08-18: 1 mg via INTRAVENOUS

## 2015-08-18 MED ORDER — TRAMADOL HCL 50 MG PO TABS
50.0000 mg | ORAL_TABLET | ORAL | Status: DC | PRN
  Administered 2015-08-20: 100 mg via ORAL
  Filled 2015-08-18: qty 2

## 2015-08-18 MED ORDER — DEXMEDETOMIDINE HCL IN NACL 200 MCG/50ML IV SOLN
0.0000 ug/kg/h | INTRAVENOUS | Status: DC
  Administered 2015-08-18: 0.5 ug/kg/h via INTRAVENOUS
  Administered 2015-08-18: 0.7 ug/kg/h via INTRAVENOUS
  Filled 2015-08-18: qty 50

## 2015-08-18 MED ORDER — LACTATED RINGERS IV SOLN
INTRAVENOUS | Status: DC | PRN
  Administered 2015-08-18 (×2): via INTRAVENOUS

## 2015-08-18 MED ORDER — MORPHINE SULFATE (PF) 2 MG/ML IV SOLN
2.0000 mg | INTRAVENOUS | Status: DC | PRN
  Administered 2015-08-18: 4 mg via INTRAVENOUS
  Administered 2015-08-18 (×4): 2 mg via INTRAVENOUS
  Administered 2015-08-19: 4 mg via INTRAVENOUS
  Filled 2015-08-18 (×2): qty 2
  Filled 2015-08-18 (×2): qty 1
  Filled 2015-08-18: qty 2

## 2015-08-18 MED ORDER — SODIUM CHLORIDE 0.45 % IV SOLN
INTRAVENOUS | Status: DC | PRN
  Administered 2015-08-18: 20 mL/h via INTRAVENOUS

## 2015-08-18 MED FILL — Mannitol IV Soln 20%: INTRAVENOUS | Qty: 500 | Status: AC

## 2015-08-18 MED FILL — Heparin Sodium (Porcine) Inj 1000 Unit/ML: INTRAMUSCULAR | Qty: 30 | Status: AC

## 2015-08-18 MED FILL — Lidocaine HCl IV Inj 20 MG/ML: INTRAVENOUS | Qty: 5 | Status: AC

## 2015-08-18 MED FILL — Electrolyte-R (PH 7.4) Solution: INTRAVENOUS | Qty: 5000 | Status: AC

## 2015-08-18 MED FILL — Sodium Bicarbonate IV Soln 8.4%: INTRAVENOUS | Qty: 50 | Status: AC

## 2015-08-18 MED FILL — Potassium Chloride Inj 2 mEq/ML: INTRAVENOUS | Qty: 40 | Status: AC

## 2015-08-18 MED FILL — Heparin Sodium (Porcine) Inj 1000 Unit/ML: INTRAMUSCULAR | Qty: 20 | Status: AC

## 2015-08-18 MED FILL — Sodium Chloride IV Soln 0.9%: INTRAVENOUS | Qty: 3000 | Status: AC

## 2015-08-18 MED FILL — Magnesium Sulfate Inj 50%: INTRAMUSCULAR | Qty: 10 | Status: AC

## 2015-08-18 SURGICAL SUPPLY — 88 items
ADAPTER CARDIO PERF ANTE/RETRO (ADAPTER) ×3 IMPLANT
ADPR PRFSN 84XANTGRD RTRGD (ADAPTER) ×2
BLADE STERNUM SYSTEM 6 (BLADE) ×3 IMPLANT
BLADE SURG 11 STRL SS (BLADE) ×3 IMPLANT
CANISTER SUCTION 2500CC (MISCELLANEOUS) ×3 IMPLANT
CANNULA EZ GLIDE AORTIC 21FR (CANNULA) ×4 IMPLANT
CANNULA FEMORAL ART 14 SM (MISCELLANEOUS) ×1 IMPLANT
CANNULA GUNDRY RCSP 15FR (MISCELLANEOUS) ×3 IMPLANT
CANNULA SOFTFLOW AORTIC 7M21FR (CANNULA) ×3 IMPLANT
CATH CPB KIT OWEN (MISCELLANEOUS) ×3 IMPLANT
CATH HEART VENT LEFT (CATHETERS) ×2 IMPLANT
CATH THORACIC 36FR (CATHETERS) ×1 IMPLANT
CONN 1/2X1/2X1/2  BEN (MISCELLANEOUS) ×1
CONN 1/2X1/2X1/2 BEN (MISCELLANEOUS) IMPLANT
CONT SPECI 4OZ STER CLIK (MISCELLANEOUS) ×1 IMPLANT
COVER PROBE W GEL 5X96 (DRAPES) ×1 IMPLANT
COVER SURGICAL LIGHT HANDLE (MISCELLANEOUS) ×3 IMPLANT
CRADLE DONUT ADULT HEAD (MISCELLANEOUS) ×3 IMPLANT
DEVICE SUT CK QUICK LOAD INDV (Prosthesis & Implant Heart) ×3 IMPLANT
DRAIN CHANNEL 32F RND 10.7 FF (WOUND CARE) ×3 IMPLANT
DRAPE CARDIOVASCULAR INCISE (DRAPES)
DRAPE INCISE IOBAN 66X45 STRL (DRAPES) ×6 IMPLANT
DRAPE SLUSH/WARMER DISC (DRAPES) ×3 IMPLANT
DRAPE SRG 135X102X78XABS (DRAPES) IMPLANT
DRSG AQUACEL AG ADV 3.5X14 (GAUZE/BANDAGES/DRESSINGS) ×1 IMPLANT
DRSG COVADERM 4X14 (GAUZE/BANDAGES/DRESSINGS) ×3 IMPLANT
ELECT BLADE 4.0 EZ CLEAN MEGAD (MISCELLANEOUS) ×3
ELECT REM PT RETURN 9FT ADLT (ELECTROSURGICAL) ×6
ELECTRODE BLDE 4.0 EZ CLN MEGD (MISCELLANEOUS) IMPLANT
ELECTRODE REM PT RTRN 9FT ADLT (ELECTROSURGICAL) ×4 IMPLANT
GAUZE SPONGE 4X4 12PLY STRL (GAUZE/BANDAGES/DRESSINGS) ×6 IMPLANT
GLOVE BIO SURGEON STRL SZ 6 (GLOVE) ×5 IMPLANT
GLOVE BIO SURGEON STRL SZ 6.5 (GLOVE) ×5 IMPLANT
GLOVE BIO SURGEON STRL SZ7 (GLOVE) ×1 IMPLANT
GLOVE BIO SURGEON STRL SZ7.5 (GLOVE) ×2 IMPLANT
GLOVE ORTHO TXT STRL SZ7.5 (GLOVE) ×9 IMPLANT
GOWN STRL REUS W/ TWL LRG LVL3 (GOWN DISPOSABLE) ×8 IMPLANT
GOWN STRL REUS W/TWL LRG LVL3 (GOWN DISPOSABLE) ×24
HEMOSTAT POWDER SURGIFOAM 1G (HEMOSTASIS) ×9 IMPLANT
INSERT FOGARTY XLG (MISCELLANEOUS) ×4 IMPLANT
KIT BASIN OR (CUSTOM PROCEDURE TRAY) ×3 IMPLANT
KIT DILATOR VASC 18G NDL (KITS) ×1 IMPLANT
KIT DRAINAGE VACCUM ASSIST (KITS) ×1 IMPLANT
KIT ROOM TURNOVER OR (KITS) ×3 IMPLANT
KIT SUCTION CATH 14FR (SUCTIONS) ×10 IMPLANT
LEAD PACING MYOCARDI (MISCELLANEOUS) ×4 IMPLANT
LINE VENT (MISCELLANEOUS) ×1 IMPLANT
NS IRRIG 1000ML POUR BTL (IV SOLUTION) ×17 IMPLANT
PACK OPEN HEART (CUSTOM PROCEDURE TRAY) ×3 IMPLANT
PAD ARMBOARD 7.5X6 YLW CONV (MISCELLANEOUS) ×6 IMPLANT
SET CARDIOPLEGIA MPS 5001102 (MISCELLANEOUS) ×1 IMPLANT
SET IRRIG TUBING LAPAROSCOPIC (IRRIGATION / IRRIGATOR) ×3 IMPLANT
SOLUTION ANTI FOG 6CC (MISCELLANEOUS) ×1 IMPLANT
SPONGE GAUZE 4X4 12PLY STER LF (GAUZE/BANDAGES/DRESSINGS) ×1 IMPLANT
SUT BONE WAX W31G (SUTURE) ×3 IMPLANT
SUT ETHIBON 2 0 V 52N 30 (SUTURE) ×6 IMPLANT
SUT ETHIBOND 2 0 SH (SUTURE) ×12
SUT ETHIBOND 2 0 SH 36X2 (SUTURE) IMPLANT
SUT ETHIBOND X763 2 0 SH 1 (SUTURE) ×16 IMPLANT
SUT MNCRL AB 3-0 PS2 18 (SUTURE) ×6 IMPLANT
SUT PDS AB 1 CTX 36 (SUTURE) ×8 IMPLANT
SUT PROLENE 3 0 SH DA (SUTURE) ×2 IMPLANT
SUT PROLENE 4 0 RB 1 (SUTURE) ×18
SUT PROLENE 4 0 SH DA (SUTURE) ×4 IMPLANT
SUT PROLENE 4-0 RB1 .5 CRCL 36 (SUTURE) ×4 IMPLANT
SUT PROLENE 5 0 C 1 36 (SUTURE) ×2 IMPLANT
SUT PROLENE 6 0 C 1 30 (SUTURE) ×2 IMPLANT
SUT SILK  1 MH (SUTURE) ×5
SUT SILK 1 MH (SUTURE) ×2 IMPLANT
SUT SILK 1 TIES 10X30 (SUTURE) ×1 IMPLANT
SUT SILK 2 0 SH CR/8 (SUTURE) ×2 IMPLANT
SUT SILK 3 0 SH CR/8 (SUTURE) ×1 IMPLANT
SUT SILK 4 0 TIE 10X30 (SUTURE) ×2 IMPLANT
SUT TEM PAC WIRE 2 0 SH (SUTURE) ×4 IMPLANT
SUT VIC AB 2-0 CTX 27 (SUTURE) ×4 IMPLANT
SUT VIC AB 3-0 X1 27 (SUTURE) ×2 IMPLANT
SYR 5ML LUER SLIP (SYRINGE) ×1 IMPLANT
SYSTEM SAHARA CHEST DRAIN ATS (WOUND CARE) ×3 IMPLANT
TAPE CLOTH SURG 4X10 WHT LF (GAUZE/BANDAGES/DRESSINGS) ×1 IMPLANT
TAPE PAPER 2X10 WHT MICROPORE (GAUZE/BANDAGES/DRESSINGS) ×1 IMPLANT
TOWEL OR 17X24 6PK STRL BLUE (TOWEL DISPOSABLE) ×6 IMPLANT
TOWEL OR 17X26 10 PK STRL BLUE (TOWEL DISPOSABLE) ×6 IMPLANT
TRAY CATH LUMEN 1 20CM STRL (SET/KITS/TRAYS/PACK) ×1 IMPLANT
TRAY FOLEY IC TEMP SENS 16FR (CATHETERS) ×3 IMPLANT
UNDERPAD 30X30 INCONTINENT (UNDERPADS AND DIAPERS) ×3 IMPLANT
VALVE SYSTEM EDWS INTUITY 25A (Prosthesis & Implant Heart) ×1 IMPLANT
VENT LEFT HEART 12002 (CATHETERS) ×3
WATER STERILE IRR 1000ML POUR (IV SOLUTION) ×6 IMPLANT

## 2015-08-18 NOTE — OR Nursing (Signed)
RN contacted 2S, providing a patient update. 

## 2015-08-18 NOTE — Brief Op Note (Addendum)
08/18/2015  12:23 PM      [address]       [place] [address]             [phone]     08/18/2015  12:23 PM  PATIENT:  Brent Cobb  69 y.o. male  PRE-OPERATIVE DIAGNOSIS:  AS  POST-OPERATIVE DIAGNOSIS:  AS  PROCEDURE:  Procedure(s): AORTIC VALVE REPLACEMENT (AVR) #25 INTUITY ELITE TRANSESOPHAGEAL ECHOCARDIOGRAM (TEE)  SURGEON:    [name], MD  ASSISTANTS:  John Giovanni, PA-C  ANESTHESIA:   Jillyn Hidden, MD  CROSSCLAMP TIME:   77'  CARDIOPULMONARY BYPASS TIME: 107'  FINDINGS:  Severe calcific aortic stenosis  Normal LV systolic function  Severe LVH with significant diastolic dysfunction   Aortic Valve  Procedure Performed:  Replacement: Yes.  Bioprosthetic Valve. Implant Model Number:[id number], Size:25, Unique Device Identifier:[id number]  Repair/Reconstruction: No.   Aortic Annular Enlargement: No.    Aortic Valve Etiology   Aortic Insufficiency:  Trivial/Trace  Aortic Valve Disease:  Yes.  Aortic Stenosis:  Yes. Smallest Aortic Valve Area: 0.96cm2; Highest Mean Gradient: 17mmHg.  Etiology (Choose at least one and up to  5 etiologies):  Degenerative - Calcified  COMPLICATIONS: None  BASELINE WEIGHT: 122 kg  PATIENT DISPOSITION:   TO SICU IN STABLE CONDITION  [name], MD 08/18/2015 1:54 PM

## 2015-08-18 NOTE — Progress Notes (Signed)
Patient ID: Brent Cobb, male   DOB: [birth year], 69 y.o.   MRN: [id number] EVENING ROUNDS NOTE :     [address]       [place] [address]             [phone]                 Day of Surgery Procedure(s) (LRB): AORTIC VALVE REPLACEMENT (AVR) (N/A) TRANSESOPHAGEAL ECHOCARDIOGRAM (TEE) (N/A)  Total Length of Stay:  LOS: 0 days  BP 79/59 mmHg  Pulse 79  Temp(Src) 99.1 F (37.3 C) (Oral)  Resp 18  Wt 269 lb (122.018 kg)  SpO2 97%  .Intake/Output      12/14 0701 - 12/15 0700   I.V. (mL/kg) 2572.6 (21.1)   Blood 1250   IV Piggyback 400   Total Intake(mL/kg) 4222.6 (34.6)   Urine (mL/kg/hr) 1500 (0.9)   Blood 1820 (1.1)   Chest Tube 50 (0)   Total Output 3370   Net +852.6         . sodium chloride 20 mL/hr at 08/18/15 1900  . [START ON 08/19/2015] sodium chloride    . sodium chloride 20 mL/hr at 08/18/15 1900  . sodium chloride 100 mL/hr at 08/18/15 1900  . dexmedetomidine Stopped (08/18/15 1720)  . insulin (NOVOLIN-R) infusion 1.7 Units/hr (08/18/15 1953)  . lactated ringers 10 mL/hr at 08/18/15 1900  . lactated ringers Stopped (08/18/15 1900)  . nitroGLYCERIN Stopped (08/18/15 1430)  . phenylephrine (NEO-SYNEPHRINE) Adult infusion Stopped (08/18/15 1802)     Lab Results  Component Value Date   WBC 22.1* 08/18/2015   HGB 13.6 08/18/2015   HCT 40.0 08/18/2015   PLT 185 08/18/2015   GLUCOSE 148* 08/18/2015   ALT 14* 08/16/2015   AST 19 08/16/2015   NA 139 08/18/2015   K 4.9 08/18/2015   CL 103 08/18/2015   CREATININE 1.00 08/18/2015   BUN 35* 08/18/2015   CO2 20* 08/16/2015   INR 1.38 08/18/2015   HGBA1C 7.9* 08/16/2015   Early post op, not bleeding weaning vent   Grace Isaac MD  Beeper [phone] Office [phone] 08/18/2015 8:35 PM

## 2015-08-18 NOTE — Progress Notes (Signed)
Pt still drowsy, off precedex. Tried to wean pt but he was unable to pass respiratory breathing trials. WIll continue to monitor closely.

## 2015-08-18 NOTE — Progress Notes (Signed)
Patient NIF -20. VC 1.0L. MD aware and gave ok to extubate patient. Patient extubated to [facility]. Patient tolerated procedure well with no apparent complications.

## 2015-08-18 NOTE — Progress Notes (Signed)
Pt. Failed NIF and VC, will try again in an hour. Placed back on full support.

## 2015-08-18 NOTE — Transfer of Care (Signed)
Immediate Anesthesia Transfer of Care Note  Patient: Mattheu Pointer  Procedure(s) Performed: Procedure(s): AORTIC VALVE REPLACEMENT (AVR) (N/A) TRANSESOPHAGEAL ECHOCARDIOGRAM (TEE) (N/A)  Patient Location: SICU  Anesthesia Type:General  Level of Consciousness: sedated and Patient remains intubated per anesthesia plan  Airway & Oxygen Therapy: Patient remains intubated per anesthesia plan and Patient placed on Ventilator (see vital sign flow sheet for setting)  Post-op Assessment: Report given to RN and Post -op Vital signs reviewed and stable  Post vital signs: Reviewed and stable  Last Vitals:  Filed Vitals:   08/18/15 0645  BP: 143/83  Pulse: 89  Temp: 36.1 C  Resp: 18    Complications: No apparent anesthesia complications

## 2015-08-18 NOTE — Anesthesia Postprocedure Evaluation (Addendum)
Anesthesia Post Note  Patient: Brent Cobb  Procedure(s) Performed: Procedure(s) (LRB): AORTIC VALVE REPLACEMENT (AVR) (N/A) TRANSESOPHAGEAL ECHOCARDIOGRAM (TEE) (N/A)  Patient location during evaluation: SICU Anesthesia Type: General Level of consciousness: sedated Pain management: pain level controlled Vital Signs Assessment: post-procedure vital signs reviewed and stable Respiratory status: patient remains intubated per anesthesia plan Cardiovascular status: stable Anesthetic complications: no Comments: Vital signs stable on minimal phenylephrine support. Post bypass echo with well seated AV with no perivalvular leak, mean gradient of 3-5 mmHg with no signs of AI or obstruction, LV EF was 60% in a pt with severe LVH of >1.4cm thickness. RV is baseline function, no sign of aortic dissection or pericardial effusion. ETT is in trachea appropriate depth from carina, PAC is in correct location, main PA vs. proximal right mainstem PA.   Veatrice Kells, MD    Last Vitals:  Filed Vitals:   08/18/15 0645  BP: 143/83  Pulse: 89  Temp: 36.1 C  Resp: 18    Last Pain: There were no vitals filed for this visit.               Zenaida Deed

## 2015-08-18 NOTE — OR Nursing (Signed)
13:25 - 45 minute call to SICU charge nurse 

## 2015-08-18 NOTE — Op Note (Signed)
CARDIOTHORACIC SURGERY OPERATIVE NOTE  Date of Procedure:  08/18/2015  Preoperative Diagnosis: Severe Aortic Stenosis   Postoperative Diagnosis: Same   Procedure:    Aortic Valve Replacement  Edwards Intuity Elite Rapid-deployment Pericardial Tissue Valve (size 25 mm, model # 9242AS, serial # [id number])   Surgeon: Valentina Gu. Roxy Manns, MD  Assistant: John Giovanni, PA-C  Anesthesia: Jillyn Hidden, MD  Operative Findings:  Severe calcific aortic stenosis  Normal LV systolic function  Severe LVH with significant diastolic dysfunction  Dense adhesions throughout the pericardial space, presumably from the patient's previous radiation therapy.          BRIEF CLINICAL NOTE AND INDICATIONS FOR SURGERY  Patient is a 69 year old obese white male with history of obesity, type 2 diabetes mellitus, obstructive sleep apnea, and severe degenerative arthritis of the left knee who has recently been discovered to have severe aortic stenosis and has been referred for surgical consultation to discuss treatment options. The patient describes a long-standing history of symptoms of exertional shortness of breath and intermittent chest tightness that has gradually progressed over the past 2 years. He has been struggling with chronic pain in his left knee related to degenerative arthritis that has severely limited his physical mobility over the past 3 years or more. He was recently evaluated by an orthopedic surgeon and plans were in place to proceed with knee replacement. However, the patient was noted to have a systolic murmur on physical exam and referred for cardiology clearance. The patient was evaluated by Dr. Harl Bowie and underwent a transthoracic echocardiogram on 07/07/2015. Peak velocity across the aortic valve measured 3.8 m/s corresponding to mean transvalvular gradient estimated 42 mmHg and aortic valve area calculated 0.96 cm. Left ventricular systolic function remains preserved with ejection  fraction estimated 60-65%. There was severe left ventricular hypertrophy with significant diastolic dysfunction. The patient subsequently underwent left and right heart catheterization by Dr. Burt Knack on 07/21/2015. The patient was found to have moderate nonobstructive coronary artery disease. The patient was referred for surgical consultation. The patient has been seen in consultation and counseled at length regarding the indications, risks and potential benefits of surgery.  All questions have been answered, and the patient provides full informed consent for the operation as described.    DETAILS OF THE OPERATIVE PROCEDURE  Preparation:  The patient is brought to the operating room on the above mentioned date and central monitoring was established by the anesthesia team including placement of Swan-Ganz catheter and radial arterial line. The patient is placed in the supine position on the operating table.  Intravenous antibiotics are administered. General endotracheal anesthesia is induced uneventfully. A Foley catheter is placed.  Baseline transesophageal echocardiogram was performed.  Findings were notable for severe aortic stenosis.  There was normal LV systolic function with severe LV hypertrophy.  There was trace aortic insufficiency and trace mitral regurgitation.  The patient's chest, abdomen, both groins, and both lower extremities are prepared and draped in a sterile manner. A time out procedure is performed.   Surgical Approach:  A median sternotomy incision was performed and the pericardium is opened. There were dense adhesions throughout the pericardial space, presumably from the patient;s previous radiation therapy.  Sharpe dissection is utilized to expose the ascending thoracic aorta which is normal in appearance.    Extracorporeal Cardiopulmonary Bypass and Myocardial Protection:  The right common femoral vein is cannulated using the Seldinger technique and a guidewire advanced  under TEE guidance through the right atrium into the superior vena cava.  The patient is heparinized systemically.  The right common femoral vein is cannulated with a 22 x 25 French long femoral venous cannula which is advanced under TEE guidance until the distal tip is in the superior vena cava.  The ascending aorta is cannulated for cardiopulmonary bypass.  Adequate heparinization is verified.     The entire pre-bypass portion of the operation was notable for stable hemodynamics.  Cardiopulmonary bypass was begun.  Vacuum assist venous drainage is utilized.  Sharpe dissection and electrocautery are utilized to free up the surface of the right atrium, the interatrial groove, and the aortic root.  A retrograde cardioplegia cannula is placed through the right atrium into the coronary sinus.  The operative field was continuously flooded with carbon dioxide gas.  A cardioplegia cannula is placed in the ascending aorta.    The patient is allowed to cool passively to 34C systemic temperature.  The aortic cross clamp is applied and cold blood cardioplegia is delivered initially in an antegrade fashion through the aortic root.   Supplemental cardioplegia is given retrograde through the coronary sinus catheter.  Iced saline slush is applied for topical hypothermia.  The initial cardioplegic arrest is rapid with early diastolic arrest.  Repeat doses of cardioplegia are administered intermittently throughout the entire cross clamp portion of the operation through the coronary sinus catheter in order to maintain completely flat electrocardiogram and septal myocardial temperature below 15C.  Myocardial protection was felt to be excellent.   Aortic Valve Replacement:  An oblique transverse aortotomy incision was performed and extended into the non-coronary sinus of Valsava.  The aortic valve was inspected and notable for severe aortic stenosis.  The aortic valve leaflets were excised sharply and the aortic annulus  decalcified.  Decalcification was notably straightforward.  The aortic annulus was sized to accept a 25 mm prosthesis.  The aortic root and left ventricle were irrigated with copious cold saline solution.  Aortic valve replacement was performed using an [employer] rapid deployment pericardial tissue valve (size 25 mm, model #[id number], serial # [id number]).  The valve was rinsed in saline per manufacture recommendations. A total of 3 individual guiding sutures are placed through the aortic annulus, each at the corresponding nadir of each sinus of Valsalva.  The valve was attached to the delivery device and the 3 guiding sutures placed through the valve sewing cuff. The valve is lowered into place. Care is taken to insure that the valve is completely seated in the annulus. The valve stent is deployed by inflating the deployment balloon to 5.0 atm pressure and holding for 10 seconds. The balloon is deflated the valve holder sutures cut In the deployment system removed. Each of the 3 guide sutures are secured using Cor knot clips.  The valve is carefully inspected to make sure that it is seated appropriately. Visualization through the valve is utilized to inspect the left ventricular outflow tract. Aortic root was filled with saline to make sure the valve is competent. Rewarming is begun.   Procedure Completion:  The aortotomy was closed using a 2-layer closure of running 4-0 Prolene suture.  One final dose of warm retrograde "hot shot" cardioplegia was administered retrograde through the coronary sinus catheter while all air was evacuated through the aortic root.  The aortic cross clamp was removed after a total cross clamp time of 67 minutes.  Epicardial pacing wires are fixed to the right ventricular outflow tract and to the right atrial appendage. The patient is rewarmed to 37C temperature.  The aortic and left ventricular vents are removed.  The patient is weaned and disconnected from cardiopulmonary  bypass.  The patient's rhythm at separation from bypass was sinus.  The patient was weaned from cardioplegic bypass without any inotropic support. Total cardiopulmonary bypass time for the operation was 107 minutes.  Followup transesophageal echocardiogram performed after separation from bypass revealed a well-seated aortic valve prosthesis that was functioning normally and without any sign of perivalvular leak.  Left ventricular function was unchanged from preoperatively.  Peak velocity across the aortic valve was measured 1.7 m/sec corresponding to a mean transvalvular gradient of 5 mmHg.  The aortic and venous cannula were removed uneventfully. Protamine was administered to reverse the anticoagulation. The mediastinum and pleural space were inspected for hemostasis and irrigated with saline solution. The mediastinum was drained using 2 chest tubes placed through separate stab incisions inferiorly.  The soft tissues anterior to the aorta were reapproximated loosely. The sternum is closed with double strength sternal wire. The soft tissues anterior to the sternum were closed in multiple layers and the skin is closed with a running subcuticular skin closure.  The post-bypass portion of the operation was notable for stable rhythm and hemodynamics.  No blood products were administered during the operation.   Disposition:  The patient tolerated the procedure well and is transported to the surgical intensive care in stable condition. There are no intraoperative complications. All sponge instrument and needle counts are verified correct at completion of the operation.    Valentina Gu. Roxy Manns MD 08/18/2015 1:58 PM

## 2015-08-18 NOTE — Anesthesia Procedure Notes (Addendum)
Procedure Name: Intubation Date/Time: 08/18/2015 9:08 AM Performed by: Layla Maw Pre-anesthesia Checklist: Patient identified, Patient being monitored, Timeout performed, Emergency Drugs available and Suction available Patient Re-evaluated:Patient Re-evaluated prior to inductionOxygen Delivery Method: Circle System Utilized Preoxygenation: Pre-oxygenation with 100% oxygen Intubation Type: IV induction Ventilation: Mask ventilation without difficulty and Oral airway inserted - appropriate to patient size Laryngoscope Size: Miller and 3 Grade View: Grade I Tube type: Subglottic suction tube Tube size: 8.0 mm Number of attempts: 1 Airway Equipment and Method: Stylet Placement Confirmation: ETT inserted through vocal cords under direct vision,  positive ETCO2 and breath sounds checked- equal and bilateral Secured at: 23 cm Tube secured with: Tape Dental Injury: Teeth and Oropharynx as per pre-operative assessment     Procedures: Right IJ Gordy Councilman Catheter Insertion: [phone]: The patient was identified and consent obtained.  TO was performed, and full barrier precautions were used.  The skin was anesthetized with lidocaine-4cc plain with 25g needle.  Once the vein was located with the 22 ga. needle using ultrasound guidance , the wire was inserted into the vein.  The wire location was confirmed with ultrasound.  The tissue was dilated and the 8.5 [place] cordis catheter was carefully inserted. Afterwards Gordy Councilman catheter was inserted. PA catheter at 78m.  The patient tolerated the procedure well.   CE

## 2015-08-18 NOTE — Progress Notes (Signed)
Attempted to wean again. Blood gas borderline and pt failed NIF and VC. Will attempt again after a period of rest. Will continue to monitor closely.  Eleonore Chiquito RN 2 [place]

## 2015-08-18 NOTE — Progress Notes (Signed)
Dr. Servando Snare called and informed on 3rd wean attempt pt blood gas was within parameters of rapid wean protocol  Ph 7.34 pc02 44.2 p02 90  hco3 24 Nif was -20 but VC was 1L, which is short of the 1.2 it should be per the protocol of 10/cc/kg.  Dr. Servando Snare instructed OK to extubated. Will continue to monitor closely. Eleonore Chiquito RN 2 [place]

## 2015-08-18 NOTE — Progress Notes (Signed)
  Echocardiogram Echocardiogram Transesophageal has been performed.  Brent Cobb 08/18/2015, 11:01 AM

## 2015-08-18 NOTE — Interval H&P Note (Signed)
History and Physical Interval Note:  08/18/2015 6:49 AM  Brent Cobb  has presented today for surgery, with the diagnosis of AS  The various methods of treatment have been discussed with the patient and family. After consideration of risks, benefits and other options for treatment, the patient has consented to  Procedure(s): AORTIC VALVE REPLACEMENT (AVR) (N/A) TRANSESOPHAGEAL ECHOCARDIOGRAM (TEE) (N/A) as a surgical intervention .  The patient's history has been reviewed, patient examined, no change in status, stable for surgery.  I have reviewed the patient's chart and labs.  Questions were answered to the patient's satisfaction.     Brent Cobb

## 2015-08-19 ENCOUNTER — Encounter (HOSPITAL_COMMUNITY): Payer: Self-pay | Admitting: Thoracic Surgery (Cardiothoracic Vascular Surgery)

## 2015-08-19 ENCOUNTER — Inpatient Hospital Stay (HOSPITAL_COMMUNITY): Payer: Medicare Other

## 2015-08-19 LAB — CBC
HEMATOCRIT: 36 % — AB (ref 39.0–52.0)
HEMATOCRIT: 36.4 % — AB (ref 39.0–52.0)
HEMOGLOBIN: 11.5 g/dL — AB (ref 13.0–17.0)
Hemoglobin: 11.2 g/dL — ABNORMAL LOW (ref 13.0–17.0)
MCH: 28.1 pg (ref 26.0–34.0)
MCH: 28.5 pg (ref 26.0–34.0)
MCHC: 31.1 g/dL (ref 30.0–36.0)
MCHC: 31.6 g/dL (ref 30.0–36.0)
MCV: 90.2 fL (ref 78.0–100.0)
MCV: 90.3 fL (ref 78.0–100.0)
PLATELETS: 149 10*3/uL — AB (ref 150–400)
Platelets: 150 10*3/uL (ref 150–400)
RBC: 3.99 MIL/uL — AB (ref 4.22–5.81)
RBC: 4.03 MIL/uL — ABNORMAL LOW (ref 4.22–5.81)
RDW: 14.5 % (ref 11.5–15.5)
RDW: 14.5 % (ref 11.5–15.5)
WBC: 12.1 10*3/uL — ABNORMAL HIGH (ref 4.0–10.5)
WBC: 13.7 10*3/uL — AB (ref 4.0–10.5)

## 2015-08-19 LAB — GLUCOSE, CAPILLARY
GLUCOSE-CAPILLARY: 109 mg/dL — AB (ref 65–99)
GLUCOSE-CAPILLARY: 113 mg/dL — AB (ref 65–99)
GLUCOSE-CAPILLARY: 116 mg/dL — AB (ref 65–99)
GLUCOSE-CAPILLARY: 129 mg/dL — AB (ref 65–99)
GLUCOSE-CAPILLARY: 139 mg/dL — AB (ref 65–99)
GLUCOSE-CAPILLARY: 173 mg/dL — AB (ref 65–99)
GLUCOSE-CAPILLARY: 197 mg/dL — AB (ref 65–99)
GLUCOSE-CAPILLARY: 219 mg/dL — AB (ref 65–99)
GLUCOSE-CAPILLARY: 94 mg/dL (ref 65–99)
Glucose-Capillary: 109 mg/dL — ABNORMAL HIGH (ref 65–99)
Glucose-Capillary: 111 mg/dL — ABNORMAL HIGH (ref 65–99)
Glucose-Capillary: 116 mg/dL — ABNORMAL HIGH (ref 65–99)
Glucose-Capillary: 118 mg/dL — ABNORMAL HIGH (ref 65–99)
Glucose-Capillary: 121 mg/dL — ABNORMAL HIGH (ref 65–99)
Glucose-Capillary: 124 mg/dL — ABNORMAL HIGH (ref 65–99)
Glucose-Capillary: 132 mg/dL — ABNORMAL HIGH (ref 65–99)
Glucose-Capillary: 139 mg/dL — ABNORMAL HIGH (ref 65–99)
Glucose-Capillary: 142 mg/dL — ABNORMAL HIGH (ref 65–99)
Glucose-Capillary: 149 mg/dL — ABNORMAL HIGH (ref 65–99)
Glucose-Capillary: 163 mg/dL — ABNORMAL HIGH (ref 65–99)
Glucose-Capillary: 232 mg/dL — ABNORMAL HIGH (ref 65–99)
Glucose-Capillary: 241 mg/dL — ABNORMAL HIGH (ref 65–99)
Glucose-Capillary: 259 mg/dL — ABNORMAL HIGH (ref 65–99)

## 2015-08-19 LAB — MAGNESIUM
MAGNESIUM: 2.3 mg/dL (ref 1.7–2.4)
Magnesium: 2.4 mg/dL (ref 1.7–2.4)

## 2015-08-19 LAB — POCT I-STAT 3, ART BLOOD GAS (G3+)
ACID-BASE DEFICIT: 3 mmol/L — AB (ref 0.0–2.0)
Acid-base deficit: 3 mmol/L — ABNORMAL HIGH (ref 0.0–2.0)
BICARBONATE: 22.5 meq/L (ref 20.0–24.0)
BICARBONATE: 23.5 meq/L (ref 20.0–24.0)
O2 SAT: 94 %
O2 SAT: 97 %
PO2 ART: 80 mmHg (ref 80.0–100.0)
TCO2: 24 mmol/L (ref 0–100)
TCO2: 25 mmol/L (ref 0–100)
pCO2 arterial: 42.4 mmHg (ref 35.0–45.0)
pCO2 arterial: 46.4 mmHg — ABNORMAL HIGH (ref 35.0–45.0)
pH, Arterial: 7.314 — ABNORMAL LOW (ref 7.350–7.450)
pH, Arterial: 7.335 — ABNORMAL LOW (ref 7.350–7.450)
pO2, Arterial: 94 mmHg (ref 80.0–100.0)

## 2015-08-19 LAB — POCT I-STAT, CHEM 8
BUN: 20 mg/dL (ref 6–20)
Calcium, Ion: 1.15 mmol/L (ref 1.13–1.30)
Chloride: 104 mmol/L (ref 101–111)
Creatinine, Ser: 1.1 mg/dL (ref 0.61–1.24)
GLUCOSE: 180 mg/dL — AB (ref 65–99)
HEMATOCRIT: 35 % — AB (ref 39.0–52.0)
Hemoglobin: 11.9 g/dL — ABNORMAL LOW (ref 13.0–17.0)
POTASSIUM: 4.5 mmol/L (ref 3.5–5.1)
SODIUM: 140 mmol/L (ref 135–145)
TCO2: 25 mmol/L (ref 0–100)

## 2015-08-19 LAB — CREATININE, SERUM: Creatinine, Ser: 1.07 mg/dL (ref 0.61–1.24)

## 2015-08-19 LAB — BASIC METABOLIC PANEL
Anion gap: 4 — ABNORMAL LOW (ref 5–15)
BUN: 20 mg/dL (ref 6–20)
CO2: 25 mmol/L (ref 22–32)
Calcium: 7.8 mg/dL — ABNORMAL LOW (ref 8.9–10.3)
Chloride: 111 mmol/L (ref 101–111)
Creatinine, Ser: 1.02 mg/dL (ref 0.61–1.24)
GFR calc Af Amer: >60 mL/min (ref >60)
GLUCOSE: 132 mg/dL — AB (ref 65–99)
POTASSIUM: 4.4 mmol/L (ref 3.5–5.1)
Sodium: 140 mmol/L (ref 135–145)

## 2015-08-19 MED ORDER — SODIUM CHLORIDE 0.9 % IV SOLN: 250.0000 mL | INTRAVENOUS | Status: DC | PRN

## 2015-08-19 MED ORDER — SODIUM CHLORIDE 0.9 % IJ SOLN
3.0000 mL | Freq: Two times a day (BID) | INTRAMUSCULAR | Status: DC
  Administered 2015-08-19 – 2015-08-22 (×5): 3 mL via INTRAVENOUS

## 2015-08-19 MED ORDER — MOVING RIGHT ALONG BOOK
Freq: Once | Status: AC
  Administered 2015-08-19: 08:00:00
  Filled 2015-08-19: qty 1

## 2015-08-19 MED ORDER — SODIUM CHLORIDE 0.9 % IJ SOLN: 3.0000 mL | INTRAMUSCULAR | Status: DC | PRN

## 2015-08-19 MED ORDER — CETYLPYRIDINIUM CHLORIDE 0.05 % MT LIQD
7.0000 mL | Freq: Two times a day (BID) | OROMUCOSAL | Status: DC
  Administered 2015-08-19 – 2015-08-22 (×5): 7 mL via OROMUCOSAL

## 2015-08-19 MED ORDER — FUROSEMIDE 40 MG PO TABS
40.0000 mg | ORAL_TABLET | Freq: Every day | ORAL | Status: AC
  Administered 2015-08-20 – 2015-08-22 (×3): 40 mg via ORAL
  Filled 2015-08-19 (×3): qty 1

## 2015-08-19 MED ORDER — POTASSIUM CHLORIDE CRYS ER 20 MEQ PO TBCR
20.0000 meq | EXTENDED_RELEASE_TABLET | Freq: Every day | ORAL | Status: AC
  Administered 2015-08-20 – 2015-08-22 (×3): 20 meq via ORAL
  Filled 2015-08-19 (×3): qty 1

## 2015-08-19 MED ORDER — SODIUM CHLORIDE 0.9 % IV SOLN
INTRAVENOUS | Status: DC | PRN
  Administered 2015-08-19: 3.6 [IU]/h via INTRAVENOUS
  Filled 2015-08-19: qty 2.5

## 2015-08-19 MED ORDER — INSULIN REGULAR BOLUS VIA INFUSION
0.0000 [IU] | Freq: Three times a day (TID) | INTRAVENOUS | Status: DC | PRN
  Filled 2015-08-19: qty 10

## 2015-08-19 MED ORDER — FUROSEMIDE 10 MG/ML IJ SOLN
20.0000 mg | Freq: Once | INTRAMUSCULAR | Status: AC
  Administered 2015-08-19: 20 mg via INTRAVENOUS
  Filled 2015-08-19: qty 2

## 2015-08-19 MED ORDER — INSULIN DETEMIR 100 UNIT/ML ~~LOC~~ SOLN
20.0000 [IU] | Freq: Two times a day (BID) | SUBCUTANEOUS | Status: DC
  Administered 2015-08-19 (×2): 20 [IU] via SUBCUTANEOUS
  Filled 2015-08-19 (×4): qty 0.2

## 2015-08-19 MED ORDER — METOPROLOL TARTRATE 25 MG PO TABS
25.0000 mg | ORAL_TABLET | Freq: Two times a day (BID) | ORAL | Status: DC
  Administered 2015-08-19: 25 mg via ORAL
  Filled 2015-08-19: qty 1

## 2015-08-19 MED ORDER — MORPHINE SULFATE (PF) 2 MG/ML IV SOLN
2.0000 mg | INTRAVENOUS | Status: DC | PRN
  Administered 2015-08-19 (×2): 2 mg via INTRAVENOUS
  Filled 2015-08-19 (×2): qty 1

## 2015-08-19 MED ORDER — INSULIN ASPART 100 UNIT/ML ~~LOC~~ SOLN
0.0000 [IU] | SUBCUTANEOUS | Status: DC
  Administered 2015-08-19: 8 [IU] via SUBCUTANEOUS
  Administered 2015-08-19: 2 [IU] via SUBCUTANEOUS

## 2015-08-19 NOTE — Plan of Care (Signed)
Problem: Bowel/Gastric: Goal: Gastrointestinal status for postoperative course will improve Outcome: Progressing Pt tolerating ice chips and sips of water.  Problem: Cardiac: Goal: Hemodynamic stability will improve Outcome: Progressing Pt currently requiring no drips. Goal: Ability to maintain an adequate cardiac output will improve Outcome: Progressing CI greater than 1.8  Problem: Respiratory: Goal: Levels of oxygenation will improve Outcome: Progressing Pt extubated and on 6L nasal cannula.  Goal: Ability to tolerate decreased levels of ventilator support will improve Outcome: Completed/Met Date Met:  08/19/15 Extubated 08/18/2015     

## 2015-08-19 NOTE — Progress Notes (Signed)
[facility] BRIEF SICU PROGRESS NOTE  1 Day Post-Op  S/P Procedure(s) (LRB): AORTIC VALVE REPLACEMENT (AVR) (N/A) TRANSESOPHAGEAL ECHOCARDIOGRAM (TEE) (N/A)   Doing well Sitting up eating supper NSR-sinus tach w/ stable BP O2 sats 97% on 2 L/min UOP adequate  Plan: Will increase beta blocker.  Continue routine care.  [name], MD 08/19/2015 7:27 PM

## 2015-08-19 NOTE — Progress Notes (Signed)
[address]       [place] [address]             [phone]        CARDIOTHORACIC SURGERY PROGRESS NOTE   R1 Day Post-Op Procedure(s) (LRB): AORTIC VALVE REPLACEMENT (AVR) (N/A) TRANSESOPHAGEAL ECHOCARDIOGRAM (TEE) (N/A)  Subjective: Looks good.  Mild soreness in chest.  No SOB.  No nausea.  Objective: Vital signs: BP Readings from Last 1 Encounters:  08/19/15 107/66   Pulse Readings from Last 1 Encounters:  08/19/15 102   Resp Readings from Last 1 Encounters:  08/19/15 17   Temp Readings from Last 1 Encounters:  08/19/15 99.7 F (37.6 C)     Hemodynamics: PAP: (19-35)/(8-25) 30/18 mmHg CO:  [4 L/min-8 L/min] 8 L/min CI:  [1.7 L/min/m2-3.4 L/min/m2] 3.4 L/min/m2  Physical Exam:  Rhythm:   sinus  Breath sounds: clear  Heart sounds:  RRR  Incisions:  Dressing dry, intact  Abdomen:  Soft, non-distended, non-tender  Extremities:  Warm, well-perfused  Chest tubes:  Low volume thin serosanguinous output, no air leak    Intake/Output from previous day: 12/14 0701 - 12/15 0700 In: 5520.3 [I.V.:3670.3; Blood:1250; IV Piggyback:600] Out: [id number] [Urine:2525; Blood:1820; Chest Tube:180] Intake/Output this shift:    Lab Results:  CBC: Recent Labs  08/18/15 2037 08/19/15 0401  WBC 13.7* 12.1*  HGB 11.7* 11.2*  HCT 36.8* 36.0*  PLT 153 149*    BMET:  Recent Labs  08/16/15 0944  08/18/15 2028 08/18/15 2037 08/19/15 0401  NA 138  < > 142  --  140  K 4.8  < > 4.2  --  4.4  CL 107  < > 108  --  111  CO2 20*  --   --   --  25  GLUCOSE 193*  < > 121*  --  132*  BUN 23*  < > 23*  --  20  CREATININE 1.11  < > 0.90 1.04 1.02  CALCIUM 9.2  --   --   --  7.8*  < > = values in this interval not displayed.   PT/INR:   Recent Labs  08/18/15 1436  LABPROT 17.1*  INR 1.38    CBG (last 3)   Recent Labs  08/19/15 0204 08/19/15 0309 08/19/15 0400  GLUCAP 109* 116* 124*    ABG    Component Value Date/Time   PHART 7.335*  08/19/2015 0530   PCO2ART 42.4 08/19/2015 0530   PO2ART 94.0 08/19/2015 0530   HCO3 22.5 08/19/2015 0530   TCO2 24 08/19/2015 0530   ACIDBASEDEF 3.0* 08/19/2015 0530   O2SAT 97.0 08/19/2015 0530    CXR:  PORTABLE CHEST 1 VIEW  COMPARISON: 08/18/2015. 08/16/2015 .  FINDINGS: Interim extubation and removal of NG tube. Swan-Ganz catheter and mediastinal drainage catheter in stable position. Prior median sternotomy and cardiac valve replacement. Stable cardiomegaly. Mild interstitial prominence and small bilateral pleural effusions. Mild congestive heart failure cannot be excluded. Low lung volumes with mild basilar atelectasis. Tiny apical pneumothorax noted bilaterally.  IMPRESSION: 1. Interim extubation and removal of NG tube. Swan-Ganz catheter mediastinal drainage catheter stable position. 2. Tiny apical pneumothorax noted bilaterally on today's exam. 3. Prior cardiac valve replacement. Cardiomegaly with mild pulmonary interstitial prominence and small pleural effusions suggesting mild congestive heart failure. Low lung volumes. Critical Value/emergent results were called by telephone at the time of interpretation on 08/19/2015 at 7:22 am to [place], who verbally acknowledged these results.   Electronically  Signed  By: Marcello Moores Register  On: 08/19/2015 07:26        Assessment/Plan: S/P Procedure(s) (LRB): AORTIC VALVE REPLACEMENT (AVR) (N/A) TRANSESOPHAGEAL ECHOCARDIOGRAM (TEE) (N/A)  Doing very well POD1 Maintaining NSR w/ stable hemodynamics, no drips O2 sats 98-100% on 6 L/min via [place] Expected post op acute blood loss anemia, mild Chronic diastolic CHF with expected post-op volume excess, mild Expected post op atelectasis, mild Post op apical pneumothorax, bilateral, tiny Type II diabetes mellitus, excellent glycemic control on insulin drip   Mobilize  D/C tubes and lines  Pulm toilet  Add levemir and wean insulin drip off  Transfer  step down later today if stable   [name], MD 08/19/2015 7:51 AM

## 2015-08-19 NOTE — Care Management Note (Signed)
Case Management Note  Patient Details  Name: Brent Cobb MRN: [id number] Date of Birth: 1946/04/22  Subjective/Objective:     Post op AVR on 12-14.  Lives at home with wife, supportive family plans for care 24/7 on discharge.                Action/Plan:   Expected Discharge Date:                  Expected Discharge Plan:  Home/Self Care  In-House Referral:     Discharge planning Services  CM Consult  Post Acute Care Choice:    Choice offered to:     DME Arranged:    DME Agency:     HH Arranged:    HH Agency:     Status of Service:  In process, will continue to follow  Medicare Important Message Given:    Date Medicare IM Given:    Medicare IM give by:    Date Additional Medicare IM Given:    Additional Medicare Important Message give by:     If discussed at [place] of Stay Meetings, dates discussed:    Additional Comments:  Vergie Living, RN 08/19/2015, 2:44 PM

## 2015-08-20 ENCOUNTER — Inpatient Hospital Stay (HOSPITAL_COMMUNITY): Payer: Medicare Other

## 2015-08-20 LAB — GLUCOSE, CAPILLARY
GLUCOSE-CAPILLARY: 203 mg/dL — AB (ref 65–99)
GLUCOSE-CAPILLARY: 144 mg/dL — AB (ref 65–99)
GLUCOSE-CAPILLARY: 93 mg/dL (ref 65–99)
GLUCOSE-CAPILLARY: 81 mg/dL (ref 65–99)
GLUCOSE-CAPILLARY: 179 mg/dL — AB (ref 65–99)
GLUCOSE-CAPILLARY: 231 mg/dL — AB (ref 65–99)
Glucose-Capillary: 109 mg/dL — ABNORMAL HIGH (ref 65–99)
Glucose-Capillary: 257 mg/dL — ABNORMAL HIGH (ref 65–99)
Glucose-Capillary: 296 mg/dL — ABNORMAL HIGH (ref 65–99)

## 2015-08-20 LAB — CBC
HEMATOCRIT: 34.2 % — AB (ref 39.0–52.0)
Hemoglobin: 11.2 g/dL — ABNORMAL LOW (ref 13.0–17.0)
MCH: 29.8 pg (ref 26.0–34.0)
MCHC: 32.7 g/dL (ref 30.0–36.0)
MCV: 91 fL (ref 78.0–100.0)
Platelets: 147 10*3/uL — ABNORMAL LOW (ref 150–400)
RBC: 3.76 MIL/uL — ABNORMAL LOW (ref 4.22–5.81)
RDW: 14.5 % (ref 11.5–15.5)
WBC: 12.3 10*3/uL — ABNORMAL HIGH (ref 4.0–10.5)

## 2015-08-20 LAB — BASIC METABOLIC PANEL
Anion gap: 6 (ref 5–15)
BUN: 18 mg/dL (ref 6–20)
CHLORIDE: 104 mmol/L (ref 101–111)
CO2: 28 mmol/L (ref 22–32)
Calcium: 8.1 mg/dL — ABNORMAL LOW (ref 8.9–10.3)
Creatinine, Ser: 1.06 mg/dL (ref 0.61–1.24)
GFR calc Af Amer: >60 mL/min (ref >60)
GLUCOSE: 107 mg/dL — AB (ref 65–99)
POTASSIUM: 4.5 mmol/L (ref 3.5–5.1)
Sodium: 138 mmol/L (ref 135–145)

## 2015-08-20 MED ORDER — INSULIN ASPART 100 UNIT/ML ~~LOC~~ SOLN
0.0000 [IU] | SUBCUTANEOUS | Status: DC
  Administered 2015-08-20: 4 [IU] via SUBCUTANEOUS

## 2015-08-20 MED ORDER — INSULIN DETEMIR 100 UNIT/ML ~~LOC~~ SOLN
28.0000 [IU] | Freq: Two times a day (BID) | SUBCUTANEOUS | Status: DC
  Administered 2015-08-20 (×2): 28 [IU] via SUBCUTANEOUS
  Filled 2015-08-20 (×3): qty 0.28

## 2015-08-20 MED ORDER — LIRAGLUTIDE 18 MG/3ML ~~LOC~~ SOPN
1.2000 mL | PEN_INJECTOR | Freq: Every day | SUBCUTANEOUS | Status: DC
  Administered 2015-08-20: 7.2 mg via SUBCUTANEOUS

## 2015-08-20 MED ORDER — LOSARTAN POTASSIUM 25 MG PO TABS
25.0000 mg | ORAL_TABLET | Freq: Every day | ORAL | Status: DC
  Administered 2015-08-21 – 2015-08-23 (×3): 25 mg via ORAL
  Filled 2015-08-20 (×3): qty 1

## 2015-08-20 MED ORDER — INSULIN ASPART 100 UNIT/ML ~~LOC~~ SOLN
0.0000 [IU] | Freq: Three times a day (TID) | SUBCUTANEOUS | Status: DC
  Administered 2015-08-20: 7 [IU] via SUBCUTANEOUS
  Administered 2015-08-20: 11 [IU] via SUBCUTANEOUS
  Administered 2015-08-21 (×2): 4 [IU] via SUBCUTANEOUS
  Administered 2015-08-22 – 2015-08-23 (×3): 7 [IU] via SUBCUTANEOUS

## 2015-08-20 MED ORDER — ASPIRIN EC 325 MG PO TBEC
325.0000 mg | DELAYED_RELEASE_TABLET | Freq: Every day | ORAL | Status: DC
  Administered 2015-08-20 – 2015-08-23 (×4): 325 mg via ORAL
  Filled 2015-08-20 (×4): qty 1

## 2015-08-20 MED ORDER — INSULIN ASPART 100 UNIT/ML ~~LOC~~ SOLN
0.0000 [IU] | Freq: Every day | SUBCUTANEOUS | Status: DC
  Administered 2015-08-20 – 2015-08-22 (×2): 3 [IU] via SUBCUTANEOUS

## 2015-08-20 MED ORDER — METOPROLOL TARTRATE 25 MG PO TABS
25.0000 mg | ORAL_TABLET | Freq: Two times a day (BID) | ORAL | Status: DC
  Administered 2015-08-20 – 2015-08-22 (×5): 25 mg via ORAL
  Filled 2015-08-20 (×5): qty 1

## 2015-08-20 MED ORDER — ENOXAPARIN SODIUM 30 MG/0.3ML ~~LOC~~ SOLN
30.0000 mg | SUBCUTANEOUS | Status: DC
  Administered 2015-08-20 – 2015-08-23 (×4): 30 mg via SUBCUTANEOUS
  Filled 2015-08-20 (×4): qty 0.3

## 2015-08-20 MED FILL — Heparin Sodium (Porcine) Inj 1000 Unit/ML: INTRAMUSCULAR | Qty: 2500 | Status: AC

## 2015-08-20 NOTE — Discharge Instructions (Signed)
Aortic Valve Replacement, Care After °Refer to this sheet in the next few weeks. These instructions provide you with information on caring for yourself after your procedure. Your health care provider may also give you specific instructions. Your treatment has been planned according to current medical practices, but problems sometimes occur. Call your health care provider if you have any problems or questions after your procedure. °HOME CARE INSTRUCTIONS  °· Take medicines only as directed by your health care provider. °· If your health care provider has prescribed elastic stockings, wear them as directed. °· Take frequent naps or rest often throughout the day. °· Avoid lifting over 10 lbs (4.5 kg) or pushing or pulling things with your arms for 6-8 weeks or as directed by your health care provider. °· Avoid driving or airplane travel for 4-6 weeks after surgery or as directed by your health care provider. If you are riding in a car for an extended period, stop every 1-2 hours to stretch your legs. Keep a record of your medicines and medical history with you when traveling. °· Do not drive or operate heavy machinery while taking pain medicine. (narcotics). °· Do not cross your legs. °· Do not use any tobacco products including cigarettes, chewing tobacco, or electronic cigarettes. If you need help quitting, ask your health care provider. °· Do not take baths, swim, or use a hot tub until your health care provider approves. Take showers once your health care provider approves. Pat incisions dry. Do not rub incisions with a washcloth or towel. °· Avoid climbing stairs and using the handrail to pull yourself up for the first 2-3 weeks after surgery. °· Return to work as directed by your health care provider. °· Drink enough fluid to keep your urine clear or pale yellow. °· Do not strain to have a bowel movement. Eat high-fiber foods if you become constipated. You may also take a medicine to help you have a bowel  movement (laxative) as directed by your health care provider. °· Resume sexual activity as directed by your health care provider. Men should not use medicines for erectile dysfunction until their doctor says it is okay. °· If you had a certain type of heart condition in the past, you may need to take antibiotic medicine before having dental work or surgery. Let your dentist and health care providers know if you had one or more of the following: °¨ Previous endocarditis. °¨ An artificial (prosthetic) heart valve. °¨ Congenital heart disease. °SEEK MEDICAL CARE IF: °· You develop a skin rash.   °· You experience sudden changes in your weight. °· You have a fever. °SEEK IMMEDIATE MEDICAL CARE IF:  °· You develop chest pain that is not coming from your incision. °· You have drainage (pus), redness, swelling, or pain at your incision site.   °· You develop shortness of breath or have difficulty breathing.   °· You have increased bleeding from your incision site.   °· You develop light-headedness.   °MAKE SURE YOU:  °· Understand these directions. °· Will watch your condition. °· Will get help right away if you are not doing well or get worse. °  °This information is not intended to replace advice given to you by your health care provider. Make sure you discuss any questions you have with your health care provider. °  °Document Released: 03/09/2005 Document Revised: 09/11/2014 Document Reviewed: 06/04/2012 °[employer] Interactive Patient Education ©2016 [employer] ° °

## 2015-08-20 NOTE — Progress Notes (Signed)
[address]       [place] [address]             [phone]        CARDIOTHORACIC SURGERY PROGRESS NOTE   R2 Days Post-Op Procedure(s) (LRB): AORTIC VALVE REPLACEMENT (AVR) (N/A) TRANSESOPHAGEAL ECHOCARDIOGRAM (TEE) (N/A)  Subjective: Looks good.  Only complaint is painful, swollen right hand.  Hungry for breakfast  Objective: Vital signs: BP Readings from Last 1 Encounters:  08/20/15 124/60   Pulse Readings from Last 1 Encounters:  08/20/15 88   Resp Readings from Last 1 Encounters:  08/20/15 18   Temp Readings from Last 1 Encounters:  08/20/15 98.8 F (37.1 C) Oral    Hemodynamics: PAP: (33-34)/(20-22) 34/22 mmHg  Physical Exam:  Rhythm:   sinus  Breath sounds: clear  Heart sounds:  RRR  Incisions:  Dressing dry, intact  Abdomen:  Soft, non-distended, non-tender  Extremities:  Warm, well-perfused, right hand swollen - presumably from previous IV which has been removed - palpable radial pulse   Intake/Output from previous day: 12/15 0701 - 12/16 0700 In: 834.3 [P.O.:480; I.V.:204.3; IV Piggyback:150] Out: 2140 [Urine:2120; Chest Tube:20] Intake/Output this shift:    Lab Results:  CBC: Recent Labs  08/19/15 1545 08/19/15 1552 08/20/15 0319  WBC 13.7*  --  12.3*  HGB 11.5* 11.9* 11.2*  HCT 36.4* 35.0* 34.2*  PLT 150  --  147*    BMET:  Recent Labs  08/19/15 0401  08/19/15 1552 08/20/15 0319  NA 140  --  140 138  K 4.4  --  4.5 4.5  CL 111  --  104 104  CO2 25  --   --  28  GLUCOSE 132*  --  180* 107*  BUN 20  --  20 18  CREATININE 1.02  < > 1.10 1.06  CALCIUM 7.8*  --   --  8.1*  < > = values in this interval not displayed.   PT/INR:   Recent Labs  08/18/15 1436  LABPROT 17.1*  INR 1.38    CBG (last 3)   Recent Labs  08/20/15 0305 08/20/15 0356 08/20/15 0817  GLUCAP 93 81 179*    ABG    Component Value Date/Time   PHART 7.335* 08/19/2015 0530   PCO2ART 42.4 08/19/2015 0530   PO2ART 94.0  08/19/2015 0530   HCO3 22.5 08/19/2015 0530   TCO2 25 08/19/2015 1552   ACIDBASEDEF 3.0* 08/19/2015 0530   O2SAT 97.0 08/19/2015 0530    CXR: PORTABLE CHEST 1 VIEW  COMPARISON: Portable chest x-ray of August 19, 2015  FINDINGS: The tiny right apical pneumothorax is not clearly evident today. On the left the pneumothorax is stable. There is atelectasis at both lung bases. The cardiac silhouette remains enlarged. The pulmonary vascularity is less prominent today. The mediastinum remains widened.  IMPRESSION: Stable small left apical pneumothorax. The right apical pneumothorax visible on yesterday's study is not clearly evident today. Stable cardiomegaly mild central pulmonary vascular congestion and bibasilar atelectasis.   Electronically Signed  By: David Martinique M.D.  On: 08/20/2015 07:47   Assessment/Plan: S/P Procedure(s) (LRB): AORTIC VALVE REPLACEMENT (AVR) (N/A) TRANSESOPHAGEAL ECHOCARDIOGRAM (TEE) (N/A)  Doing very well POD2 Maintaining NSR w/ stable BP O2 sats 94-96% on 2 L/min via [place] Expected post op acute blood loss anemia, mild Chronic diastolic CHF with expected post-op volume excess, mild Expected post op atelectasis, mild Post op apical pneumothorax, bilateral, tiny Type II diabetes mellitus, CBG's up yesterday  evening - now back down Likely infiltration of IV in right hand   Mobilize  Pulm toilet  Keep right hand elevated and ice packs as needed  Increase levemir and restart Victoza, transition back to home meds as po intake improves  Continue ASA + beta blocker  Restart Cozaar at reduced dose and increase depending on BP  Lovenox for DVT prophylaxis  Transfer 2W  Anticipate possible d/c home 2-3 days  [name], MD 08/20/2015 8:48 AM

## 2015-08-20 NOTE — Progress Notes (Signed)
Pt arrived to unit by RN.  Telemetry connected and CCMD notified.  Education given on call light and telephone.  Pt with swelling to right arm and elevated on pillows.  Pt denies chest pain or sob.  Pt on room air and tolerating well.  Will cont to monitor.

## 2015-08-20 NOTE — Discharge Summary (Signed)
Physician Discharge Summary  Patient ID: Brent Cobb MRN: [id number] DOB/AGE: 02-19-1946 69 y.o.  Admit date: 08/18/2015 Discharge date: 08/22/2015  Admission Diagnoses:  Patient Active Problem List   Diagnosis Date Noted  . Type II diabetes mellitus ([place])   . Chronic diastolic congestive heart failure ([place])   . Obesity   . Degenerative arthritis of left knee   . Obstructive sleep apnea   . Varicose veins   . Severe aortic stenosis 07/21/2015   Discharge Diagnoses:   Patient Active Problem List   Diagnosis Date Noted  . S/P aortic valve replacement with bioprosthetic valve 08/18/2015  . Type II diabetes mellitus ([place])   . Chronic diastolic congestive heart failure ([place])   . Obesity   . Degenerative arthritis of left knee   . Obstructive sleep apnea   . Varicose veins   . Severe aortic stenosis 07/21/2015   Discharged Condition: good  History of Present Illness:  Brent Cobb is a 69 year old obese white male with history of obesity, type 2 diabetes mellitus, obstructive sleep apnea, and severe degenerative arthritis of the left knee who has recently been discovered to have severe aortic stenosis The patient describes a long-standing history of symptoms of exertional shortness of breath and intermittent chest tightness that has gradually progressed over the past 2 years. He has been struggling with chronic pain in his left knee related to degenerative arthritis that has severely limited his physical mobility over the past 3 years or more. He was recently evaluated by an orthopedic surgeon and plans were in place to proceed with knee replacement. However, the patient was noted to have a systolic murmur on physical exam and referred for cardiology clearance. The patient was evaluated by Dr. Harl Bowie and underwent a transthoracic echocardiogram on 07/07/2015. Peak velocity across the aortic valve measured 3.8 m/s corresponding to mean transvalvular gradient estimated 42 mmHg and aortic  valve area calculated 0.96 cm. Left ventricular systolic function remains preserved with ejection fraction estimated 60-65%. There was severe left ventricular hypertrophy with significant diastolic dysfunction. The patient subsequently underwent left and right heart catheterization by Dr. Burt Knack on 07/21/2015. The patient was found to have moderate nonobstructive coronary artery disease.  Due to this he was referred for surgical consultation at [facility].  He was evaluated by Dr. Roxy Manns on 08/09/2015 at which time he was in agreement the patient should undergo Aortic Valve Replacement.  The risks and benefits of the procedure were explained to the patient and his family and he was agreeable to proceed.  Hospital Course:   Brent Cobb presented to [facility] on 08/18/2015.  He was taken to the operating room and underwent Aortic Valve Replacement utilizing a 25 mm Edwards Intuity Elite Rapid Deployment Pericardial Tissue Valve.  He tolerated the procedure and was taken to the SICU in stable condition.  The patient was extubated the evening of surgery.  During his stay in the SICU the patient progressed well.  He was hemodynamically stable and did require inotropic support.  His chest tubes and arterial lines were removed without difficulty.  He developed some Sinus Tachycardia and his beta blocker was titrated accordingly.  He was also restarted on Cozaar for HTN.  He was ambulating with assistance and felt medically stable for transfer to the stepdown unit on POD #2.  The patient continued to make progress.  He was maintaining NSR and his pacing wires were removed without difficulty.  He complained of R hand swelling.  This was felt to possibly  be early cellulitis.  He was placed on Keflex and instructed to elevate his hand and apply ice at 20 min increments. He is on lasix for volume overload which will be continued as an outpatient.   He is ambulating without difficulty.  He is tolerating a carb modified diet.   He is felt medically stable for discharge today 08/23/2015.         Significant Diagnostic Studies: Echocardiogram:  - Left ventricle: The cavity size was normal. Wall thickness was increased in a pattern of severe LVH. Systolic function was normal. The estimated ejection fraction was in the range of 60% to 65%. Wall motion was normal; there were no regional wall motion abnormalities. Doppler parameters are consistent with abnormal left ventricular relaxation (grade 1 diastolic dysfunction). - Aortic valve: Severely calcified annulus. Trileaflet; severely thickened leaflets. There was severe stenosis. The contour of the spectral Doppler waveform may lead to underestimation of severity. Valve area (VTI): 1.01 cm^2. Valve area (Vmax): 0.96 cm^2. Valve area (Vmean): 0.79 cm^2. - Mitral valve: Mildly calcified annulus. Mildly thickened leaflets . - Left atrium: The atrium was mildly dilated.  Treatments: surgery:    Aortic Valve Replacement Edwards Intuity Elite Rapid-deployment Pericardial Tissue Valve (size 25 mm, model # [id number], serial # [id number])  Disposition: 01-Home or Self Care   Discharge Medications:  The patient has been discharged on:   1.Beta Blocker:  Yes [x ]                              No   [   ]                              If No, reason:  2.Ace Inhibitor/ARB: Yes [ x  ]                                     No  [    ]                                     If No, reason:  3.Statin:   Yes [   ]                  No  [ x  ]                  If No, reason: No CAD  4.[place]:  Yes  [ x  ]                  No   [   ]                  If No, reason:   Medication List    TAKE these medications        acetaminophen 650 MG CR tablet  Commonly known as:  TYLENOL  Take 1,300 mg by mouth every 8 (eight) hours as needed for pain.     ALPRAZolam 0.5 MG tablet  Commonly known as:  XANAX  Take 0.5 mg by mouth at bedtime.     aspirin  325 MG EC tablet  Take 1 tablet (325 mg total) by mouth daily.     cephALEXin 500 MG capsule  Commonly known as:  KEFLEX  Take 1 capsule (500 mg total) by mouth every 8 (eight) hours. For 10 days     cholecalciferol 1000 UNITS tablet  Commonly known as:  VITAMIN D  Take 1,000 Units by mouth daily.     furosemide 40 MG tablet  Commonly known as:  LASIX  Take 1 tablet (40 mg total) by mouth daily.     insulin NPH Human 100 UNIT/ML injection  Commonly known as:  HUMULIN N,NOVOLIN N  Inject 30 Units into the skin 3 (three) times daily.     losartan 25 MG tablet  Commonly known as:  COZAAR  Take 1 tablet (25 mg total) by mouth daily.     meloxicam 7.5 MG tablet  Commonly known as:  MOBIC  Take 7.5 mg by mouth daily.     metoprolol 50 MG tablet  Commonly known as:  LOPRESSOR  Take 1 tablet (50 mg total) by mouth 2 (two) times daily.     oxyCODONE 5 MG immediate release tablet  Commonly known as:  Oxy IR/ROXICODONE  Take 1-2 tablets (5-10 mg total) by mouth every 3 (three) hours as needed for severe pain.     potassium chloride SA 20 MEQ tablet  Commonly known as:  K-DUR,KLOR-CON  Take 1 tablet (20 mEq total) by mouth daily.     sitaGLIPtin-metformin 50-1000 MG tablet  Commonly known as:  JANUMET  Take 1 tablet by mouth 2 (two) times daily with a meal.     traMADol 50 MG tablet  Commonly known as:  ULTRAM  Take 1-2 tablets (50-100 mg total) by mouth every 4 (four) hours as needed for moderate pain.     VICTOZA 18 MG/3ML Sopn  Generic drug:  Liraglutide  Inject 1.2 mg into the skin daily.     VITAMIN E PO  Take 1 capsule by mouth daily.       Follow-up Information    Follow up with [name], MD On 09/20/2015.   Specialty:  Cardiothoracic Surgery   Why:  Appointment is at 11:30   Contact information:   [place] [address] [phone]       Follow up with [facility] On 09/20/2015.   Why:  Please get CXR at 11:00    Contact information:   [facility]       Follow up with Carlyle Dolly, MD On 10/25/2015.   Specialty:  Cardiology   Why:  Appointment is at 4:00   Contact information:   [place] [address] [phone]       Signed: Ellwood Handler 08/22/2015, 8:01 AM

## 2015-08-21 ENCOUNTER — Encounter (HOSPITAL_COMMUNITY): Payer: Self-pay | Admitting: Physician Assistant

## 2015-08-21 ENCOUNTER — Inpatient Hospital Stay (HOSPITAL_COMMUNITY): Payer: Medicare Other

## 2015-08-21 LAB — BASIC METABOLIC PANEL
ANION GAP: 4 — AB (ref 5–15)
BUN: 20 mg/dL (ref 6–20)
CHLORIDE: 100 mmol/L — AB (ref 101–111)
CO2: 31 mmol/L (ref 22–32)
CREATININE: 1.16 mg/dL (ref 0.61–1.24)
Calcium: 8.2 mg/dL — ABNORMAL LOW (ref 8.9–10.3)
GFR calc non Af Amer: >60 mL/min (ref >60)
Glucose, Bld: 237 mg/dL — ABNORMAL HIGH (ref 65–99)
POTASSIUM: 4.2 mmol/L (ref 3.5–5.1)
SODIUM: 135 mmol/L (ref 135–145)

## 2015-08-21 LAB — CBC
HCT: 33 % — ABNORMAL LOW (ref 39.0–52.0)
Hemoglobin: 10.6 g/dL — ABNORMAL LOW (ref 13.0–17.0)
MCH: 29.1 pg (ref 26.0–34.0)
MCHC: 32.1 g/dL (ref 30.0–36.0)
MCV: 90.7 fL (ref 78.0–100.0)
Platelets: 161 10*3/uL (ref 150–400)
RBC: 3.64 MIL/uL — ABNORMAL LOW (ref 4.22–5.81)
RDW: 14.3 % (ref 11.5–15.5)
WBC: 10.5 10*3/uL (ref 4.0–10.5)

## 2015-08-21 LAB — GLUCOSE, CAPILLARY
GLUCOSE-CAPILLARY: 207 mg/dL — AB (ref 65–99)
GLUCOSE-CAPILLARY: 189 mg/dL — AB (ref 65–99)
GLUCOSE-CAPILLARY: 186 mg/dL — AB (ref 65–99)
Glucose-Capillary: 223 mg/dL — ABNORMAL HIGH (ref 65–99)
Glucose-Capillary: 168 mg/dL — ABNORMAL HIGH (ref 65–99)

## 2015-08-21 MED ORDER — LIRAGLUTIDE 18 MG/3ML ~~LOC~~ SOPN
1.2000 mg | PEN_INJECTOR | Freq: Every day | SUBCUTANEOUS | Status: DC
  Administered 2015-08-22: 1.2 mg via SUBCUTANEOUS

## 2015-08-21 MED ORDER — INSULIN DETEMIR 100 UNIT/ML ~~LOC~~ SOLN
30.0000 [IU] | Freq: Two times a day (BID) | SUBCUTANEOUS | Status: DC
  Administered 2015-08-21 – 2015-08-22 (×3): 30 [IU] via SUBCUTANEOUS
  Filled 2015-08-21 (×5): qty 0.3

## 2015-08-21 MED ORDER — LACTULOSE 10 GM/15ML PO SOLN
20.0000 g | Freq: Every day | ORAL | Status: DC | PRN
  Filled 2015-08-21: qty 30

## 2015-08-21 NOTE — Progress Notes (Addendum)
      [address]       [place] [address]             [phone]      3 Days Post-Op Procedure(s) (LRB): AORTIC VALVE REPLACEMENT (AVR) (N/A) TRANSESOPHAGEAL ECHOCARDIOGRAM (TEE) (N/A)   Subjective:  [name] continues to complain of swelling and pain in his right hand.  He was also hoping he could go home today.  He is ambulating    No BM  Objective: Vital signs in last 24 hours: Temp:  [97.5 F (36.4 C)-99.4 F (37.4 C)] 98.4 F (36.9 C) (12/17 0511) Pulse Rate:  [83-108] 94 (12/17 0511) Cardiac Rhythm:  [-] Heart block (12/16 1920) Resp:  [11-18] 17 (12/17 0511) BP: (92-141)/(60-73) 126/71 mmHg (12/17 0511) SpO2:  [90 %-97 %] 90 % (12/17 0511) Weight:  [270 lb 11.6 oz (122.8 kg)] 270 lb 11.6 oz (122.8 kg) (12/17 0511)  Intake/Output from previous day: 12/16 0701 - 12/17 0700 In: 970 [P.O.:960; I.V.:10] Out: 700 [Urine:700]  General appearance: alert, cooperative and no distress Heart: regular rate and rhythm Lungs: clear to auscultation bilaterally Abdomen: soft, non-tender; bowel sounds normal; no masses,  no organomegaly Extremities: edema trace, R hand swollen, no ecchymosis, erythema noted Wound: clean and dry  Lab Results:  Recent Labs  08/20/15 0319 08/21/15 0408  WBC 12.3* 10.5  HGB 11.2* 10.6*  HCT 34.2* 33.0*  PLT 147* 161   BMET:  Recent Labs  08/20/15 0319 08/21/15 0408  NA 138 135  K 4.5 4.2  CL 104 100*  CO2 28 31  GLUCOSE 107* 237*  BUN 18 20  CREATININE 1.06 1.16  CALCIUM 8.1* 8.2*    PT/INR:  Recent Labs  08/18/15 1436  LABPROT 17.1*  INR 1.38   ABG    Component Value Date/Time   PHART 7.335* 08/19/2015 0530   HCO3 22.5 08/19/2015 0530   TCO2 25 08/19/2015 1552   ACIDBASEDEF 3.0* 08/19/2015 0530   O2SAT 97.0 08/19/2015 0530   CBG (last 3)   Recent Labs  08/20/15 2114 08/21/15 0354 08/21/15 0634  GLUCAP 296* 207* 189*    Assessment/Plan: S/P Procedure(s) (LRB): AORTIC VALVE REPLACEMENT  (AVR) (N/A) TRANSESOPHAGEAL ECHOCARDIOGRAM (TEE) (N/A)  1. CV- hemodynamically stable, maintaining NSR- continue Lopressor, Cozaar 2. Pulm- no acute issues, CXR this morning with stable apical pneumothorax, will repeat CXR in AM 3. Renal- creatinine mildly elevated at 1.16, remain hypervolemic, continue Lasix 4. Right hand swelling- no acute signs of phlebitis... Continue elevation and ice for now  5. DM- sugars remain elevated, on home victoza, will increase levemir dose... Continue to monitor if creatinine remains stable tomorrow can restart Metformin 6. Dispo- patients biggest complaint is his right hand... Continue ice and elevation and will follow, d/c EPW, maintaining NSR   LOS: 3 days    BARRETT, ERIN 08/21/2015  Patient seen and examined, agree with above His hand is his primary complaint- no signs of cellulitis at present Possibly home tomorrow if he continues to progress  Remo Lipps C. Roxan Hockey, MD [facility] [phone]

## 2015-08-21 NOTE — Progress Notes (Addendum)
CARDIAC REHAB PHASE I   41-1430  Pt walked about an hour ago with son and will walk again this evening. Pt R hand is swollen encouraged ice and elevation. Did education with pt and son in which the following were discussed: risk factors, HH diet, diabetes nutrition,sternal precautions, activity restrictions (bed mobility, functional movement, sit to stand), and exercise guidelines. Encouraged use of IS. Referred pt to [facility] in [place].   Brent Cobb D [place] Somma,MS,ACSM-RCEP 08/21/2015 2:27 PM

## 2015-08-22 ENCOUNTER — Inpatient Hospital Stay (HOSPITAL_COMMUNITY): Payer: Medicare Other

## 2015-08-22 LAB — GLUCOSE, CAPILLARY
GLUCOSE-CAPILLARY: 247 mg/dL — AB (ref 65–99)
Glucose-Capillary: 208 mg/dL — ABNORMAL HIGH (ref 65–99)
Glucose-Capillary: 250 mg/dL — ABNORMAL HIGH (ref 65–99)
Glucose-Capillary: 279 mg/dL — ABNORMAL HIGH (ref 65–99)

## 2015-08-22 MED ORDER — TRAMADOL HCL 50 MG PO TABS: 50.0000 mg | ORAL_TABLET | ORAL | Status: DC | PRN

## 2015-08-22 MED ORDER — METOPROLOL TARTRATE 25 MG PO TABS: 25.0000 mg | ORAL_TABLET | Freq: Two times a day (BID) | ORAL | Status: DC

## 2015-08-22 MED ORDER — CEPHALEXIN 500 MG PO CAPS: 500.0000 mg | ORAL_CAPSULE | Freq: Three times a day (TID) | ORAL | Status: DC

## 2015-08-22 MED ORDER — LOSARTAN POTASSIUM 25 MG PO TABS: 25.0000 mg | ORAL_TABLET | Freq: Every day | ORAL | Status: DC

## 2015-08-22 MED ORDER — ASPIRIN 325 MG PO TBEC: 325.0000 mg | DELAYED_RELEASE_TABLET | Freq: Every day | ORAL | Status: DC

## 2015-08-22 MED ORDER — OXYCODONE HCL 5 MG PO TABS: 5.0000 mg | ORAL_TABLET | ORAL | Status: DC | PRN

## 2015-08-22 MED ORDER — POTASSIUM CHLORIDE CRYS ER 20 MEQ PO TBCR: 20.0000 meq | EXTENDED_RELEASE_TABLET | Freq: Every day | ORAL | Status: DC

## 2015-08-22 MED ORDER — CEPHALEXIN 500 MG PO CAPS
500.0000 mg | ORAL_CAPSULE | Freq: Three times a day (TID) | ORAL | Status: DC
  Administered 2015-08-22 – 2015-08-23 (×4): 500 mg via ORAL
  Filled 2015-08-22 (×4): qty 1

## 2015-08-22 MED ORDER — FUROSEMIDE 40 MG PO TABS
40.0000 mg | ORAL_TABLET | Freq: Every day | ORAL | Status: DC
Start: 2015-08-22 — End: 2015-08-23

## 2015-08-22 NOTE — Progress Notes (Addendum)
      [address]       [place] [address]             [phone]      4 Days Post-Op Procedure(s) (LRB): AORTIC VALVE REPLACEMENT (AVR) (N/A) TRANSESOPHAGEAL ECHOCARDIOGRAM (TEE) (N/A)   Subjective:  Brent Cobb has no new complaints.  Continues to have swelling of right hand, but states its a little better.  He wants to go home.  Objective: Vital signs in last 24 hours: Temp:  [97.7 F (36.5 C)-98.4 F (36.9 C)] 98.4 F (36.9 C) (12/18 0601) Pulse Rate:  [91-108] 108 (12/18 0601) Cardiac Rhythm:  [-] Sinus tachycardia (12/17 1941) Resp:  [17-18] 18 (12/18 0601) BP: (95-120)/(58-69) 120/69 mmHg (12/18 0601) SpO2:  [89 %-96 %] 96 % (12/18 0601) Weight:  [271 lb 9.6 oz (123.197 kg)] 271 lb 9.6 oz (123.197 kg) (12/18 0601)  Intake/Output from previous day: 12/17 0701 - 12/18 0700 In: 1040 [P.O.:1040] Out: -   General appearance: alert, cooperative and no distress Heart: regular rate and rhythm Lungs: clear to auscultation bilaterally Abdomen: soft, non-tender; bowel sounds normal; no masses,  no organomegaly Extremities: edema trace, R hand remains swollen, less than yesterday, warm to touch, minimal erythema Wound: clean and dry  Lab Results:  Recent Labs  08/20/15 0319 08/21/15 0408  WBC 12.3* 10.5  HGB 11.2* 10.6*  HCT 34.2* 33.0*  PLT 147* 161   BMET:  Recent Labs  08/20/15 0319 08/21/15 0408  NA 138 135  K 4.5 4.2  CL 104 100*  CO2 28 31  GLUCOSE 107* 237*  BUN 18 20  CREATININE 1.06 1.16  CALCIUM 8.1* 8.2*    PT/INR: No results for input(s): LABPROT, INR in the last 72 hours. ABG    Component Value Date/Time   PHART 7.335* 08/19/2015 0530   HCO3 22.5 08/19/2015 0530   TCO2 25 08/19/2015 1552   ACIDBASEDEF 3.0* 08/19/2015 0530   O2SAT 97.0 08/19/2015 0530   CBG (last 3)   Recent Labs  08/21/15 1640 08/21/15 2105 08/22/15 0600  GLUCAP 186* 168* 208*    Assessment/Plan: S/P Procedure(s) (LRB): AORTIC VALVE  REPLACEMENT (AVR) (N/A) TRANSESOPHAGEAL ECHOCARDIOGRAM (TEE) (N/A)  1. CV- Sinus Tach, mild this morning- continue Lopressor, cozaar 2. Pulm- no acute issues, CXR pneumothorax remains stable, continue IS 3. Renal- weight is minimally elevated, continue Lasix, will taper at discharge 4. Early Cellulitis- R hand, swelling has decreased, hand how warm to touch, erythematous will start Keflex 5. DM- will resume home regimen 6. Dispo- patient stable, R hand now with early cellulitis, will start keflex, patient wants to go home today, will discuss with staff   LOS: 4 days    Brent Cobb, Brent Cobb 08/22/2015  Patient seen nad examined, agree with above I agree with Brent Cobb that hand is warmer and redder than it was yesterday I strongly recommended he stay overnight so we can make sure this begins to improve with PO antibiotics before he goes home  [place]. Brent Hockey, MD [facility] [phone]

## 2015-08-22 NOTE — Care Management Note (Signed)
Case Management Note  Patient Details  Name: Brent Cobb MRN: [id number] Date of Birth: 1946-07-15  Subjective/Objective:                  AORTIC VALVE REPLACEMENT   Action/Plan: CM spoke with patient at the bedside. Patient lives in [place], [place]. He selected [place] for [facility]. Referral called to [facility] at [facility]. Faxed order and  H&P.   Expected Discharge Date:    08/22/15              Expected Discharge Plan:  [place]  In-House Referral:     Discharge planning Services  CM Consult  Post Acute Care Choice:    Choice offered to:  Patient  DME Arranged:  N/A DME Agency:  NA  HH Arranged:  RN [place] Agency:  [place]  Status of Service:  Completed, signed off  Medicare Important Message Given:    Date Medicare IM Given:    Medicare IM give by:    Date Additional Medicare IM Given:    Additional Medicare Important Message give by:     If discussed at [place] of Stay Meetings, dates discussed:    Additional Comments:  Apolonio Schneiders, RN 08/22/2015, 12:37 PM

## 2015-08-23 LAB — GLUCOSE, CAPILLARY
GLUCOSE-CAPILLARY: 262 mg/dL — AB (ref 65–99)
GLUCOSE-CAPILLARY: 246 mg/dL — AB (ref 65–99)
GLUCOSE-CAPILLARY: 259 mg/dL — AB (ref 65–99)

## 2015-08-23 MED ORDER — METOPROLOL TARTRATE 50 MG PO TABS
50.0000 mg | ORAL_TABLET | Freq: Two times a day (BID) | ORAL | Status: DC
  Administered 2015-08-23: 50 mg via ORAL
  Filled 2015-08-23 (×2): qty 1

## 2015-08-23 MED ORDER — CEPHALEXIN 500 MG PO CAPS: 500.0000 mg | ORAL_CAPSULE | Freq: Three times a day (TID) | ORAL | Status: DC

## 2015-08-23 MED ORDER — POTASSIUM CHLORIDE CRYS ER 20 MEQ PO TBCR
20.0000 meq | EXTENDED_RELEASE_TABLET | Freq: Every day | ORAL | Status: DC
  Administered 2015-08-23: 20 meq via ORAL
  Filled 2015-08-23: qty 1

## 2015-08-23 MED ORDER — LOSARTAN POTASSIUM 25 MG PO TABS: 25.0000 mg | ORAL_TABLET | Freq: Every day | ORAL | Status: DC

## 2015-08-23 MED ORDER — POTASSIUM CHLORIDE CRYS ER 20 MEQ PO TBCR: 20.0000 meq | EXTENDED_RELEASE_TABLET | Freq: Every day | ORAL | Status: DC

## 2015-08-23 MED ORDER — INSULIN DETEMIR 100 UNIT/ML ~~LOC~~ SOLN
40.0000 [IU] | Freq: Two times a day (BID) | SUBCUTANEOUS | Status: DC
  Administered 2015-08-23: 40 [IU] via SUBCUTANEOUS
  Filled 2015-08-23 (×2): qty 0.4

## 2015-08-23 MED ORDER — FUROSEMIDE 40 MG PO TABS
40.0000 mg | ORAL_TABLET | Freq: Every day | ORAL | Status: DC
Start: 2015-08-23 — End: 2015-08-23
  Administered 2015-08-23: 40 mg via ORAL
  Filled 2015-08-23: qty 1

## 2015-08-23 MED ORDER — FUROSEMIDE 40 MG PO TABS: 40.0000 mg | ORAL_TABLET | Freq: Every day | ORAL | Status: DC

## 2015-08-23 MED ORDER — METOPROLOL TARTRATE 50 MG PO TABS: 50.0000 mg | ORAL_TABLET | Freq: Two times a day (BID) | ORAL | Status: DC

## 2015-08-23 NOTE — Care Management (Signed)
CM contacted and informed that [facility] would not be able to provided [facility] services until 08/31/15.  [place] services were completed during and by weekend CM.  CM contacted pt and informed that chosen agency would have delay in initiation of services, CM will attempt to set up services with another agency in the [place] area.

## 2015-08-23 NOTE — Care Management Note (Signed)
Case Management Note  Patient Details  Name: Brent Cobb MRN: [id number] Date of Birth: 03/19/1946  Subjective/Objective:                  AORTIC VALVE REPLACEMENT   Action/Plan: CM spoke with patient at the bedside. Patient lives in [place], [place]. He selected [place] for [facility]. Referral called to [facility] at [facility]. Faxed order and  H&P.   Expected Discharge Date:    08/22/15              Expected Discharge Plan:  [place]  In-House Referral:     Discharge planning Services  CM Consult  Post Acute Care Choice:    Choice offered to:  Patient  DME Arranged:  N/A DME Agency:  NA  HH Arranged:  RN [place] Agency:  [place]  Status of Service:  Completed, signed off  Medicare Important Message Given:    Date Medicare IM Given:    Medicare IM give by:    Date Additional Medicare IM Given:    Additional Medicare Important Message give by:     If discussed at [place] of Stay Meetings, dates discussed:    Additional Comments: CM assessed pt, pt is in agreement with [facility], denies any other needs at this time Maryclare Labrador, RN 08/23/2015, 9:58 AM

## 2015-08-23 NOTE — Progress Notes (Signed)
Pt in stable condition. Sutures removed, iv line taken out, cardiac monitor dc, ccmd notified. Pt belongings at bedside, went over discharge instructions with pt and family, pt verbalized understanding, paper prescriptions given to pt. Pt taken off the floor via wheelchair. Oren Beckmann, RN

## 2015-08-23 NOTE — Progress Notes (Addendum)
[address]       [place] [address]             [phone]      5 Days Post-Op Procedure(s) (LRB): AORTIC VALVE REPLACEMENT (AVR) (N/A) TRANSESOPHAGEAL ECHOCARDIOGRAM (TEE) (N/A) Subjective: Feels reasonably well overall  Objective: Vital signs in last 24 hours: Temp:  [98.2 F (36.8 C)-98.8 F (37.1 C)] 98.2 F (36.8 C) (12/19 0544) Pulse Rate:  [98-103] 98 (12/19 0544) Cardiac Rhythm:  [-] Sinus tachycardia (12/18 2015) Resp:  [18] 18 (12/19 0544) BP: (96-127)/(64-75) 127/75 mmHg (12/19 0544) SpO2:  [91 %-94 %] 94 % (12/19 0544) Weight:  [265 lb (120.203 kg)] 265 lb (120.203 kg) (12/19 0544)  Hemodynamic parameters for last 24 hours:    Intake/Output from previous day: 12/18 0701 - 12/19 0700 In: 320 [P.O.:320] Out: -  Intake/Output this shift:    General appearance: alert, cooperative and no distress Heart: regular rate and rhythm and no murmur Lungs: mild dim in bases Abdomen: + distended, + BS, nontender Extremities: + LE edema Wound: chest incis healing well Right hand is edematous and erythematous, non-tender to palp  Lab Results:  Recent Labs  08/21/15 0408  WBC 10.5  HGB 10.6*  HCT 33.0*  PLT 161   BMET:  Recent Labs  08/21/15 0408  NA 135  K 4.2  CL 100*  CO2 31  GLUCOSE 237*  BUN 20  CREATININE 1.16  CALCIUM 8.2*    PT/INR: No results for input(s): LABPROT, INR in the last 72 hours. ABG    Component Value Date/Time   PHART 7.335* 08/19/2015 0530   HCO3 22.5 08/19/2015 0530   TCO2 25 08/19/2015 1552   ACIDBASEDEF 3.0* 08/19/2015 0530   O2SAT 97.0 08/19/2015 0530   CBG (last 3)   Recent Labs  08/22/15 1619 08/22/15 2117 08/23/15 0604  GLUCAP 247* 279* 262*    Meds Scheduled Meds: . acetaminophen  1,000 mg Oral 4 times per day  . antiseptic oral rinse  7 mL Mouth Rinse BID  . aspirin EC  325 mg Oral Daily  . bisacodyl  10 mg Oral Daily   Or  . bisacodyl  10 mg Rectal Daily  . cephALEXin   500 mg Oral 3 times per day  . docusate sodium  200 mg Oral Daily  . enoxaparin (LOVENOX) injection  30 mg Subcutaneous Q24H  . insulin aspart  0-20 Units Subcutaneous TID WC  . insulin aspart  0-5 Units Subcutaneous QHS  . insulin detemir  30 Units Subcutaneous BID  . Liraglutide  1.2 mg Subcutaneous Daily  . losartan  25 mg Oral Daily  . metoprolol tartrate  25 mg Oral BID  . pantoprazole  40 mg Oral Daily  . sodium bicarbonate  50 mEq Intravenous Once  . sodium chloride  3 mL Intravenous Q12H  . sodium chloride  3 mL Intravenous Q12H   Continuous Infusions: . sodium chloride Stopped (08/20/15 0900)   PRN Meds:.sodium chloride, lactulose, metoprolol, ondansetron (ZOFRAN) IV, oxyCODONE, sodium chloride, sodium chloride, traMADol  Xrays Dg Chest 2 View  08/22/2015  CLINICAL DATA:  LEFT pneumothorax, type II diabetes mellitus, former smoker, AVR 08/18/2015 EXAM: CHEST  2 VIEW COMPARISON:  08/21/2015 FINDINGS: Enlargement of cardiac silhouette post AVR. Mediastinal contours and pulmonary vascularity normal. Small biapical pneumothoraces RIGHT greater than LEFT unchanged. Bibasilar atelectasis. Question minimal RIGHT upper lobe infiltrate. Remaining lungs clear. Small bibasilar effusions. Bones demineralized. IMPRESSION: Persistent small biapical pneumothoraces. Bibasilar  effusions atelectasis. Cannot exclude minimal RIGHT upper lobe infiltrate. Electronically Signed   By: Lavonia Dana M.D.   On: 08/22/2015 10:12    Assessment/Plan: S/P Procedure(s) (LRB): AORTIC VALVE REPLACEMENT (AVR) (N/A) TRANSESOPHAGEAL ECHOCARDIOGRAM (TEE) (N/A)  1 steady overall progress 2 Having some atrial tachycardias to 120's, possibly atrial flutter will increase betablocker, may need additional anti-dysrhythmic 3 right hand cellulitis- cont keflex, no fevers, hopefully will not require IVabx 4 sugars poorly controlled, increase insulin dose 5 he needs further diuresis for volume overload- will restart  lasix/K+   LOS: 5 days    Brent Cobb,Brent Cobb 08/23/2015  I have seen and examined the patient and agree with the assessment and plan as outlined.  I have reviewed telemetry for last 48 hours and see no evidence of atrial dysrhythmias, just NSR or sinus tach.  Agree with increasing beta blocker.  I think Brent Cobb can go home.  Continue Keflex for 7 days.  Continue ice packs and keep right arm elevated.  Resume home medication regimen for diabetes.  Continue lasix and potassium  [name], MD 08/23/2015 9:18 AM

## 2015-08-23 NOTE — Progress Notes (Signed)
CARDIAC REHAB PHASE I   Attempted to ambulate with pt. Pt sitting up in chair, states he is waiting to go home, declines ambulation at this time. Pt states he has no questions regarding education at this time. Pt son at bedside, call bell within reach.   Lenna Sciara, RN, BSN 08/23/2015 9:57 AM

## 2015-08-23 NOTE — Care Management Important Message (Signed)
Important Message  Patient Details  Name: Brent Cobb MRN: [id number] Date of Birth: June 03, 1946   Medicare Important Message Given:  Yes    Nathen May 08/23/2015, 1:13 PM

## 2015-08-24 NOTE — Care Management (Signed)
08/24/2015 CM was unable to get referral accepted by another agency for his location to begin services prior to 08/31/15.  CM contacted pt/wife; pt still wants to have [facility] provide [facility] and are in agreement to begin services on 08/31/15, pt doesn't have immediate HHRN need.   CM contacted [facility] to ensure all needed documentation has been provided to begin service; CM was informed that at this time services would begin on 09/01/15.  CM contacted pt/wife and they are agreeable.  CM will refax documents to agency.  08/23/15 CM contacted and informed that [facility] would not be able to provided [facility] services until 08/31/15.  [place] services were completed during and by weekend CM.  CM contacted pt and informed that chosen agency would have delay in initiation of services, CM will attempt to set up services with another agency in the [place] area.

## 2015-09-01 NOTE — Care Management (Signed)
09/01/2015  CM was again contacted by agency requesting documents for services to begin.  Previously CM was assured by Mariann Laster at agency that all required documents were received and services would begin 09/01/15.  Per Mariann Laster; pts wife contacted agency and it was at this time that agency noticed that required documents were not available.  CM refaxed requested documents at 1110  and requested Mariann Laster to contact case manager if documents were not received within 30 mins.  Mariann Laster informed CM that services can still began today once documents are refaxed.  08/24/15 CM was unable to get referral accepted by another agency for his location to begin services prior to 08/31/15.  CM contacted pt/wife; pt still wants to have [facility] provide [facility] and are in agreement to begin services on 08/31/15, pt doesn't have immediate HHRN need.   CM contacted [facility] to ensure all needed documentation has been provided to begin service; CM was informed that at this time services would begin on 09/01/15.  CM contacted pt/wife and they are agreeable.  CM will refax documents to agency.  08/23/15 CM contacted and informed that [facility] would not be able to provided [facility] services until 08/31/15.  [place] services were completed during and by weekend CM.  CM contacted pt and informed that chosen agency would have delay in initiation of services, CM will attempt to set up services with another agency in the [place] area.

## 2015-09-02 ENCOUNTER — Other Ambulatory Visit: Payer: Self-pay | Admitting: Physician Assistant

## 2015-09-02 DIAGNOSIS — G8918 Other acute postprocedural pain: Secondary | ICD-10-CM

## 2015-09-02 MED ORDER — TRAMADOL HCL 50 MG PO TABS: ORAL_TABLET | ORAL | Status: DC

## 2015-09-08 ENCOUNTER — Ambulatory Visit (INDEPENDENT_AMBULATORY_CARE_PROVIDER_SITE_OTHER): Payer: Medicare Other | Admitting: Cardiology

## 2015-09-08 VITALS — BP 127/78 | HR 85 | Wt 254.0 lb

## 2015-09-08 DIAGNOSIS — I350 Nonrheumatic aortic (valve) stenosis: Secondary | ICD-10-CM | POA: Diagnosis not present

## 2015-09-08 DIAGNOSIS — E118 Type 2 diabetes mellitus with unspecified complications: Secondary | ICD-10-CM | POA: Diagnosis not present

## 2015-09-08 MED ORDER — FUROSEMIDE 40 MG PO TABS: ORAL_TABLET | ORAL | Status: DC

## 2015-09-08 NOTE — Progress Notes (Signed)
Patient ID: Brent Cobb, male   DOB: 1946-04-19, 70 y.o.   MRN: [id number]     Clinical Summary Mr. Haselhuhn is a 70 y.o.male seen today for follow up of the following medical problems.   1. Aortic stenosis - s/p AVR 08/2015 with a 25 mm Edwards peridcardial tissue valve.   - denies any SOB. Denies any chest pain.  2. CAD - cath 07/2015 showed mild disease, he did have some 70-80% disease in small branches of the LCX that was managed medically.  - reports leg pains on statins, has not been on    3. Arm/hand pain - started after recent AVR, treated for possible celluliits with keflex. Pain improving, he has completed keflex course   Past Medical History  Diagnosis Date  . Diabetes mellitus ([place])   . Severe aortic stenosis 07/21/2015  . Type II diabetes mellitus (HCC)     Insulin-dependent  . Chronic diastolic congestive heart failure (HCC)     NYHA functional class II  . Obesity   . Obstructive sleep apnea     Intolerant of CPAP  . Varicose veins   . Cancer ([place])   . Heart murmur   . Asthma     as a child  . Arthritis   . 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 acetaminophen (TYLENOL) 650 MG CR tablet Take 1,300 mg by mouth every 8 (eight) hours as needed for pain.    Marland Kitchen ALPRAZolam (XANAX) 0.5 MG tablet Take 0.5 mg by mouth at bedtime.    Marland Kitchen aspirin EC 325 MG EC tablet Take 1 tablet (325 mg total) by mouth daily. 30 tablet 0  . cephALEXin (KEFLEX) 500 MG capsule Take 1 capsule (500 mg total) by mouth every 8 (eight) hours. For 10 days 26 capsule 0  . cholecalciferol (VITAMIN D) 1000 UNITS tablet Take 1,000 Units by mouth daily.    . furosemide (LASIX) 40 MG tablet Take 1 tablet (40 mg total) by mouth daily. 30 tablet 1  . insulin NPH Human (HUMULIN N,NOVOLIN N) 100 UNIT/ML  injection Inject 30 Units into the skin 3 (three) times daily.    . Liraglutide (VICTOZA) 18 MG/3ML SOPN Inject 1.2 mg into the skin daily.     Marland Kitchen losartan (COZAAR) 25 MG tablet Take 1 tablet (25 mg total) by mouth daily. 30 tablet 3  . meloxicam (MOBIC) 7.5 MG tablet Take 7.5 mg by mouth daily.    . metoprolol (LOPRESSOR) 50 MG tablet Take 1 tablet (50 mg total) by mouth 2 (two) times daily. 60 tablet 1  . oxyCODONE (OXY IR/ROXICODONE) 5 MG immediate release tablet Take 1-2 tablets (5-10 mg total) by mouth every 3 (three) hours as needed for severe pain. 30 tablet 0  . potassium chloride SA (K-DUR,KLOR-CON) 20 MEQ tablet Take 1 tablet (20 mEq total) by mouth daily. 30 tablet 1  . sitaGLIPtin-metformin (JANUMET) 50-1000 MG tablet Take 1 tablet by mouth 2 (two) times daily with a meal.    . traMADol (ULTRAM) 50 MG tablet TAKE ONE TO TWO TABLETS BY MOUTH EVERY 4 HOURS AS NEEDED FOR MODERATE PAIN 40 tablet 0  . VITAMIN E PO Take 1 capsule by mouth daily.     No current facility-administered medications for this visit.     Past Surgical History  Procedure  Laterality Date  . Polypectomy  03/21/2010  . Cervical spine surgery      30 years ago and last time 10 years ago  . Skin cancer excision  1991    removal of skin cancer to right cheek area  . Knee surgery Left 2013  . Shoulder surgery Left 2013  . Cardiac catheterization N/A 07/21/2015    Procedure: Right/Left Heart Cath and Coronary Angiography;  Surgeon: Sherren Mocha, MD;  Location: [facility];  Service: Cardiovascular;  Laterality: N/A;  . Colonoscopy    . Aortic valve replacement N/A 08/18/2015    Procedure: AORTIC VALVE REPLACEMENT (AVR);  Surgeon: [name], MD;  Location: [facility];  Service: Open Heart Surgery;  Laterality: N/A;  . Tee without cardioversion N/A 08/18/2015    Procedure: TRANSESOPHAGEAL ECHOCARDIOGRAM (TEE);  Surgeon: [name], MD;  Location: [facility];  Service: Open Heart Surgery;  Laterality: N/A;       No Known Allergies    Family History  Problem Relation Age of Onset  . Heart attack Mother   . Alcoholism Father   . Diabetes Brother   . Valvular heart disease Sister      Social History Mr. Hagedorn reports that he quit smoking about 26 years ago. His smoking use included Cigarettes. He started smoking about 58 years ago. He has a 49.5 pack-year smoking history. He quit smokeless tobacco use about 46 years ago. Mr. Blasingame reports that he does not drink alcohol.   Review of Systems CONSTITUTIONAL: No weight loss, fever, chills, weakness or fatigue.  HEENT: Eyes: No visual loss, blurred vision, double vision or yellow sclerae.No hearing loss, sneezing, congestion, runny nose or sore throat.  SKIN: No rash or itching.  CARDIOVASCULAR: per HPI RESPIRATORY: No shortness of breath, cough or sputum.  GASTROINTESTINAL: No anorexia, nausea, vomiting or diarrhea. No abdominal pain or blood.  GENITOURINARY: No burning on urination, no polyuria NEUROLOGICAL: No headache, dizziness, syncope, paralysis, ataxia, numbness or tingling in the extremities. No change in bowel or bladder control.  MUSCULOSKELETAL: right arm/hand pain LYMPHATICS: No enlarged nodes. No history of splenectomy.  PSYCHIATRIC: No history of depression or anxiety.  ENDOCRINOLOGIC: No reports of sweating, cold or heat intolerance. No polyuria or polydipsia.  Marland Kitchen   Physical Examination Filed Vitals:   09/08/15 1504  BP: 127/78  Pulse: 85   Filed Vitals:   09/08/15 1504  Weight: 254 lb (115.214 kg)    Gen: resting comfortably, no acute distress HEENT: no scleral icterus, pupils equal round and reactive, no palptable cervical adenopathy,  CV: RRR, no m/r/g, no jvd Resp: Clear to auscultation bilaterally GI: abdomen is soft, non-tender, non-distended, normal bowel sounds, no hepatosplenomegaly MSK: extremities are warm, no edema.  Skin: warm, no rash Neuro:  no focal deficits Psych: appropriate  affect   Diagnostic Studies 07/2015 echo Study Conclusions  - Left ventricle: The cavity size was normal. Wall thickness was increased in a pattern of severe LVH. Systolic function was normal. The estimated ejection fraction was in the range of 60% to 65%. Wall motion was normal; there were no regional wall motion abnormalities. Doppler parameters are consistent with abnormal left ventricular relaxation (grade 1 diastolic dysfunction). - Aortic valve: Severely calcified annulus. Trileaflet; severely thickened leaflets. There was severe stenosis. The contour of the spectral Doppler waveform may lead to underestimation of severity. Valve area (VTI): 1.01 cm^2. Valve area (Vmax): 0.96 cm^2. Valve area (Vmean): 0.79 cm^2. - Mitral valve: Mildly calcified annulus. Mildly thickened  leaflets . - Left atrium: The atrium was mildly dilated. - Technically adequate study.   07/2015 Cath Dominance: Left   Left Main   . LM lesion, 30% stenosed.     Left Circumflex   . Mid Cx lesion, 25% stenosed.   . Second Obtuse Marginal Branch   The vessel is small in size. There is moderate disease in the vessel.   . Third Obtuse Marginal Branch   The vessel is small in size. There is moderate disease in the vessel.   . First Left Posterolateral Branch   . 1st LPL lesion, 75% stenosed.   . Third Left Posterolateral Branch   . 3rd LPL lesion, 80% stenosed. Small vessel     Right Coronary Artery  Patent, nondominant RCA     Fick Cardiac Output  5.51 L/min    Fick Cardiac Output Index  2.29 (L/min)/BSA   RA A Wave  6 mmHg   RA V Wave  6 mmHg   RA Mean  4 mmHg   RV Systolic Pressure  34 mmHg   RV Diastolic Pressure  1 mmHg   RV EDP  8 mmHg   PA Systolic Pressure  32 mmHg   PA Diastolic Pressure  10 mmHg   PA Mean  21 mmHg   PW A Wave  11 mmHg   PW V Wave  9 mmHg   PW Mean  8 mmHg   AO Systolic Pressure  [id number] mmHg   AO Diastolic Pressure  68 mmHg    AO Mean  97 mmHg   QP/QS  1   TPVR Index  9.19 HRUI   TSVR Index  42.44 HRUI   PVR SVR Ratio  0.14   TPVR/TSVR Ratio  0.22     Assessment and Plan  1. Severe aortic stenosis - s/p AVR, he is recovering wel - will start cardiac rehab once cleared by CT surgery  2. CAD - no current symptoms - continue risk factor modificaiton and secondary prevention.     F/u 6 weeks   Arnoldo Lenis, M.D.

## 2015-09-08 NOTE — Patient Instructions (Addendum)
Your physician recommends that you schedule a follow-up appointment in: [place] DR. L[place]  Your physician has recommended you make the following change in your medication:   CHANGE LASIX TO DAILY AS NEEDED FOR SWELLING  STOP [facility]  Your physician recommends that you return for lab work CMP/CBC/LIPIDS/HGBA1C/TSH  Thank you for choosing [facility]!!

## 2015-09-10 ENCOUNTER — Encounter: Payer: Self-pay | Admitting: Cardiology

## 2015-09-17 ENCOUNTER — Other Ambulatory Visit: Payer: Self-pay | Admitting: Thoracic Surgery (Cardiothoracic Vascular Surgery)

## 2015-09-17 DIAGNOSIS — Z952 Presence of prosthetic heart valve: Secondary | ICD-10-CM

## 2015-09-20 ENCOUNTER — Encounter: Payer: Self-pay | Admitting: Thoracic Surgery (Cardiothoracic Vascular Surgery)

## 2015-09-20 ENCOUNTER — Ambulatory Visit (INDEPENDENT_AMBULATORY_CARE_PROVIDER_SITE_OTHER): Payer: Self-pay | Admitting: Thoracic Surgery (Cardiothoracic Vascular Surgery)

## 2015-09-20 ENCOUNTER — Ambulatory Visit
Admission: RE | Admit: 2015-09-20 | Discharge: 2015-09-20 | Disposition: A | Discharge status: Home / Self Care | Payer: Medicare Other | Source: Ambulatory Visit | Attending: Thoracic Surgery (Cardiothoracic Vascular Surgery) | Admitting: Thoracic Surgery (Cardiothoracic Vascular Surgery)

## 2015-09-20 VITALS — BP 154/72 | HR 83 | Resp 20 | Ht 73.0 in | Wt 254.0 lb

## 2015-09-20 DIAGNOSIS — Z952 Presence of prosthetic heart valve: Secondary | ICD-10-CM

## 2015-09-20 DIAGNOSIS — I350 Nonrheumatic aortic (valve) stenosis: Secondary | ICD-10-CM

## 2015-09-20 DIAGNOSIS — Z953 Presence of xenogenic heart valve: Secondary | ICD-10-CM

## 2015-09-20 DIAGNOSIS — Z954 Presence of other heart-valve replacement: Secondary | ICD-10-CM

## 2015-09-20 MED ORDER — LOSARTAN POTASSIUM 25 MG PO TABS: 50.0000 mg | ORAL_TABLET | Freq: Every day | ORAL | Status: DC

## 2015-09-20 NOTE — Patient Instructions (Addendum)
Continue to avoid any heavy lifting or strenuous use of your arms or shoulders for at least a total of three months from the time of surgery.  After three months you may gradually increase how much you lift or otherwise use your arms or chest as tolerated, with limits based upon whether or not activities lead to the return of significant discomfort.  Increase losartan (Cozaar) to 50 mg daily.  Otherwise continue all previous medications without any changes at this time  Enroll and participate in the outpatient cardiac rehab program beginning as soon as practical.

## 2015-09-20 NOTE — Progress Notes (Signed)
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  His early postoperative recovery in the hospital was essentially uncomplicated with exception of the development of severe swelling and redness involving his right hand and forearm. This appeared to be related to an infiltrated IV immediately overlying the right radial artery. There was no associated compromise of circulation and the swelling rapidly improved, although the patient's hand remained numb In the radial nerve distribution at the time of hospital discharge.  He was treated with conservative measures for swelling and a 7 day course of oral Keflex for possible cellulitis.  He otherwise did quite well and was discharged from the hospital on the fifth postoperative day.  His dose of losartan was reduced at the time of hospital discharge from his preoperative dose because his blood pressure was marginal.  Since hospital discharge she has continued to do quite well. He was seen in follow-up by Dr. Harl Bowie on 09/08/2015 and he returns to our office for routine follow-up today. The patient states that with exception of persistent numbness involving his thumb, index finger, and middle finger of the right hand he is doing exceptionally well. He has never had much pain in his chest and he has not been taking any sort of pain relievers. He states that his breathing is quite good and already noticeably better than it was prior to surgery. His appetite is been slow to improve. His sugars have been somewhat low in the afternoons and he has cut back his dosage of an sling appropriately. His wife thinks this is because  he is just not eating as much as he was prior to surgery. He is sleeping well at night. 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 Home health physical therapy has been working with him routinely. Overall he is quite pleased with his progress.   Current Outpatient Prescriptions  Medication Sig Dispense Refill  . acetaminophen (TYLENOL) 650 MG CR tablet Take 1,300 mg by mouth every 8 (eight) hours as needed for pain.    Marland Kitchen ALPRAZolam (XANAX) 0.5 MG tablet Take 0.5 mg by mouth at bedtime.    Marland Kitchen aspirin EC 325 MG EC tablet Take 1 tablet (325 mg total) by mouth daily. 30 tablet 0  . cholecalciferol (VITAMIN D) 1000 UNITS tablet Take 1,000 Units by mouth daily.    . furosemide (LASIX) 40 MG tablet TAKE 1 TAB DAILY AS NEEDED FOR SWELLING 90 tablet 0  . insulin NPH Human (HUMULIN N,NOVOLIN N) 100 UNIT/ML injection Inject 30 Units into the skin 3 (three) times daily.    . Liraglutide (VICTOZA) 18 MG/3ML SOPN Inject 1.2 mg into the skin daily.     Marland Kitchen losartan (COZAAR) 25 MG tablet Take 1 tablet (25 mg total) by mouth daily. 30 tablet 3  . meloxicam (MOBIC) 7.5 MG tablet Take 7.5 mg by mouth daily.    . metoprolol (LOPRESSOR) 50 MG tablet Take 1 tablet (50 mg total) by mouth 2 (two) times daily. (Patient taking differently: Take 25 mg by mouth 2 (  two) times daily. ) 60 tablet 1  . oxyCODONE (OXY IR/ROXICODONE) 5 MG immediate release tablet Take 1-2 tablets (5-10 mg total) by mouth every 3 (three) hours as needed for severe pain. 30 tablet 0  . potassium chloride SA (K-DUR,KLOR-CON) 20 MEQ tablet Take 1 tablet (20 mEq total) by mouth daily. 30 tablet 1  . sitaGLIPtin-metformin (JANUMET) 50-1000 MG tablet Take 1 tablet by mouth 2 (two) times daily with a meal.    . traMADol (ULTRAM) 50 MG tablet TAKE ONE TO TWO TABLETS BY MOUTH EVERY 4 HOURS AS NEEDED FOR MODERATE PAIN 40 tablet 0  . VITAMIN E PO Take 1 capsule by mouth daily.     No current facility-administered  medications for this visit.      Physical Exam:   BP 154/72 mmHg  Pulse 83  Resp 20  Ht 6\' 1"  (1.854 m)  Wt 254 lb (115.214 kg)  BMI 33.52 kg/m2  SpO2 95%  General:  Well-appearing  Chest:   Clear to auscultation  CV:   Regular rate and rhythm without murmur  Incisions:  Healing nicely, sternum is stable  Abdomen:  Soft and nontender  Extremities:  Warm and well-perfused. Palpable pulse in right wrist with normal home R circulation. Motor function is normal. Patient reports severe numbness involving the right thumb, right index finger, and right middle finger with some paresthesias in the middle finger.   Diagnostic Tests:  CHEST 2 VIEW  COMPARISON: 08/22/2015  FINDINGS: Prior median sternotomy. Thoracic spondylosis. Mild enlargement of the cardiopericardial silhouette, without overt edema.  Linear subsegmental atelectasis or scarring peripherally at the left lung base and along the left hemidiaphragm. Blunting of both posterior costophrenic angles suggesting trace bilateral pleural effusions.  Aortic valve prosthesis noted.  IMPRESSION: 1. Mild enlargement of the cardiopericardial silhouette, without overt edema. 2. Trace bilateral pleural effusions. 3. Peripheral linear opacities along the lingula favor subsegmental atelectasis or scarring, less likely to be Kerley B-lines. 4. Thoracic spondylosis.   Electronically Signed  By: Van Clines M.D.  On: 09/20/2015 10:49         Impression:  Patient is doing very well approximately one month status post aortic valve replacement using a bioprosthetic tissue valve with exception of the fact that he still has persistent numbness in the radial nerve distribution of the right hand. This all developed immediately following surgery in the setting of severe swelling and redness in the right forearm related to an infiltrated IV. 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   Plan:  I have discussed the nature of the patient's neurologic sequelae at length with the patient and his wife. We discussed the possibility of referring him to a neurologist and/or a hand surgeon, although at this point I am skeptical that this would lead to any change in therapy. The patient does not wish to proceed with further workup as symptoms seem to be slowly improving. We will request a formal occupational therapy consult so that the patient can be given some specific exercises and recommendations for his rehabilitation. Otherwise I have encouraged the patient to continue to gradually increase his physical activity as tolerated with his only limitation remaining that he refrain from heavy lifting or strenuous use of his arms or shoulders for at least another 2 months. He has asked about timing of knee replacement surgery. I would wait at least 2 or  3 months if not longer, as I suspect the patient would likely need to be using crutches for a period of time following total knee replacement. Once the patient has completed his course of home health physical therapy would strongly recommend that he enroll and participate in the outpatient cardiac rehabilitation program. His problems with his left knee will certainly affect his ability to perform some exercises, but uses a stationary bicycle should be feasible. I have also instructed the patient to increase his dose of losartan to 50 mg daily. He was taking 100 mg daily prior to surgery and is currently only taking 25 mg. I would not reduce his current dose of metoprolol given that his resting pulse remains in the 80s and 90s.  The patient will return for follow-up in 2 months. He will contact his primary care physician for instructions should he continue to have problems with his sugar bottoming out in the afternoons.  We have not recommended any other changes to his  current medications at this time.   Valentina Gu. Roxy Manns, MD 09/20/2015 11:10 AM

## 2015-09-30 ENCOUNTER — Encounter: Payer: Self-pay | Admitting: *Deleted

## 2015-10-15 ENCOUNTER — Other Ambulatory Visit: Payer: Self-pay | Admitting: *Deleted

## 2015-10-15 MED ORDER — METOPROLOL TARTRATE 50 MG PO TABS: 50.0000 mg | ORAL_TABLET | Freq: Two times a day (BID) | ORAL | Status: DC

## 2015-10-15 MED ORDER — LOSARTAN POTASSIUM 25 MG PO TABS: 50.0000 mg | ORAL_TABLET | Freq: Every day | ORAL | Status: DC

## 2015-10-15 MED ORDER — POTASSIUM CHLORIDE CRYS ER 20 MEQ PO TBCR: 20.0000 meq | EXTENDED_RELEASE_TABLET | Freq: Every day | ORAL | Status: DC

## 2015-10-15 NOTE — Telephone Encounter (Signed)
Pt requesting refills #[phone] to be sent to [facility]

## 2015-10-25 ENCOUNTER — Ambulatory Visit (INDEPENDENT_AMBULATORY_CARE_PROVIDER_SITE_OTHER): Payer: Medicare Other | Admitting: Cardiology

## 2015-10-25 ENCOUNTER — Encounter: Payer: Self-pay | Admitting: Cardiology

## 2015-10-25 ENCOUNTER — Ambulatory Visit: Payer: Medicare Other | Admitting: Cardiology

## 2015-10-25 VITALS — BP 101/66 | HR 80 | Ht 73.0 in | Wt 263.0 lb

## 2015-10-25 DIAGNOSIS — E785 Hyperlipidemia, unspecified: Secondary | ICD-10-CM | POA: Diagnosis not present

## 2015-10-25 DIAGNOSIS — Z954 Presence of other heart-valve replacement: Secondary | ICD-10-CM | POA: Diagnosis not present

## 2015-10-25 DIAGNOSIS — I350 Nonrheumatic aortic (valve) stenosis: Secondary | ICD-10-CM | POA: Diagnosis not present

## 2015-10-25 DIAGNOSIS — Z953 Presence of xenogenic heart valve: Secondary | ICD-10-CM

## 2015-10-25 DIAGNOSIS — I2510 Atherosclerotic heart disease of native coronary artery without angina pectoris: Secondary | ICD-10-CM

## 2015-10-25 MED ORDER — EZETIMIBE 10 MG PO TABS: 10.0000 mg | ORAL_TABLET | Freq: Every day | ORAL | Status: DC

## 2015-10-25 NOTE — Patient Instructions (Addendum)
   Begin Zetia 10mg  daily - new sent to [facility] today.  Continue all other medications.   Your physician wants you to follow up in: 6 months.  You will receive a reminder letter in the mail one-two months in advance.  If you don't receive a letter, please call our office to schedule the follow up appointment

## 2015-10-25 NOTE — Progress Notes (Signed)
Patient ID: Reg Uhland, male   DOB: [birth year], 70 y.o.   MRN: [id number]     Clinical Summary Mr. Moulin is a 70 y.o.male seen today for follow up of the following medical problems.   1. Aortic stenosis - s/p AVR 08/2015 with a 25 mm Edwards peridcardial tissue valve.  - denies any SOB or DOE since last visit.  -started cardiac rehab tomorrow in [place]  2. CAD - cath 07/2015 showed mild disease, he did have some 70-80% disease in small branches of the LCX that was managed medically.  - reports leg pains on statins, has not been on  - no recent chest pain  3. Arm/hand pain - started after recent AVR, treated for possible celluliits with keflex. Pain improving, he has completed keflex course - still with numbness in radial nerve distribution, potential irritation during recent surgery for AVR. Slowly improving.   Past Medical History  Diagnosis Date  . Diabetes mellitus ([place])   . Severe aortic stenosis 07/21/2015  . Type II diabetes mellitus (HCC)     Insulin-dependent  . Chronic diastolic congestive heart failure (HCC)     NYHA functional class II  . Obesity   . Obstructive sleep apnea     Intolerant of CPAP  . Varicose veins   . Cancer ([place])   . Heart murmur   . Asthma     as a child  . Arthritis   . 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 acetaminophen (TYLENOL) 650 MG CR tablet Take 1,300 mg by mouth every 8 (eight) hours as needed for pain.    Marland Kitchen ALPRAZolam (XANAX) 0.5 MG tablet Take 0.5 mg by mouth at bedtime.    Marland Kitchen aspirin EC 325 MG EC tablet Take 1 tablet (325 mg total) by mouth daily. 30 tablet 0  . cholecalciferol (VITAMIN D) 1000 UNITS tablet Take 1,000 Units by mouth daily.    . furosemide (LASIX) 40 MG tablet TAKE 1 TAB DAILY AS NEEDED FOR SWELLING 90  tablet 0  . insulin NPH Human (HUMULIN N,NOVOLIN N) 100 UNIT/ML injection Inject 30 Units into the skin 3 (three) times daily.    . Liraglutide (VICTOZA) 18 MG/3ML SOPN Inject 1.2 mg into the skin daily.     Marland Kitchen losartan (COZAAR) 25 MG tablet Take 2 tablets (50 mg total) by mouth daily. 180 tablet 3  . meloxicam (MOBIC) 7.5 MG tablet Take 7.5 mg by mouth daily.    . metoprolol (LOPRESSOR) 50 MG tablet Take 1 tablet (50 mg total) by mouth 2 (two) times daily. 180 tablet 3  . oxyCODONE (OXY IR/ROXICODONE) 5 MG immediate release tablet Take 1-2 tablets (5-10 mg total) by mouth every 3 (three) hours as needed for severe pain. 30 tablet 0  . potassium chloride SA (K-DUR,KLOR-CON) 20 MEQ tablet Take 1 tablet (20 mEq total) by mouth daily. 90 tablet 3  . sitaGLIPtin-metformin (JANUMET) 50-1000 MG tablet Take 1 tablet by mouth 2 (two) times daily with a meal.    . traMADol (ULTRAM) 50 MG tablet TAKE ONE TO TWO TABLETS BY MOUTH EVERY 4 HOURS AS NEEDED FOR MODERATE PAIN 40 tablet 0  . VITAMIN E PO Take 1 capsule by mouth daily.     No current facility-administered medications for this visit.     Past Surgical History  Procedure Laterality Date  . Polypectomy  03/21/2010  . Cervical spine surgery      30 years ago and last time 10 years ago  . Skin cancer excision  1991    removal of skin cancer to right cheek area  . Knee surgery Left 2013  . Shoulder surgery Left 2013  . Cardiac catheterization N/A 07/21/2015    Procedure: Right/Left Heart Cath and Coronary Angiography;  Surgeon: Sherren Mocha, MD;  Location: [facility];  Service: Cardiovascular;  Laterality: N/A;  . Colonoscopy    . Aortic valve replacement N/A 08/18/2015    Procedure: AORTIC VALVE REPLACEMENT (AVR);  Surgeon: [name], MD;  Location: [facility];  Service: Open Heart Surgery;  Laterality: N/A;  . Tee without cardioversion N/A 08/18/2015    Procedure: TRANSESOPHAGEAL ECHOCARDIOGRAM (TEE);  Surgeon: [name], MD;   Location: [facility];  Service: Open Heart Surgery;  Laterality: N/A;     No Known Allergies    Family History  Problem Relation Age of Onset  . Heart attack Mother   . Alcoholism Father   . Diabetes Brother   . Valvular heart disease Sister      Social History Mr. Towles reports that he quit smoking about 26 years ago. His smoking use included Cigarettes. He started smoking about 58 years ago. He has a 49.5 pack-year smoking history. He quit smokeless tobacco use about 46 years ago. Mr. Gionfriddo reports that he does not drink alcohol.   Review of Systems CONSTITUTIONAL: No weight loss, fever, chills, weakness or fatigue.  HEENT: Eyes: No visual loss, blurred vision, double vision or yellow sclerae.No hearing loss, sneezing, congestion, runny nose or sore throat.  SKIN: No rash or itching.  CARDIOVASCULAR: per HPI RESPIRATORY: No shortness of breath, cough or sputum.  GASTROINTESTINAL: No anorexia, nausea, vomiting or diarrhea. No abdominal pain or blood.  GENITOURINARY: +polyuria, dysuria NEUROLOGICAL: No headache, dizziness, syncope, paralysis, ataxia, numbness or tingling in the extremities. No change in bowel or bladder control.  MUSCULOSKELETAL: No muscle, back pain, joint pain or stiffness.  LYMPHATICS: No enlarged nodes. No history of splenectomy.  PSYCHIATRIC: No history of depression or anxiety.  ENDOCRINOLOGIC: No reports of sweating, cold or heat intolerance. No polyuria or polydipsia.  Marland Kitchen   Physical Examination Filed Vitals:   10/25/15 1542  BP: 101/66  Pulse: 80   Filed Vitals:   10/25/15 1542  Height: 6\' 1"  (1.854 m)  Weight: 263 lb (119.296 kg)    Gen: resting comfortably, no acute distress HEENT: no scleral icterus, pupils equal round and reactive, no palptable cervical adenopathy,  CV: RRR, no m/r/g, no jvd Resp: Clear to auscultation bilaterally GI: abdomen is soft, non-tender, non-distended, normal bowel sounds, no hepatosplenomegaly MSK: extremities  are warm, no edema.  Skin: warm, no rash Neuro:  no focal deficits Psych: appropriate affect   Diagnostic Studies  07/2015 echo Study Conclusions  - Left ventricle: The cavity size was normal. Wall thickness was increased in a pattern of severe LVH. Systolic function was normal. The estimated ejection fraction was in the range of 60% to 65%. Wall motion was normal; there were no regional wall motion abnormalities. Doppler parameters are consistent with abnormal left ventricular relaxation (grade 1 diastolic dysfunction). - Aortic valve: Severely calcified annulus. Trileaflet; severely thickened leaflets. There was severe stenosis. The contour of the spectral Doppler waveform may lead to underestimation of severity. Valve area (VTI): 1.01 cm^2. Valve area (Vmax): 0.96 cm^2. Valve area (Vmean): 0.79  cm^2. - Mitral valve: Mildly calcified annulus. Mildly thickened leaflets . - Left atrium: The atrium was mildly dilated. - Technically adequate study.   07/2015 Cath Dominance: Left   Left Main   . LM lesion, 30% stenosed.     Left Circumflex   . Mid Cx lesion, 25% stenosed.   . Second Obtuse Marginal Hindy Perrault   The vessel is small in size. There is moderate disease in the vessel.   . Third Obtuse Marginal Huriel Matt   The vessel is small in size. There is moderate disease in the vessel.   . First Left Posterolateral Abdulrahman Bracey   . 1st LPL lesion, 75% stenosed.   . Third Left Posterolateral Sreshta Cressler   . 3rd LPL lesion, 80% stenosed. Small vessel     Right Coronary Artery  Patent, nondominant RCA     Fick Cardiac Output  5.51 L/min    Fick Cardiac Output Index  2.29 (L/min)/BSA   RA A Wave  6 mmHg   RA V Wave  6 mmHg   RA Mean  4 mmHg   RV Systolic Pressure  34 mmHg   RV Diastolic Pressure  1 mmHg   RV EDP  8 mmHg   PA Systolic Pressure  32 mmHg   PA Diastolic  Pressure  10 mmHg   PA Mean  21 mmHg   PW A Wave  11 mmHg   PW V Wave  9 mmHg   PW Mean  8 mmHg   AO Systolic Pressure  Q000111Q mmHg   AO Diastolic Pressure  68 mmHg   AO Mean  97 mmHg   QP/QS  1   TPVR Index  9.19 HRUI   TSVR Index  42.44 HRUI   PVR SVR Ratio  0.14   TPVR/TSVR Ratio  0.22            Assessment and Plan   1. Severe aortic stenosis - s/p AVR, he is doing very well with his recovery and starting cardiac rehab tomorrow - continue activity limitations per CT surgery  2. CAD - no current symptoms - we will continue risk factor modificaiton and secondary prevention.   3. Dysuria - asked him to discuss with his pcp  4. Hyperlipidemia - did not tolerate statins, will try zetia 10mg  daily.    F/u 6 months Arnoldo Lenis, M.D.

## 2015-11-10 ENCOUNTER — Telehealth: Payer: Self-pay | Admitting: *Deleted

## 2015-11-10 MED ORDER — METOPROLOL TARTRATE 25 MG PO TABS: 25.0000 mg | ORAL_TABLET | Freq: Two times a day (BID) | ORAL | Status: DC

## 2015-11-10 NOTE — Telephone Encounter (Signed)
-----   Message from Arnoldo Lenis, MD sent at 11/10/2015  2:57 PM EST ----- Verify he is taking lopressor 50mg  bid and change to 25mg  bid   Zandra Abts MD ----- Message -----    From: Massie Maroon, CMA    Sent: 11/10/2015   1:17 PM      To: Arnoldo Lenis, MD  Pt says at therapy this morning it went down (he didn't know the reading).   Lejuan Botto ----- Message -----    From: Arnoldo Lenis, MD    Sent: 11/10/2015  12:50 PM      To: Shrihan Putt T Donelle Baba, CMA  If his bp's are still running low off of losartan, then we would need to cut back his lopressor. Has he had any more troubles with it?   Zandra Abts MD ----- Message -----    From: Massie Maroon, CMA    Sent: 11/10/2015   8:54 AM      To: Arnoldo Lenis, MD  Pt says he was actually taking losartan 50 mg daily, but Dr Chauncey Reading has stopped losartan completely as of 2 days ago.  Annah Jasko ----- Message -----    From: Arnoldo Lenis, MD    Sent: 11/09/2015   1:42 PM      To: Jedd Schulenburg T Epsie Walthall, CMA  Received message about low bp's during cardiac rehab. Confirm patient is taking losartan 25mg  daily and decrease to 12.5mg  daily  Zandra Abts MD

## 2015-11-10 NOTE — Telephone Encounter (Signed)
Verified pt had been taking metoprolol 50 mg bid. Pt aware to decrease to 25 mg bid. New rx sent to pharmacy. Pt voiced understanding

## 2015-11-22 ENCOUNTER — Other Ambulatory Visit: Payer: Self-pay | Admitting: *Deleted

## 2015-11-22 ENCOUNTER — Ambulatory Visit (INDEPENDENT_AMBULATORY_CARE_PROVIDER_SITE_OTHER): Payer: Medicare Other | Admitting: Thoracic Surgery (Cardiothoracic Vascular Surgery)

## 2015-11-22 ENCOUNTER — Encounter: Payer: Self-pay | Admitting: Thoracic Surgery (Cardiothoracic Vascular Surgery)

## 2015-11-22 VITALS — BP 128/72 | HR 84 | Resp 18 | Ht 73.0 in | Wt 283.0 lb

## 2015-11-22 DIAGNOSIS — Z954 Presence of other heart-valve replacement: Secondary | ICD-10-CM

## 2015-11-22 DIAGNOSIS — I350 Nonrheumatic aortic (valve) stenosis: Secondary | ICD-10-CM

## 2015-11-22 DIAGNOSIS — Z953 Presence of xenogenic heart valve: Secondary | ICD-10-CM

## 2015-11-22 DIAGNOSIS — S5421XD Injury of radial nerve at forearm level, right arm, subsequent encounter: Secondary | ICD-10-CM | POA: Diagnosis not present

## 2015-11-22 DIAGNOSIS — I359 Nonrheumatic aortic valve disorder, unspecified: Secondary | ICD-10-CM

## 2015-11-22 DIAGNOSIS — I2510 Atherosclerotic heart disease of native coronary artery without angina pectoris: Secondary | ICD-10-CM

## 2015-11-22 DIAGNOSIS — S5420XA Injury of radial nerve at forearm level, unspecified arm, initial encounter: Secondary | ICD-10-CM | POA: Insufficient documentation

## 2015-11-22 NOTE — Progress Notes (Signed)
[address]       [place] [address]             [phone]     CARDIOTHORACIC SURGERY OFFICE NOTE  Referring Provider is Arnoldo Lenis, MD PCP is [facility], DO   HPI:  Patient returns to the office today for routine follow-up status post aortic valve replacement using a rapid deployment bioprosthetic tissue valve on 08/18/2015 for stage D severe symptomatic aortic stenosis. Her early postoperative recovery was uncomplicated with the exception of the development of severe swelling and tenderness involving his right hand and forearm related to an infiltrated IV that was immediately overlying the right radial artery. At the time there were no associated signs of compromised circulation and the swelling rapidly improved. However, the patient's distal forearm and hand remain numb in the radial nerve distribution. He was treated with conservative measures for swelling and a seven-day course of oral Keflex for possible cellulitis.  He otherwise did quite well and was discharged from the hospital on the fifth postoperative day. He was last seen in follow-up in our office on 09/20/2015 at which time he was doing fairly well. However, at the time he still had persistent severe numbness in the distal radial nerve distribution in the right forearm and hand. Since then he has been taking part in the outpatient cardiac rehabilitation program. 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 Because of this his activity level has not improved much. He was taken off of losartan because of the development of mild hypotension without dizziness during exercise treatment at the cardiac rehabilitation program. He has been seen in follow-up by Dr. Harl Bowie most recently on 10/25/2015. No changes in his medications were made other than the fact that the patient was started on Zetia for hyperlipidemia with history of intolerance of  statins. He returns to our office for follow-up today. He states that overall he is doing fairly well although he remains concerned and limited with use of his right hand because of persistent numbness involving the thumb, index finger, and half of the middle finger. This has been bothering him a fair amount. He gets tired easily and he has not been walking much at all. He complains that he remains severely limited because of his left knee. He has been participating in outpatient cardiac rehabilitation program, but it sounds as though his efforts have been limited in his progress has been slow. He denies any symptoms of exertional shortness of breath. He has not had PND, orthopnea, or lower extremity edema.   Current Outpatient Prescriptions  Medication Sig Dispense Refill  . acetaminophen (TYLENOL) 650 MG CR tablet Take 1,300 mg by mouth every 8 (eight) hours as needed for pain.    Marland Kitchen aspirin 325 MG tablet Take 325 mg by mouth daily.    . cholecalciferol (VITAMIN D) 1000 UNITS tablet Take 1,000 Units by mouth daily.    . furosemide (LASIX) 40 MG tablet TAKE 1 TAB DAILY AS NEEDED FOR SWELLING 90 tablet 0  . insulin NPH Human (HUMULIN N,NOVOLIN N) 100 UNIT/ML injection Inject 30 Units into the skin 3 (three) times daily.    . Liraglutide (VICTOZA) 18 MG/3ML SOPN Inject 1.2 mg into the skin daily.     . metoprolol tartrate (LOPRESSOR) 25 MG tablet Take 1 tablet (25 mg total) by mouth 2 (two) times daily. 180 tablet 3  . potassium chloride SA (K-DUR,KLOR-CON) 20 MEQ tablet  Take 1 tablet (20 mEq total) by mouth daily. 90 tablet 3  . sitaGLIPtin-metformin (JANUMET) 50-1000 MG tablet Take 1 tablet by mouth 2 (two) times daily with a meal.    . traMADol (ULTRAM) 50 MG tablet TAKE ONE TO TWO TABLETS BY MOUTH EVERY 4 HOURS AS NEEDED FOR MODERATE PAIN 40 tablet 0  . VITAMIN E PO Take 1 capsule by mouth daily.    Marland Kitchen ALPRAZolam (XANAX) 0.5 MG tablet Take 0.5 mg by mouth at bedtime.    Marland Kitchen ezetimibe (ZETIA) 10 MG  tablet Take 1 tablet (10 mg total) by mouth daily. (Patient not taking: Reported on 11/22/2015) 30 tablet 6  . meloxicam (MOBIC) 7.5 MG tablet Take 7.5 mg by mouth daily.     No current facility-administered medications for this visit.      Physical Exam:   BP 128/72 mmHg  Pulse 84  Resp 18  Ht 6\' 1"  (1.854 m)  Wt 283 lb (128.368 kg)  BMI 37.35 kg/m2  SpO2 96%  General:  Obese below appearing  Chest:   Clear to auscultation  CV:   Regular rate and rhythm with soft systolic murmur  Incisions:  Completely healed, sternum is stable  Abdomen:  Soft and nontender  Extremities:  Warm and well perfused, no lower extremity edema  Diagnostic Tests:  n/a   Impression:  The patient is doing well from a cardiac standpoint proximally 3 months status post aortic valve replacement using a rapid deployment bioprosthetic tissue valve. His physical rehabilitation has been slow and limited significantly because of severe arthritis in his left knee. He may need to have knee replacement surgery, and at this point it would be reasonable to consider proceeding at any time. The patient has severe numbness in the distribution of the distal portion of the right radial nerve that developed immediately following surgery secondary to severe infiltration of an IV. Although this wellness and inflammation resolved quickly, nerve dysfunction persists more than 3 months later.    Plan:  I have encouraged the patient to continue to work on increasing his physical activity as tolerated. Given the problems with his left knee I suggested use of stationary bicycle and/or aqua aerobics in effort to get him more physically active. I think it would be reasonable to consider proceeding with elective knee replacement surgery at any point in time if indicated. We will refer the patient to neurology for nerve conduction studies to evaluate the suspected injury to the right radial nerve. We will also refer the patient to Dr.  Amedeo Plenty for evaluation and follow-up of the same problem. We have not recommended any changes to the patient's current medications. I have recommended this patient undergo routine follow-up echocardiogram to establish new baseline for left ventricular function and his recent aortic valve replacement. The patient will return for routine follow-up next December, approximately 1 year following his original surgery.  He will call and return sooner should specific problems or questions arise.  I spent in excess of 15 minutes during the conduct of this office consultation and >50% of this time involved direct face-to-face encounter with the patient for counseling and/or coordination of their care.    Valentina Gu. Roxy Manns, MD 11/22/2015 12:40 PM

## 2015-11-22 NOTE — Patient Instructions (Addendum)
Continue all previous medications without any changes at this time  Schedule routine follow up echocardiogram at Dr Nelly Laurence office  You may resume unrestricted physical activity without any particular limitations at this time.  Endocarditis is a potentially serious infection of heart valves or inside lining of the heart.  It occurs more commonly in patients with diseased heart valves (such as patient's with aortic or mitral valve disease) and in patients who have undergone heart valve repair or replacement.  Certain surgical and dental procedures may put you at risk, such as dental cleaning, other dental procedures, or any surgery involving the respiratory, urinary, gastrointestinal tract, gallbladder or prostate gland.   To minimize your chances for develooping endocarditis, maintain good oral health and seek prompt medical attention for any infections involving the mouth, teeth, gums, skin or urinary tract.    Always notify your doctor or dentist about your underlying heart valve condition before having any invasive procedures. You will need to take antibiotics before certain procedures, including all routine dental cleanings or other dental procedures.  Your cardiologist or dentist should prescribe these antibiotics for you to be taken ahead of time.

## 2015-11-24 ENCOUNTER — Ambulatory Visit (HOSPITAL_COMMUNITY)
Admission: RE | Admit: 2015-11-24 | Discharge: 2015-11-24 | Disposition: A | Discharge status: Home / Self Care | Payer: Medicare Other | Source: Ambulatory Visit | Attending: Thoracic Surgery (Cardiothoracic Vascular Surgery) | Admitting: Thoracic Surgery (Cardiothoracic Vascular Surgery)

## 2015-11-24 DIAGNOSIS — E119 Type 2 diabetes mellitus without complications: Secondary | ICD-10-CM | POA: Diagnosis not present

## 2015-11-24 DIAGNOSIS — I359 Nonrheumatic aortic valve disorder, unspecified: Secondary | ICD-10-CM | POA: Insufficient documentation

## 2015-11-24 DIAGNOSIS — Z953 Presence of xenogenic heart valve: Secondary | ICD-10-CM | POA: Diagnosis not present

## 2015-11-24 DIAGNOSIS — I509 Heart failure, unspecified: Secondary | ICD-10-CM | POA: Diagnosis not present

## 2015-11-25 ENCOUNTER — Other Ambulatory Visit (HOSPITAL_COMMUNITY): Payer: Medicare Other

## 2015-12-07 ENCOUNTER — Telehealth: Payer: Self-pay | Admitting: Diagnostic Neuroimaging

## 2015-12-07 ENCOUNTER — Ambulatory Visit: Payer: Self-pay | Admitting: Diagnostic Neuroimaging

## 2015-12-20 ENCOUNTER — Encounter: Payer: Self-pay | Admitting: Diagnostic Neuroimaging

## 2015-12-20 ENCOUNTER — Ambulatory Visit (INDEPENDENT_AMBULATORY_CARE_PROVIDER_SITE_OTHER): Payer: Medicare Other | Admitting: Diagnostic Neuroimaging

## 2015-12-20 VITALS — BP 126/79 | HR 85 | Ht 73.0 in | Wt 264.0 lb

## 2015-12-20 DIAGNOSIS — I2510 Atherosclerotic heart disease of native coronary artery without angina pectoris: Secondary | ICD-10-CM | POA: Diagnosis not present

## 2015-12-20 DIAGNOSIS — M79641 Pain in right hand: Secondary | ICD-10-CM | POA: Diagnosis not present

## 2015-12-20 NOTE — Patient Instructions (Signed)
Thank you for coming to see [place] at [facility]. I hope we have been able to provide you high quality care today.  You may receive a patient satisfaction survey over the next few weeks. We would appreciate your feedback and comments so that we may continue to improve ourselves and the health of our patients.  - I will check electrical nerve test (EMG/NCS)   ~~~~~~~~~~~~~~~~~~~~~~~~~~~~~~~~~~~~~~~~~~~~~~~~~~~~~~~~~~~~~~~~~  DR. PENUMALLI'S GUIDE TO HAPPY AND HEALTHY LIVING These are some of my general health and wellness recommendations. Some of them may apply to you better than others. Please use common sense as you try these suggestions and feel free to ask me any questions.   ACTIVITY/FITNESS Mental, social, emotional and physical stimulation are very important for brain and body health. Try learning a new activity (arts, music, language, sports, games).  Keep moving your body to the best of your abilities. You can do this at home, inside or outside, the park, community center, gym or anywhere you like. Consider a physical therapist or personal trainer to get started. Consider the app Sworkit. Fitness trackers such as smart-watches, smart-phones or Fitbits can help as well.   NUTRITION Eat more plants: colorful vegetables, nuts, seeds and berries.  Eat less sugar, salt, preservatives and processed foods.  Avoid toxins such as cigarettes and alcohol.  Drink water when you are thirsty. Warm water with a slice of lemon is an excellent morning drink to start the day.  Consider these websites for more information The Nutrition Source (https://www.henry-hernandez.biz/) Precision Nutrition (WindowBlog.ch)   RELAXATION Consider practicing mindfulness meditation or other relaxation techniques such as deep breathing, prayer, yoga, tai chi, massage. See website mindful.org or the apps Headspace or Calm to help get  started.   SLEEP Try to get at least 7-8+ hours sleep per day. Regular exercise and reduced caffeine will help you sleep better. Practice good sleep hygeine techniques. See website sleep.org for more information.   PLANNING Prepare estate planning, living will, healthcare POA documents. Sometimes this is best planned with the help of an attorney. Theconversationproject.org and agingwithdignity.org are excellent resources.

## 2015-12-20 NOTE — Progress Notes (Signed)
[facility]  PATIENT: Brent Cobb DOB: 09-Aug-1946  REFERRING CLINICIAN: Remus Loffler HISTORY FROM: patient and wife  REASON FOR VISIT: new consult    HISTORICAL  CHIEF COMPLAINT:  Chief Complaint  Patient presents with  . Possible right radial nerve injury    rm 6, New Pt, wife- Brent Cobb, "2 fingers/thumb partially numb R hand; had IV in RLE 08/18/15, this occured following surgery"    HISTORY OF PRESENT ILLNESS:   70 year old right-handed male here for evaluation of right hand numbness and pain since aortic valve replacement surgery in December 2016. Only surgery patient woke up with severe swelling and pain in his right hand. Apparently a right forearm IV site had infiltrated. Patient was treated with antibiotics for cellulitis. Over the next few weeks symptoms of swelling and pain slightly improved. Within 2-4 weeks swelling had totally resolved. Symptoms continued to improve until February 2017. Since that time symptoms have stabilized. He continues to have numbness, tingling, pain and weakness in his right hand. Digits 1-3 are mainly affected. He has some symptoms in the palm of his hand up to base of wrist. No significant symptoms proximal to the wrist. No significant neck pain, left arm problems, bilateral leg problems. No vision changes, headaches, slurred speech or trouble talking.  Patient has tried occupational therapy without significant relief. Patient also has diabetes, arthritis, history of pinched nerves in the neck, left knee and left shoulder surgeries.   REVIEW OF SYSTEMS: Full 14 system review of systems performed and negative with exception of: Numbness fatigue hearing loss.  ALLERGIES: No Known Allergies  HOME MEDICATIONS: Outpatient Prescriptions Prior to Visit  Medication Sig Dispense Refill  . acetaminophen (TYLENOL) 650 MG CR tablet Take 1,300 mg by mouth every 8 (eight) hours as needed for pain.    Marland Kitchen aspirin 325 MG tablet Take 325 mg by  mouth daily.    . cholecalciferol (VITAMIN D) 1000 UNITS tablet Take 1,000 Units by mouth daily.    . insulin NPH Human (HUMULIN N,NOVOLIN N) 100 UNIT/ML injection Inject 30 Units into the skin 3 (three) times daily.    . Liraglutide (VICTOZA) 18 MG/3ML SOPN Inject 1.2 mg into the skin daily.     . meloxicam (MOBIC) 7.5 MG tablet Take 7.5 mg by mouth daily.    . potassium chloride SA (K-DUR,KLOR-CON) 20 MEQ tablet Take 1 tablet (20 mEq total) by mouth daily. 90 tablet 3  . sitaGLIPtin-metformin (JANUMET) 50-1000 MG tablet Take 1 tablet by mouth 2 (two) times daily with a meal.    . traMADol (ULTRAM) 50 MG tablet TAKE ONE TO TWO TABLETS BY MOUTH EVERY 4 HOURS AS NEEDED FOR MODERATE PAIN 40 tablet 0  . VITAMIN E PO Take 1 capsule by mouth daily.    Marland Kitchen ALPRAZolam (XANAX) 0.5 MG tablet Take 0.5 mg by mouth at bedtime.    Marland Kitchen ezetimibe (ZETIA) 10 MG tablet Take 1 tablet (10 mg total) by mouth daily. (Patient not taking: Reported on 11/22/2015) 30 tablet 6  . furosemide (LASIX) 40 MG tablet TAKE 1 TAB DAILY AS NEEDED FOR SWELLING 90 tablet 0  . metoprolol tartrate (LOPRESSOR) 25 MG tablet Take 1 tablet (25 mg total) by mouth 2 (two) times daily. 180 tablet 3   No facility-administered medications prior to visit.    PAST MEDICAL HISTORY: Past Medical History  Diagnosis Date  . Diabetes mellitus ([place])   . Severe aortic stenosis 07/21/2015  . Type II diabetes mellitus ([place])  Insulin-dependent  . Chronic diastolic congestive heart failure (HCC)     NYHA functional class II  . Obesity   . Obstructive sleep apnea     Intolerant of CPAP  . Varicose veins   . Cancer ([place])   . Heart murmur   . Asthma     as a child  . Arthritis   . S/P aortic valve replacement with bioprosthetic valve 08/18/2015    25 mm Edwards Intuity Elite rapid-deployment bioprosthetic tissue valve  . Radial nerve injury     right    PAST SURGICAL HISTORY: Past Surgical History  Procedure Laterality Date  .  Polypectomy  03/21/2010  . Cervical spine surgery      30 years ago and last time 10 years ago  . Skin cancer excision  1991    removal of skin cancer to right cheek area  . Knee surgery Left 2013  . Shoulder surgery Left 2013  . Cardiac catheterization N/A 07/21/2015    Procedure: Right/Left Heart Cath and Coronary Angiography;  Surgeon: Sherren Mocha, MD;  Location: [facility];  Service: Cardiovascular;  Laterality: N/A;  . Colonoscopy    . Aortic valve replacement N/A 08/18/2015    Procedure: AORTIC VALVE REPLACEMENT (AVR);  Surgeon: [name], MD;  Location: [facility];  Service: Open Heart Surgery;  Laterality: N/A;  . Tee without cardioversion N/A 08/18/2015    Procedure: TRANSESOPHAGEAL ECHOCARDIOGRAM (TEE);  Surgeon: [name], MD;  Location: [facility];  Service: Open Heart Surgery;  Laterality: N/A;    FAMILY HISTORY: Family History  Problem Relation Age of Onset  . Heart attack Mother   . Alcoholism Father   . Diabetes Brother   . Valvular heart disease Sister     SOCIAL HISTORY:  Social History   Social History  . Marital Status: Married    Spouse Name: Brent Cobb  . Number of Children: 3  . Years of Education: GED   Occupational History  .      retired from [employer]  . Smoking status: Former Smoker -- 1.50 packs/day for 33 years    Types: Cigarettes    Start date: 06/21/1957    Quit date: 09/04/1989  . Smokeless tobacco: Former Systems developer    Quit date: 09/04/1969  . Alcohol Use: No  . Drug Use: No  . Sexual Activity: Not on file   Other Topics Concern  . Not on file   Social History Narrative   Lives at home with wife; sodas 2 a day   Caffeine use- coffee 2 large  cups daily     PHYSICAL EXAM  GENERAL EXAM/CONSTITUTIONAL: Vitals:  Filed Vitals:   12/20/15 1318  BP: 126/79  Pulse: 85  Height: 6\' 1"  (1.854 m)  Weight: 264 lb (119.75 kg)     Body mass index is 34.84 kg/(m^2).  Visual Acuity  Screening   Right eye Left eye Both eyes  Without correction: 20/40 20/70   With correction:        Patient is in no distress; well developed, nourished and groomed; neck is supple  CARDIOVASCULAR:  Examination of carotid arteries is normal; no carotid bruits  Regular rate and rhythm, no murmurs  Examination of peripheral vascular system by observation and palpation is normal  EYES:  Ophthalmoscopic exam of optic discs and posterior segments is normal; no papilledema or hemorrhages  MUSCULOSKELETAL:  Gait, strength, tone, movements noted in Neurologic exam below  NEUROLOGIC:  MENTAL STATUS:  No flowsheet data found.  awake, alert, oriented to person, place and time  recent and remote memory intact  normal attention and concentration  language fluent, comprehension intact, naming intact,   fund of knowledge appropriate  CRANIAL NERVE:   2nd - no papilledema on fundoscopic exam  2nd, 3rd, 4th, 6th - pupils equal and reactive to light, visual fields full to confrontation, extraocular muscles intact, no nystagmus  5th - facial sensation symmetric  7th - facial strength symmetric  8th - hearing intact  9th - palate elevates symmetrically, uvula midline  11th - shoulder shrug symmetric  12th - tongue protrusion midline  MOTOR:   normal bulk and tone, full strength in the BUE, BLE  EXCEPT ATROPHY AND WEAKNESS OF RIGHT APB  SENSORY:   normal and symmetric to light touch, temperature, vibration  DECR PP IN RIGHT HAND DIGITS 1-3  POSITIVE PHALENS AND TINELS ON RIGHT HAND  COORDINATION:   finger-nose-finger, fine finger movements normal  REFLEXES:   deep tendon reflexes TRACE and symmetric  GAIT/STATION:   narrow based gait; USES SINGLE POINT CANE    DIAGNOSTIC DATA (LABS, IMAGING, TESTING) - I reviewed patient records, labs, notes, testing and imaging myself where available.  Lab Results  Component Value Date   WBC 10.5 08/21/2015   HGB  10.6* 08/21/2015   HCT 33.0* 08/21/2015   MCV 90.7 08/21/2015   PLT 161 08/21/2015      Component Value Date/Time   NA 135 08/21/2015 0408   K 4.2 08/21/2015 0408   CL 100* 08/21/2015 0408   CO2 31 08/21/2015 0408   GLUCOSE 237* 08/21/2015 0408   BUN 20 08/21/2015 0408   CREATININE 1.16 08/21/2015 0408   CALCIUM 8.2* 08/21/2015 0408   PROT 7.0 08/16/2015 0944   ALBUMIN 4.0 08/16/2015 0944   AST 19 08/16/2015 0944   ALT 14* 08/16/2015 0944   ALKPHOS 139* 08/16/2015 0944   BILITOT 0.4 08/16/2015 0944   GFRNONAA >60 08/21/2015 0408   GFRAA >60 08/21/2015 0408   No results found for: CHOL, HDL, LDLCALC, LDLDIRECT, TRIG, CHOLHDL Lab Results  Component Value Date   HGBA1C 7.9* 08/16/2015   No results found for: VITAMINB12 No results found for: TSH   09/20/15 CXR [I reviewed images myself and agree with interpretation. -VRP]  1. Mild enlargement of the cardiopericardial silhouette, without overt edema. 2. Trace bilateral pleural effusions. 3. Peripheral linear opacities along the lingula favor subsegmental atelectasis or scarring, less likely to be Kerley B-lines. 4. Thoracic spondylosis.  11/24/15 TTE  - Left ventricle: The cavity size was normal. Wall thickness was increased in a pattern of moderate LVH. Systolic function was normal. The estimated ejection fraction was in the range of 60% to 65%. Wall motion was normal; there were no regional wall motion abnormalities. Doppler parameters are consistent with abnormal left ventricular relaxation (grade 1 diastolic dysfunction). - Aortic valve: Valve area (VTI): 3.49 cm^2. Valve area (Vmax): 3.29 cm^2. Valve area (Vmean): 3.15 cm^2. - Technically adequate study.     ASSESSMENT AND PLAN  70 y.o. 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 Symptoms occurred following cardiothoracic surgery, aortic valve replacement, on 08/18/15. Patient was found  to have infiltrated IV following surgery with severe swelling and pain in the entire right hand. Swelling and infection may have caused compression of the median nerve. Will check EMG nerve conduction study to confirm diagnosis.  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 Right hand pain     PLAN: - I will check EMG testing - then follow up with Dr. Amedeo Plenty   Orders Placed This Encounter  Procedures  . NCV with EMG(electromyography)   Return for for NCV/EMG.  I reviewed images, labs, notes, records myself. I summarized findings and reviewed with patient, for this moderate risk condition (right hand pain, weakness, numbness) requiring high complexity decision making.     Penni Bombard, MD [id number], [id number] PM Certified in Neurology, Neurophysiology and Neuroimaging  [facility] [address] [place], [place] [address] [phone]

## 2015-12-21 ENCOUNTER — Telehealth: Payer: Self-pay | Admitting: Diagnostic Neuroimaging

## 2015-12-21 NOTE — Telephone Encounter (Signed)
Wife called to get CPT codes for husband's upcoming NCS/EMG, advised NCS CPT codes are [phone] and could be any one of these codes, won't know until test is done. EMG codes are 95885, [id number], [phone], will be one of these codes, won't know which one until test is done.

## 2015-12-21 NOTE — Telephone Encounter (Signed)
Pt's wife called in requesting procedure code for nvc/emg, insurance was requesting it. Pt needed to find out if it would be covered. She was given M79.641

## 2015-12-30 ENCOUNTER — Encounter (INDEPENDENT_AMBULATORY_CARE_PROVIDER_SITE_OTHER): Payer: Self-pay | Admitting: Diagnostic Neuroimaging

## 2015-12-30 ENCOUNTER — Ambulatory Visit (INDEPENDENT_AMBULATORY_CARE_PROVIDER_SITE_OTHER): Payer: Medicare Other | Admitting: Diagnostic Neuroimaging

## 2015-12-30 DIAGNOSIS — Z0289 Encounter for other administrative examinations: Secondary | ICD-10-CM

## 2015-12-30 DIAGNOSIS — M79641 Pain in right hand: Secondary | ICD-10-CM | POA: Diagnosis not present

## 2015-12-30 NOTE — Procedures (Signed)
   [facility]  NCS (NERVE CONDUCTION STUDY) WITH EMG (ELECTROMYOGRAPHY) REPORT   STUDY DATE: 12/30/15 PATIENT NAME: Brent Cobb DOB: 1946-02-08 MRN: [id number]  ORDERING CLINICIAN: Andrey Spearman, MD   TECHNOLOGIST: Laretta Alstrom  ELECTROMYOGRAPHER: Earlean Polka. Penumalli, MD  CLINICAL INFORMATION: 70 year old male with right hand weakness and numbness following right hand IV infiltration and cellulitis.  FINDINGS: NERVE CONDUCTION STUDY: Right median motor response has prolonged distal latency 6.2 ms, decreased amplitude, normal conduction velocity and prolonged F-wave latency. Left median and bilateral ulnar motor responses and F wave latencies are normal.  Right median sensory response could not be obtained. Left median and bilateral ulnar sensory responses are normal.   NEEDLE ELECTROMYOGRAPHY: Needle examination of right upper extremity deltoid, biceps, triceps, flexor carpi radialis, first dorsal interosseous is normal.   IMPRESSION:  Abnormal study demonstrating: - Distal right median neuropathy below the branch to the right flexor carpi radialis. Right median sensory response could not be obtained. Right median motor response is moderate to severely affected. - Left median and bilateral ulnar sensory and motor responses are normal.    INTERPRETING PHYSICIAN:  Penni Bombard, MD Certified in Neurology, Neurophysiology and Neuroimaging  [facility] [address] [place], [place] [address] [phone]

## 2016-04-21 ENCOUNTER — Encounter: Payer: Self-pay | Admitting: *Deleted

## 2016-04-24 ENCOUNTER — Ambulatory Visit (INDEPENDENT_AMBULATORY_CARE_PROVIDER_SITE_OTHER): Payer: Medicare Other | Admitting: Cardiology

## 2016-04-24 ENCOUNTER — Encounter: Payer: Self-pay | Admitting: Cardiology

## 2016-04-24 VITALS — BP 131/79 | HR 76 | Ht 73.0 in | Wt 271.0 lb

## 2016-04-24 DIAGNOSIS — Z01810 Encounter for preprocedural cardiovascular examination: Secondary | ICD-10-CM

## 2016-04-24 DIAGNOSIS — Z954 Presence of other heart-valve replacement: Secondary | ICD-10-CM

## 2016-04-24 DIAGNOSIS — I2510 Atherosclerotic heart disease of native coronary artery without angina pectoris: Secondary | ICD-10-CM | POA: Diagnosis not present

## 2016-04-24 DIAGNOSIS — E785 Hyperlipidemia, unspecified: Secondary | ICD-10-CM | POA: Diagnosis not present

## 2016-04-24 DIAGNOSIS — Z953 Presence of xenogenic heart valve: Secondary | ICD-10-CM

## 2016-04-24 MED ORDER — METOPROLOL TARTRATE 25 MG PO TABS: 25.0000 mg | ORAL_TABLET | Freq: Two times a day (BID) | ORAL | Status: DC

## 2016-04-24 MED ORDER — EZETIMIBE 10 MG PO TABS: 10.0000 mg | ORAL_TABLET | Freq: Every day | ORAL | 3 refills | Status: DC

## 2016-04-24 NOTE — Patient Instructions (Signed)
Medication Instructions:   Zetia 10mg  daily - printed script given.  Metoprolol tart 25mg  twice a day added to medication list.   Continue all other medications.    Labwork: none  Testing/Procedures: none  Follow-Up: Your physician wants you to follow up in:  1 year.  You will receive a reminder letter in the mail one-two months in advance.  If you don't receive a letter, please call our office to schedule the follow up appointment   Any Other Special Instructions Will Be Listed Below (If Applicable).  If you need a refill on your cardiac medications before your next appointment, please call your pharmacy.

## 2016-04-24 NOTE — Progress Notes (Signed)
Clinical Summary Brent Cobb is a 70 y.o.male seen today for follow up of the following medical problems.   1. Aortic stenosis - s/p AVR 08/2015 with a 25 mm Edwards peridcardial tissue valve.  - no recent SOB/DOE, no chest pain. Completed rehab at [facility]   2. CAD - cath 07/2015 showed mild disease, he did have some 70-80% disease in small branches of the LCX that was managed medically.  - reports leg pains on statins, has not been on  - losartan stopped due to low bp's during rehab.  - no recent chest pain   3. Hyperlipidemia - did not tolerate statins, tried on zetia but his insurance would not cover - Jan 2017 TC 118 TG 125 HDL 35 LDL 121  4. Preop evaluation - upcoming knee replacement.  - tolerating >4METs. Walks up flights of stairs without troubles on home.  Past Medical History:  Diagnosis Date  . Arthritis   . Asthma    as a child  . Cancer ([place])   . Chronic diastolic congestive heart failure (HCC)    NYHA functional class II  . Diabetes mellitus ([place])   . Heart murmur   . Obesity   . Obstructive sleep apnea    Intolerant of CPAP  . Radial nerve injury    right  . 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 Severe aortic stenosis 07/21/2015  . Type II diabetes mellitus (HCC)    Insulin-dependent  . Varicose veins      No Known Allergies   Current Outpatient Prescriptions  Medication Sig Dispense Refill  . acetaminophen (TYLENOL) 650 MG CR tablet Take 1,300 mg by mouth every 8 (eight) hours as needed for pain.    Marland Kitchen aspirin 325 MG tablet Take 325 mg by mouth daily.    . cholecalciferol (VITAMIN D) 1000 UNITS tablet Take 1,000 Units by mouth daily.    . insulin NPH Human (HUMULIN N,NOVOLIN N) 100 UNIT/ML injection Inject 30 Units into the skin 3 (three) times daily.    . Liraglutide (VICTOZA) 18 MG/3ML SOPN Inject 1.2 mg into the skin daily.      . meloxicam (MOBIC) 7.5 MG tablet Take 7.5 mg by mouth daily.    . potassium chloride SA (K-DUR,KLOR-CON) 20 MEQ tablet Take 1 tablet (20 mEq total) by mouth daily. 90 tablet 3  . sitaGLIPtin-metformin (JANUMET) 50-1000 MG tablet Take 1 tablet by mouth 2 (two) times daily with a meal.    . tamsulosin (FLOMAX) 0.4 MG CAPS capsule     . traMADol (ULTRAM) 50 MG tablet TAKE ONE TO TWO TABLETS BY MOUTH EVERY 4 HOURS AS NEEDED FOR MODERATE PAIN 40 tablet 0  . VITAMIN E PO Take 1 capsule by mouth daily.     No current facility-administered medications for this visit.      Past Surgical History:  Procedure Laterality Date  . AORTIC VALVE REPLACEMENT N/A 08/18/2015   Procedure: AORTIC VALVE REPLACEMENT (AVR);  Surgeon: [name], MD;  Location: [place];  Service: Open Heart Surgery;  Laterality: N/A;  . CARDIAC CATHETERIZATION N/A 07/21/2015   Procedure: Right/Left Heart Cath and Coronary Angiography;  Surgeon: Sherren Mocha, MD;  Location: [place] [facility];  Service: Cardiovascular;  Laterality: N/A;  . CERVICAL SPINE SURGERY     30 years ago and last time 10 years ago  . COLONOSCOPY    . KNEE SURGERY  Left 2013  . POLYPECTOMY  03/21/2010  . SHOULDER SURGERY Left 2013  . SKIN CANCER EXCISION  1991   removal of skin cancer to right cheek area  . TEE WITHOUT CARDIOVERSION N/A 08/18/2015   Procedure: TRANSESOPHAGEAL ECHOCARDIOGRAM (TEE);  Surgeon: [name], MD;  Location: [place];  Service: Open Heart Surgery;  Laterality: N/A;     No Known Allergies    Family History  Problem Relation Age of Onset  . Heart attack Mother   . Alcoholism Father   . Valvular heart disease Sister   . Diabetes Brother      Social History Brent Cobb reports that he quit smoking about 26 years ago. His smoking use included Cigarettes. He started smoking about 58 years ago. He has a 49.50 pack-year smoking history. He quit smokeless tobacco use about 46 years ago. Brent Cobb reports that he does  not drink alcohol.   Review of Systems CONSTITUTIONAL: No weight loss, fever, chills, weakness or fatigue.  HEENT: Eyes: No visual loss, blurred vision, double vision or yellow sclerae.No hearing loss, sneezing, congestion, runny nose or sore throat.  SKIN: No rash or itching.  CARDIOVASCULAR: per HPI RESPIRATORY: No shortness of breath, cough or sputum.  GASTROINTESTINAL: No anorexia, nausea, vomiting or diarrhea. No abdominal pain or blood.  GENITOURINARY: No burning on urination, no polyuria NEUROLOGICAL: No headache, dizziness, syncope, paralysis, ataxia, numbness or tingling in the extremities. No change in bowel or bladder control.  MUSCULOSKELETAL: No muscle, back pain, joint pain or stiffness.  LYMPHATICS: No enlarged nodes. No history of splenectomy.  PSYCHIATRIC: No history of depression or anxiety.  ENDOCRINOLOGIC: No reports of sweating, cold or heat intolerance. No polyuria or polydipsia.  Marland Kitchen   Physical Examination Vitals:   04/24/16 1307  BP: 131/79  Pulse: 76   Vitals:   04/24/16 1307  Weight: 271 lb (122.9 kg)  Height: 6\' 1"  (1.854 m)    Gen: resting comfortably, no acute distress HEENT: no scleral icterus, pupils equal round and reactive, no palptable cervical adenopathy,  CV: RRR, no m/r/g, no jvd Resp: Clear to auscultation bilaterally GI: abdomen is soft, non-tender, non-distended, normal bowel sounds, no hepatosplenomegaly MSK: extremities are warm, no edema.  Skin: warm, no rash Neuro:  no focal deficits Psych: appropriate affect   Diagnostic Studies 07/2015 echo Study Conclusions  - Left ventricle: The cavity size was normal. Wall thickness was increased in a pattern of severe LVH. Systolic function was normal. The estimated ejection fraction was in the range of 60% to 65%. Wall motion was normal; there were no regional wall motion abnormalities. Doppler parameters are consistent with abnormal left ventricular relaxation (grade 1  diastolic dysfunction). - Aortic valve: Severely calcified annulus. Trileaflet; severely thickened leaflets. There was severe stenosis. The contour of the spectral Doppler waveform may lead to underestimation of severity. Valve area (VTI): 1.01 cm^2. Valve area (Vmax): 0.96 cm^2. Valve area (Vmean): 0.79 cm^2. - Mitral valve: Mildly calcified annulus. Mildly thickened leaflets . - Left atrium: The atrium was mildly dilated. - Technically adequate study.   07/2015 Cath    Dominance: Left       Left Main   . LM lesion, 30% stenosed.          Left Circumflex   . Mid Cx lesion, 25% stenosed.   . Second Obtuse Marginal Dashonna Chagnon   The vessel is small in size. There is moderate disease in the vessel.   . Third Obtuse Marginal Jaleyah Longhi   The  vessel is small in size. There is moderate disease in the vessel.   . First Left Posterolateral Jaysa Kise   . 1st LPL lesion, 75% stenosed.   . Third Left Posterolateral Marleni Gallardo   . 3rd LPL lesion, 80% stenosed. Small vessel     Right Coronary Artery  Patent, nondominant RCA     Fick Cardiac Output  5.51 L/min    Fick Cardiac Output Index  2.29 (L/min)/BSA   RA A Wave  6 mmHg   RA V Wave  6 mmHg   RA Mean  4 mmHg   RV Systolic Pressure  34 mmHg   RV Diastolic Pressure  1 mmHg   RV EDP  8 mmHg   PA Systolic Pressure  32 mmHg   PA Diastolic Pressure  10 mmHg   PA Mean  21 mmHg   PW A Wave  11 mmHg   PW V Wave  9 mmHg   PW Mean  8 mmHg   AO Systolic Pressure  Q000111Q mmHg   AO Diastolic Pressure  68 mmHg   AO Mean  97 mmHg   QP/QS  1   TPVR Index  9.19 HRUI   TSVR Index  42.44 HRUI   PVR SVR Ratio  0.14   TPVR/TSVR Ratio  0.22       Assessment and Plan  1. Severe aortic stenosis - s/p AVR, he is doing very well - continue to monitor  2. CAD - no current symptoms -  continue current meds  3. Hyperlipidemia - intolerant to statins. Insurance did not cover zetia, though wife is asking for a written Rx for zetia to try through a med assistance program she is familiar with.   4. Preop evaluation - being considered for knee surgery. There is no contraindication from a cardiac stanpdoint. Tolerates greater than 4METs regualrly without difficulty.      F/u 1 year   Arnoldo Lenis, M.D.

## 2016-05-03 ENCOUNTER — Encounter: Payer: Self-pay | Admitting: Physician Assistant

## 2016-05-03 DIAGNOSIS — M1712 Unilateral primary osteoarthritis, left knee: Secondary | ICD-10-CM | POA: Diagnosis present

## 2016-05-03 NOTE — H&P (Signed)
TOTAL KNEE ADMISSION H&P  Patient is being admitted for left total knee arthroplasty.  Subjective:  Chief Complaint:left knee pain.  HPI: Brent Cobb, 70 y.o. male, has a history of pain and functional disability in the left knee due to arthritis and has failed non-surgical conservative treatments for greater than 12 weeks to includeNSAID's and/or analgesics, corticosteriod injections, viscosupplementation injections, flexibility and strengthening excercises, supervised PT with diminished ADL's post treatment, use of assistive devices and activity modification.  Onset of symptoms was gradual, starting 10 years ago with gradually worsening course since that time. The patient noted prior procedures on the knee to include  arthroscopy and menisectomy on the left knee(s).  Patient currently rates pain in the left knee(s) at 10 out of 10 with activity. Patient has night pain, worsening of pain with activity and weight bearing, pain that interferes with activities of daily living, crepitus and joint swelling.  Patient has evidence of subchondral sclerosis, periarticular osteophytes and joint space narrowing by imaging studies. . There is no active infection.  Patient Active Problem List   Diagnosis Date Noted  . Primary localized osteoarthritis of left knee   . Radial nerve injury   . S/P aortic valve replacement with bioprosthetic valve 08/18/2015  . Type II diabetes mellitus ([place])   . Chronic diastolic congestive heart failure ([place])   . Obesity   . Degenerative arthritis of left knee   . Obstructive sleep apnea   . Varicose veins   . Severe aortic stenosis 07/21/2015   Past Medical History:  Diagnosis Date  . Arthritis   . Asthma    as a child  . Cancer ([place])   . Chronic diastolic congestive heart failure (HCC)    NYHA functional class II  . Diabetes mellitus ([place])   . Heart murmur   . Obesity   . Obstructive sleep apnea    Intolerant of CPAP  . Primary localized osteoarthritis of  left knee   . Radial nerve injury    right  . 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 Severe aortic stenosis 07/21/2015  . Type II diabetes mellitus (HCC)    Insulin-dependent  . Varicose veins     Past Surgical History:  Procedure Laterality Date  . AORTIC VALVE REPLACEMENT N/A 08/18/2015   Procedure: AORTIC VALVE REPLACEMENT (AVR);  Surgeon: [name], MD;  Location: [place];  Service: Open Heart Surgery;  Laterality: N/A;  . CARDIAC CATHETERIZATION N/A 07/21/2015   Procedure: Right/Left Heart Cath and Coronary Angiography;  Surgeon: Sherren Mocha, MD;  Location: [place] [facility];  Service: Cardiovascular;  Laterality: N/A;  . CERVICAL SPINE SURGERY     30 years ago and last time 10 years ago  . COLONOSCOPY    . KNEE SURGERY Left 2013  . POLYPECTOMY  03/21/2010  . SHOULDER SURGERY Left 2013  . SKIN CANCER EXCISION  1991   removal of skin cancer to right cheek area  . TEE WITHOUT CARDIOVERSION N/A 08/18/2015   Procedure: TRANSESOPHAGEAL ECHOCARDIOGRAM (TEE);  Surgeon: [name], MD;  Location: [facility] [place];  Service: Open Heart Surgery;  Laterality: N/A;    No current facility-administered medications for this encounter.   Current Outpatient Prescriptions:  .  acetaminophen (TYLENOL) 650 MG CR tablet, Take 1,300 mg by mouth every 8 (eight) hours as needed for pain., Disp: , Rfl:  .  aspirin 325 MG tablet, Take 325 mg by  mouth daily., Disp: , Rfl:  .  cholecalciferol (VITAMIN D) 1000 UNITS tablet, Take 1,000 Units by mouth daily., Disp: , Rfl:  .  ezetimibe (ZETIA) 10 MG tablet, Take 1 tablet (10 mg total) by mouth daily., Disp: 90 tablet, Rfl: 3 .  insulin NPH Human (HUMULIN N,NOVOLIN N) 100 UNIT/ML injection, Inject 30 Units into the skin 3 (three) times daily., Disp: , Rfl:  .  Liraglutide (VICTOZA) 18 MG/3ML SOPN, Inject 1.2 mg into the skin daily. , Disp: , Rfl:  .   meloxicam (MOBIC) 7.5 MG tablet, Take 7.5 mg by mouth daily., Disp: , Rfl:  .  metoprolol tartrate (LOPRESSOR) 25 MG tablet, Take 1 tablet (25 mg total) by mouth 2 (two) times daily., Disp: , Rfl:  .  potassium chloride SA (K-DUR,KLOR-CON) 20 MEQ tablet, Take 1 tablet (20 mEq total) by mouth daily., Disp: 90 tablet, Rfl: 3 .  sitaGLIPtin-metformin (JANUMET) 50-1000 MG tablet, Take 1 tablet by mouth 2 (two) times daily with a meal., Disp: , Rfl:  .  tamsulosin (FLOMAX) 0.4 MG CAPS capsule, Take 0.4 mg by mouth daily. , Disp: , Rfl:  .  traMADol (ULTRAM) 50 MG tablet, TAKE ONE TO TWO TABLETS BY MOUTH EVERY 4 HOURS AS NEEDED FOR MODERATE PAIN, Disp: 40 tablet, Rfl: 0 .  VITAMIN E PO, Take 1 capsule by mouth daily., Disp: , Rfl:    No Known Allergies  Social History  Substance Use Topics  . Smoking status: Former Smoker    Packs/day: 1.50    Years: 33.00    Types: Cigarettes    Start date: 06/21/1957    Quit date: 09/04/1989  . Smokeless tobacco: Former Systems developer    Quit date: 09/04/1969  . Alcohol use No    Family History  Problem Relation Age of Onset  . Heart attack Mother   . Alcoholism Father   . Valvular heart disease Sister   . Diabetes Brother      Review of Systems  Constitutional: Negative.   HENT: Negative.   Eyes: Negative.   Respiratory: Negative.   Cardiovascular: Negative.   Gastrointestinal: Negative.   Genitourinary: Negative.   Musculoskeletal: Positive for back pain, joint pain and neck pain.  Skin: Negative.     Objective:  Physical Exam  Constitutional: He is oriented to person, place, and time. He appears well-developed and well-nourished.  HENT:  Head: Normocephalic and atraumatic.  Eyes: Conjunctivae are normal. Pupils are equal, round, and reactive to light.  Neck: Neck supple.  Cardiovascular: Normal rate and regular rhythm.   Respiratory: Effort normal and breath sounds normal.  GI: Soft.  Genitourinary:  Genitourinary Comments: Not pertinent to  current symptomatology therefore not examined.  Musculoskeletal:  Examination of his left knee reveals pain medially and laterally.  1+ crepitation.  1+ synovitis.  Range of motion -5 to 125 degrees.  Knee is stable with normal patella tracking.  Examination of the right knee reveals full range of motion without pain, swelling, weakness or instability.   Neurological: He is alert and oriented to person, place, and time.  Skin: Skin is dry.  Psychiatric: He has a normal mood and affect. His behavior is normal.    Vital signs in last 24 hours: Temp:  [97.8 F (36.6 C)] 97.8 F (36.6 C) (08/30 0900) Pulse Rate:  [85] 85 (08/30 0900) BP: (130)/(69) 130/69 (08/30 0900) SpO2:  [95 %] 95 % (08/30 0900) Weight:  [121.6 kg (268 lb)] 121.6 kg (268 lb) (08/30 0900)  Labs:   Estimated body mass index is 38.45 kg/m as calculated from the following:   Height as of this encounter: 5\' 10"  (1.778 m).   Weight as of this encounter: 121.6 kg (268 lb).   Imaging Review Plain radiographs demonstrate severe degenerative joint disease of the left knee(s). The overall alignment issignificant varus. The bone quality appears to be good for age and reported activity level.  Assessment/Plan:  End stage arthritis, left knee Active Problems:   Type II diabetes mellitus (HCC)   Chronic diastolic congestive heart failure (HCC)   Obstructive sleep apnea   S/P aortic valve replacement with bioprosthetic valve   Primary localized osteoarthritis of left knee    The patient history, physical examination, clinical judgment of the provider and imaging studies are consistent with end stage degenerative joint disease of the left knee(s) and total knee arthroplasty is deemed medically necessary. The treatment options including medical management, injection therapy arthroscopy and arthroplasty were discussed at length. The risks and benefits of total knee arthroplasty were presented and reviewed. The risks due to  aseptic loosening, infection, stiffness, patella tracking problems, thromboembolic complications and other imponderables were discussed. The patient acknowledged the explanation, agreed to proceed with the plan and consent was signed. Patient is being admitted for inpatient treatment for surgery, pain control, PT, OT, prophylactic antibiotics, VTE prophylaxis, progressive ambulation and ADL's and discharge planning. The patient is planning to be discharged home with home health services

## 2016-05-09 NOTE — Pre-Procedure Instructions (Signed)
[name]  05/09/2016      [facility] - [place], [place] [address] Phone: [phone] Fax: [phone]  [place] [address] [place] - [place] [address] Phone: [phone] Fax: [phone]    Your procedure is scheduled on Monday, September 18.  Report to [facility] Admitting at 7:55 A.M.  Call this number if you have problems the morning of surgery:  [phone]   Remember:  Do not eat food or drink liquids after midnight.  Take these medicines the morning of surgery with A SIP OF WATER: tylenol if needed, metoprolol (lopressor), tamsulosin (flomax)  7 days prior to surgery STOP taking any Aspirin, Meloxicam (mobic), Aleve, Naproxen, Ibuprofen, Motrin, Advil, Goody's, BC's, all herbal medications, fish oil, and all vitamins (Vitamin D, vitamin E)  WHAT DO I DO ABOUT MY DIABETES MEDICATION?   Marland Kitchen Do not take oral diabetes medicines (pills) the morning of surgery. DO NOT TAKE sitagliptin-metformin (Janumet) the day of surgery.  . THE NIGHT BEFORE SURGERY, take 15 units of NPH (Humulin N, Novolin N) insulin. (half of dose)        . THE MORNING OF SURGERY, take 15 units of  NPH (Humulin N, Novolin N) insulin. (half of dose) call Dr. Chauncey Reading regarding these instructions if blood sugars are running low.   . The day of surgery, do not take other diabetes injectables. DO NOT TAKE VICTOZA the day of surgery      How to Manage Your Diabetes Before and After Surgery  Why is it important to control my blood sugar before and after surgery? . Improving blood sugar levels before and after surgery helps healing and can limit problems. . A way of improving blood sugar control is eating a healthy diet by: o  Eating less sugar and carbohydrates o  Increasing activity/exercise o  Talking with your doctor about reaching your blood sugar  goals . High blood sugars (greater than 180 mg/dL) can raise your risk of infections and slow your recovery, so you will need to focus on controlling your diabetes during the weeks before surgery. . Make sure that the doctor who takes care of your diabetes knows about your planned surgery including the date and location.  How do I manage my blood sugar before surgery? . Check your blood sugar at least 4 times a day, starting 2 days before surgery, to make sure that the level is not too high or low. o Check your blood sugar the morning of your surgery when you wake up and every 2 hours until you get to the Short Stay unit. . If your blood sugar is less than 70 mg/dL, you will need to treat for low blood sugar: o Do not take insulin. o Treat a low blood sugar (less than 70 mg/dL) with  cup of clear juice (cranberry or apple), 4 glucose tablets, OR glucose gel. o Recheck blood sugar in 15 minutes after treatment (to make sure it is greater than 70 mg/dL). If your blood sugar is not greater than 70 mg/dL on recheck, call [phone] for further instructions. . Report your blood sugar to the short stay nurse when you get to Short Stay.  . If you are admitted to the hospital after surgery: o Your blood sugar will be checked by the staff and you will probably be given insulin after surgery (instead of oral diabetes medicines)  to make sure you have good blood sugar levels. o The goal for blood sugar control after surgery is 80-180 mg/dL.                 Do not wear jewelry  Do not wear lotions, powders, or colognes, or deoderant.  Men may shave face and neck.  Do not bring valuables to the hospital.  [facility] is not responsible for any belongings or valuables.  Contacts, dentures or bridgework may not be worn into surgery.  Leave your suitcase in the car.  After surgery it may be brought to your room.  For patients admitted to the hospital, discharge time will be determined by your  treatment team.  Patients discharged the day of surgery will not be allowed to drive home.    Special instructions:    [place]- Preparing For Surgery  Before surgery, you can play an important role. Because skin is not sterile, your skin needs to be as free of germs as possible. You can reduce the number of germs on your skin by washing with CHG (chlorahexidine gluconate) Soap before surgery.  CHG is an antiseptic cleaner which kills germs and bonds with the skin to continue killing germs even after washing.  Please do not use if you have an allergy to CHG or antibacterial soaps. If your skin becomes reddened/irritated stop using the CHG.  Do not shave (including legs and underarms) for at least 48 hours prior to first CHG shower. It is OK to shave your face.  Please follow these instructions carefully.   1. Shower the NIGHT BEFORE SURGERY and the MORNING OF SURGERY with CHG.   2. If you chose to wash your hair, wash your hair first as usual with your normal shampoo.  3. After you shampoo, rinse your hair and body thoroughly to remove the shampoo.  4. Use CHG as you would any other liquid soap. You can apply CHG directly to the skin and wash gently with a scrungie or a clean washcloth.   5. Apply the CHG Soap to your body ONLY FROM THE NECK DOWN.  Do not use on open wounds or open sores. Avoid contact with your eyes, ears, mouth and genitals (private parts). Wash genitals (private parts) with your normal soap.  6. Wash thoroughly, paying special attention to the area where your surgery will be performed.  7. Thoroughly rinse your body with warm water from the neck down.  8. DO NOT shower/wash with your normal soap after using and rinsing off the CHG Soap.  9. Pat yourself dry with a CLEAN TOWEL.   10. Wear CLEAN PAJAMAS   11. Place CLEAN SHEETS on your bed the night of your first shower and DO NOT SLEEP WITH PETS.    Day of Surgery: Do not apply any deodorants/lotions.  Please wear clean clothes to the hospital/surgery center.      Please read over the following fact sheets that you were given. MRSA Information

## 2016-05-10 ENCOUNTER — Encounter (HOSPITAL_COMMUNITY)
Admission: RE | Admit: 2016-05-10 | Discharge: 2016-05-10 | Disposition: A | Discharge status: Home / Self Care | Payer: Medicare Other | Source: Ambulatory Visit | Attending: Orthopedic Surgery | Admitting: Orthopedic Surgery

## 2016-05-10 ENCOUNTER — Encounter (HOSPITAL_COMMUNITY): Payer: Self-pay

## 2016-05-10 DIAGNOSIS — Z79899 Other long term (current) drug therapy: Secondary | ICD-10-CM | POA: Diagnosis not present

## 2016-05-10 DIAGNOSIS — Z01818 Encounter for other preprocedural examination: Secondary | ICD-10-CM | POA: Diagnosis present

## 2016-05-10 DIAGNOSIS — E119 Type 2 diabetes mellitus without complications: Secondary | ICD-10-CM | POA: Insufficient documentation

## 2016-05-10 DIAGNOSIS — Z01812 Encounter for preprocedural laboratory examination: Secondary | ICD-10-CM | POA: Insufficient documentation

## 2016-05-10 DIAGNOSIS — M1712 Unilateral primary osteoarthritis, left knee: Secondary | ICD-10-CM | POA: Diagnosis not present

## 2016-05-10 DIAGNOSIS — I5032 Chronic diastolic (congestive) heart failure: Secondary | ICD-10-CM | POA: Insufficient documentation

## 2016-05-10 DIAGNOSIS — G4733 Obstructive sleep apnea (adult) (pediatric): Secondary | ICD-10-CM | POA: Diagnosis not present

## 2016-05-10 DIAGNOSIS — Z794 Long term (current) use of insulin: Secondary | ICD-10-CM | POA: Diagnosis not present

## 2016-05-10 DIAGNOSIS — Z87891 Personal history of nicotine dependence: Secondary | ICD-10-CM | POA: Insufficient documentation

## 2016-05-10 DIAGNOSIS — Z0183 Encounter for blood typing: Secondary | ICD-10-CM | POA: Insufficient documentation

## 2016-05-10 HISTORY — DX: Frequency of micturition: R35.0

## 2016-05-10 LAB — COMPREHENSIVE METABOLIC PANEL
ALBUMIN: 4.1 g/dL (ref 3.5–5.0)
ALK PHOS: 122 U/L (ref 38–126)
ALT: 23 U/L (ref 17–63)
ANION GAP: 8 (ref 5–15)
AST: 27 U/L (ref 15–41)
BILIRUBIN TOTAL: 0.2 mg/dL — AB (ref 0.3–1.2)
BUN: 18 mg/dL (ref 6–20)
CALCIUM: 9.3 mg/dL (ref 8.9–10.3)
CO2: 24 mmol/L (ref 22–32)
Chloride: 108 mmol/L (ref 101–111)
Creatinine, Ser: 1.05 mg/dL (ref 0.61–1.24)
GFR calc Af Amer: >60 mL/min (ref >60)
GFR calc non Af Amer: >60 mL/min (ref >60)
GLUCOSE: 142 mg/dL — AB (ref 65–99)
Potassium: 4.4 mmol/L (ref 3.5–5.1)
Sodium: 140 mmol/L (ref 135–145)
TOTAL PROTEIN: 6.9 g/dL (ref 6.5–8.1)

## 2016-05-10 LAB — CBC WITH DIFFERENTIAL/PLATELET
BASOS PCT: 0 %
Basophils Absolute: 0 10*3/uL (ref 0.0–0.1)
Eosinophils Absolute: 0.3 10*3/uL (ref 0.0–0.7)
Eosinophils Relative: 2 %
HEMATOCRIT: 45.7 % (ref 39.0–52.0)
HEMOGLOBIN: 14.7 g/dL (ref 13.0–17.0)
LYMPHS ABS: 2.7 10*3/uL (ref 0.7–4.0)
LYMPHS PCT: 24 %
MCH: 28.4 pg (ref 26.0–34.0)
MCHC: 32.2 g/dL (ref 30.0–36.0)
MCV: 88.4 fL (ref 78.0–100.0)
MONOS PCT: 6 %
Monocytes Absolute: 0.6 10*3/uL (ref 0.1–1.0)
NEUTROS ABS: 7.5 10*3/uL (ref 1.7–7.7)
NEUTROS PCT: 68 %
Platelets: 295 10*3/uL (ref 150–400)
RBC: 5.17 MIL/uL (ref 4.22–5.81)
RDW: 15.7 % — ABNORMAL HIGH (ref 11.5–15.5)
WBC: 11 10*3/uL — ABNORMAL HIGH (ref 4.0–10.5)

## 2016-05-10 LAB — TYPE AND SCREEN
ABO/RH(D): O POS
Antibody Screen: NEGATIVE

## 2016-05-10 LAB — APTT: aPTT: 33 seconds (ref 24–36)

## 2016-05-10 LAB — PROTIME-INR
INR: 0.92
Prothrombin Time: 12.3 seconds (ref 11.4–15.2)

## 2016-05-10 LAB — SURGICAL PCR SCREEN
MRSA, PCR: NEGATIVE
Staphylococcus aureus: NEGATIVE

## 2016-05-10 LAB — GLUCOSE, CAPILLARY: Glucose-Capillary: 116 mg/dL — ABNORMAL HIGH (ref 65–99)

## 2016-05-10 NOTE — Progress Notes (Signed)
PCP: Dr. Emelda Fear Cardiologist: Dr. Harl Bowie, clearance note in epic ECHO: 11/24/15 Stress test >15 yr ago Cath: 07/21/15 EKG: 08/19/15 CXR: 09/20/15  Pt with no complaints of chest pain, SOB at this time.   Pt states blood sugar has been running low recently (60 this AM) d/t trying to cut back on eating. Pt given Diabetic instructions regarding diabetic medicine per standing orders, but instructed to call Dr. Chauncey Reading regarding low blood sugars and if there is a need to adjust current medicines. Pt verbalized understanding.Pt states if his blood sugar is low, he does not take his insulin.  Pt instructed to eat a high protein snack prior to midnight the night before surgery and to check blood sugar in the AM.

## 2016-05-10 NOTE — Progress Notes (Signed)
Pt requesting to not have some "anesthesiologist" for this surgery due to a needle being put in his arm before heart surgery that caused his arm to swell.

## 2016-05-11 LAB — HEMOGLOBIN A1C
HEMOGLOBIN A1C: 7.3 % — AB (ref 4.8–5.6)
Mean Plasma Glucose: 163 mg/dL

## 2016-05-12 LAB — URINE CULTURE

## 2016-05-12 NOTE — Progress Notes (Signed)
Anesthesia Chart Review:  Pt is a 70 year old male scheduled for L total knee arthroplasty on 05/22/2016 with Elsie Saas, MD.   - Cardiologist is Carlyle Dolly, MD who cleared pt for surgery at 04/24/16 office visit.  - PCP is Emelda Fear, MD.   PMH includes:  Aortic stenosis (s/p AVR 08/18/15), chronic diastolic HF, DM, OSA, asthma (as a child). Former smoker. BMI 35  Medications include: ASA, zetia, humulin N, liraglutide, metoprolol, potassium, sitagliptin-metformin  Preoperative labs reviewed.   - glucose 142, hgbA1c 7.3 - Pt has UTI. Left voicemail for Sherri in Dr. Archie Endo office.   CXR 09/20/15:  1. Mild enlargement of the cardiopericardial silhouette, without overt edema. 2. Trace bilateral pleural effusions. 3. Peripheral linear opacities along the lingula favor subsegmental atelectasis or scarring, less likely to be Kerley B-lines. 4. Thoracic spondylosis.  EKG 08/19/15: Sinus tachycardia (102 bpm) with 1st degree A-V block. Nonspecific T wave abnormality  Echo 11/24/15:  - Left ventricle: The cavity size was normal. Wall thickness was increased in a pattern of moderate LVH. Systolic function was normal. The estimated ejection fraction was in the range of 60% to 65%. Wall motion was normal; there were no regional wall motion abnormalities. Doppler parameters are consistent with abnormal left ventricular relaxation (grade 1 diastolic dysfunction). - Aortic valve: Valve area (VTI): 3.49 cm^2. Valve area (Vmax): 3.29 cm^2. Valve area (Vmean): 3.15 cm^2. - Technically adequate study.  Carotid duplex 08/16/15: Bilateral - 1% to 39% ICA stenosis  Cardiac cath 07/21/15:  1. Known severe aortic stenosis by echo criteria 2. 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   Willeen Cass, FNP-BC [facility] ([facility]/Anesthesiology Phone:  [phone] 05/12/2016 1:46 PM

## 2016-05-19 MED ORDER — DEXTROSE 5 % IV SOLN
3.0000 g | INTRAVENOUS | Status: AC
  Administered 2016-05-22: 3 g via INTRAVENOUS
  Filled 2016-05-19: qty 3000

## 2016-05-21 NOTE — Anesthesia Preprocedure Evaluation (Addendum)
Anesthesia Evaluation  Patient identified by MRN, date of birth, ID band Patient awake    Reviewed: Allergy & Precautions, NPO status , Patient's Chart, lab work & pertinent test results, reviewed documented beta blocker date and time   History of Anesthesia Complications Negative for: history of anesthetic complications  Airway Mallampati: III  TM Distance: >3 FB Neck ROM: Full    Dental  (+) Edentulous Upper, Edentulous Lower   Pulmonary neg shortness of breath, asthma (as a child) , sleep apnea (intolerant of CPAP) , neg COPD, neg recent URI, former smoker,    Pulmonary exam normal breath sounds clear to auscultation       Cardiovascular (-) hypertension(-) angina+CHF (diastolic dysfunction)  (-) Past MI, (-) Cardiac Stents and (-) CABG + dysrhythmias (1st degree AV block) + Valvular Problems/Murmurs (s/p bioprosthetic AVR 08/18/2015) AS  Rhythm:Regular Rate:Normal  1. Mild enlargement of the cardiopericardial silhouette, without overt edema. 2. Trace bilateral pleural effusions. 3. Peripheral linear opacities along the lingula favor subsegmental atelectasis or scarring, less likely to be Kerley B-lines. 4. Thoracic spondylosis.  EKG 08/19/15: Sinus tachycardia (102 bpm) with 1st degree A-V block. Nonspecific T wave abnormality  Echo 11/24/15:  - Left ventricle: The cavity size was normal. Wall thickness was increased in a pattern of moderate LVH. Systolic function was normal. The estimated ejection fraction was in the range of 60% to 65%. Wall motion was normal; there were no regional wall motion abnormalities. Doppler parameters are consistent with abnormal left ventricular relaxation (grade 1 diastolic dysfunction). - Aortic valve: Valve area (VTI): 3.49 cm^2. Valve area (Vmax): 3.29 cm^2. Valve area (Vmean): 3.15 cm^2. - Technically adequate study.  Carotid duplex 08/16/15: Bilateral - 1% to 39% ICA stenosis  Cardiac cath  07/21/15:  1. Known severe aortic stenosis by echo criteria 2. 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), likely most appropriate for medical therapy   Neuro/Psych neg Seizures Right radial nerve injury    GI/Hepatic negative GI ROS, Neg liver ROS,   Endo/Other  diabetes, Type 2, Insulin Dependent  Renal/GU negative Renal ROS     Musculoskeletal  (+) Arthritis , Osteoarthritis,    Abdominal (+) + obese,   Peds  Hematology negative hematology ROS (+)   Anesthesia Other Findings   Reproductive/Obstetrics                           Anesthesia Physical Anesthesia Plan  ASA: III  Anesthesia Plan: Spinal and Regional   Post-op Pain Management:  Regional for Post-op pain   Induction: Intravenous  Airway Management Planned: Natural Airway and Simple Face Mask  Additional Equipment:   Intra-op Plan:   Post-operative Plan:   Informed Consent: I have reviewed the patients History and Physical, chart, labs and discussed the procedure including the risks, benefits and alternatives for the proposed anesthesia with the patient or authorized representative who has indicated his/her understanding and acceptance.     Plan Discussed with: CRNA  Anesthesia Plan Comments: (I have discussed risks of neuraxial anesthesia including but not limited to infection, bleeding, nerve injury, back pain, headache, seizures, and failure of block. Patient denies bleeding disorders and is not currently anticoagulated. Labs have been reviewed. Risks and benefits discussed. All patient's questions answered.   Discussed potential risks of nerve blocks including, but not limited to, infection, bleeding, nerve damage, seizures, pneumothorax, respiratory depression, and potential failure of the block. Alternatives to nerve blocks discussed. All questions answered.  Platelets 295 INR 0.92)       Anesthesia Quick Evaluation

## 2016-05-22 ENCOUNTER — Inpatient Hospital Stay (HOSPITAL_COMMUNITY)
Admission: RE | Admit: 2016-05-22 | Discharge: 2016-05-29 | DRG: 470 | Disposition: A | Discharge status: Home Health | Payer: Medicare Other | Source: Ambulatory Visit | Attending: Orthopedic Surgery | Admitting: Orthopedic Surgery

## 2016-05-22 ENCOUNTER — Inpatient Hospital Stay (HOSPITAL_COMMUNITY): Payer: Medicare Other | Admitting: Emergency Medicine

## 2016-05-22 ENCOUNTER — Inpatient Hospital Stay (HOSPITAL_COMMUNITY): Payer: Medicare Other | Admitting: Anesthesiology

## 2016-05-22 ENCOUNTER — Encounter (HOSPITAL_COMMUNITY)
Admission: RE | Disposition: A | Discharge status: Home Health | Payer: Self-pay | Source: Ambulatory Visit | Attending: Orthopedic Surgery

## 2016-05-22 ENCOUNTER — Encounter (HOSPITAL_COMMUNITY): Payer: Self-pay | Admitting: Certified Registered"

## 2016-05-22 DIAGNOSIS — Z791 Long term (current) use of non-steroidal anti-inflammatories (NSAID): Secondary | ICD-10-CM | POA: Diagnosis not present

## 2016-05-22 DIAGNOSIS — M1712 Unilateral primary osteoarthritis, left knee: Secondary | ICD-10-CM | POA: Diagnosis present

## 2016-05-22 DIAGNOSIS — I5032 Chronic diastolic (congestive) heart failure: Secondary | ICD-10-CM | POA: Diagnosis present

## 2016-05-22 DIAGNOSIS — E669 Obesity, unspecified: Secondary | ICD-10-CM | POA: Diagnosis present

## 2016-05-22 DIAGNOSIS — E1165 Type 2 diabetes mellitus with hyperglycemia: Secondary | ICD-10-CM | POA: Diagnosis present

## 2016-05-22 DIAGNOSIS — G4733 Obstructive sleep apnea (adult) (pediatric): Secondary | ICD-10-CM | POA: Diagnosis present

## 2016-05-22 DIAGNOSIS — Z794 Long term (current) use of insulin: Secondary | ICD-10-CM | POA: Diagnosis not present

## 2016-05-22 DIAGNOSIS — M25669 Stiffness of unspecified knee, not elsewhere classified: Secondary | ICD-10-CM

## 2016-05-22 DIAGNOSIS — M25562 Pain in left knee: Secondary | ICD-10-CM | POA: Diagnosis present

## 2016-05-22 DIAGNOSIS — R262 Difficulty in walking, not elsewhere classified: Secondary | ICD-10-CM

## 2016-05-22 DIAGNOSIS — W19XXXA Unspecified fall, initial encounter: Secondary | ICD-10-CM

## 2016-05-22 DIAGNOSIS — J45909 Unspecified asthma, uncomplicated: Secondary | ICD-10-CM | POA: Diagnosis present

## 2016-05-22 DIAGNOSIS — E119 Type 2 diabetes mellitus without complications: Secondary | ICD-10-CM

## 2016-05-22 DIAGNOSIS — Z6832 Body mass index (BMI) 32.0-32.9, adult: Secondary | ICD-10-CM | POA: Diagnosis not present

## 2016-05-22 DIAGNOSIS — Z953 Presence of xenogenic heart valve: Secondary | ICD-10-CM | POA: Diagnosis not present

## 2016-05-22 DIAGNOSIS — Z7982 Long term (current) use of aspirin: Secondary | ICD-10-CM

## 2016-05-22 DIAGNOSIS — M1710 Unilateral primary osteoarthritis, unspecified knee: Secondary | ICD-10-CM | POA: Diagnosis present

## 2016-05-22 DIAGNOSIS — Z85828 Personal history of other malignant neoplasm of skin: Secondary | ICD-10-CM

## 2016-05-22 DIAGNOSIS — I350 Nonrheumatic aortic (valve) stenosis: Secondary | ICD-10-CM | POA: Diagnosis present

## 2016-05-22 HISTORY — PX: TOTAL KNEE ARTHROPLASTY: SHX125

## 2016-05-22 HISTORY — DX: Unilateral primary osteoarthritis, left knee: M17.12

## 2016-05-22 LAB — GLUCOSE, CAPILLARY
GLUCOSE-CAPILLARY: 173 mg/dL — AB (ref 65–99)
GLUCOSE-CAPILLARY: 146 mg/dL — AB (ref 65–99)
GLUCOSE-CAPILLARY: 323 mg/dL — AB (ref 65–99)
Glucose-Capillary: 423 mg/dL — ABNORMAL HIGH (ref 65–99)

## 2016-05-22 SURGERY — ARTHROPLASTY, KNEE, TOTAL
Anesthesia: Regional | Site: Knee | Laterality: Left

## 2016-05-22 MED ORDER — ACETAMINOPHEN 650 MG RE SUPP: 650.0000 mg | Freq: Four times a day (QID) | RECTAL | Status: DC | PRN

## 2016-05-22 MED ORDER — LIDOCAINE 2% (20 MG/ML) 5 ML SYRINGE
INTRAMUSCULAR | Status: AC
  Filled 2016-05-22: qty 5

## 2016-05-22 MED ORDER — INSULIN ASPART 100 UNIT/ML ~~LOC~~ SOLN
6.0000 [IU] | Freq: Three times a day (TID) | SUBCUTANEOUS | Status: DC
  Administered 2016-05-23: 6 [IU] via SUBCUTANEOUS

## 2016-05-22 MED ORDER — CHLORHEXIDINE GLUCONATE 4 % EX LIQD: 60.0000 mL | Freq: Once | CUTANEOUS | Status: DC

## 2016-05-22 MED ORDER — SODIUM CHLORIDE 0.9 % IR SOLN
Status: DC | PRN
Start: 2016-05-22 — End: 2016-05-22
  Administered 2016-05-22: 3000 mL
  Administered 2016-05-22: 1000 mL

## 2016-05-22 MED ORDER — INSULIN ASPART 100 UNIT/ML ~~LOC~~ SOLN
30.0000 [IU] | Freq: Once | SUBCUTANEOUS | Status: AC
  Administered 2016-05-22: 30 [IU] via SUBCUTANEOUS

## 2016-05-22 MED ORDER — INSULIN ASPART 100 UNIT/ML ~~LOC~~ SOLN
0.0000 [IU] | Freq: Three times a day (TID) | SUBCUTANEOUS | Status: DC
  Administered 2016-05-23: 11 [IU] via SUBCUTANEOUS
  Administered 2016-05-23: 20 [IU] via SUBCUTANEOUS
  Administered 2016-05-23: 11 [IU] via SUBCUTANEOUS
  Administered 2016-05-24: 4 [IU] via SUBCUTANEOUS
  Administered 2016-05-24: 3 [IU] via SUBCUTANEOUS
  Administered 2016-05-25 – 2016-05-26 (×5): 4 [IU] via SUBCUTANEOUS
  Administered 2016-05-26: 5 [IU] via SUBCUTANEOUS
  Administered 2016-05-27: 3 [IU] via SUBCUTANEOUS
  Administered 2016-05-27: 4 [IU] via SUBCUTANEOUS
  Administered 2016-05-27: 3 [IU] via SUBCUTANEOUS
  Administered 2016-05-28: 4 [IU] via SUBCUTANEOUS
  Administered 2016-05-28: 7 [IU] via SUBCUTANEOUS
  Administered 2016-05-28 – 2016-05-29 (×2): 4 [IU] via SUBCUTANEOUS

## 2016-05-22 MED ORDER — MENTHOL 3 MG MT LOZG: 1.0000 | LOZENGE | OROMUCOSAL | Status: DC | PRN

## 2016-05-22 MED ORDER — DIPHENHYDRAMINE HCL 12.5 MG/5ML PO ELIX: 12.5000 mg | ORAL_SOLUTION | ORAL | Status: DC | PRN

## 2016-05-22 MED ORDER — HYDROMORPHONE HCL 1 MG/ML IJ SOLN: 0.5000 mg | INTRAMUSCULAR | Status: DC | PRN

## 2016-05-22 MED ORDER — DEXAMETHASONE SODIUM PHOSPHATE 10 MG/ML IJ SOLN
10.0000 mg | Freq: Three times a day (TID) | INTRAMUSCULAR | Status: AC
Start: 2016-05-22 — End: 2016-05-24
  Administered 2016-05-22 – 2016-05-23 (×2): 10 mg via INTRAVENOUS
  Filled 2016-05-22 (×2): qty 1

## 2016-05-22 MED ORDER — ONDANSETRON HCL 4 MG/2ML IJ SOLN
INTRAMUSCULAR | Status: DC | PRN
  Administered 2016-05-22: 4 mg via INTRAVENOUS

## 2016-05-22 MED ORDER — POVIDONE-IODINE 7.5 % EX SOLN
Freq: Once | CUTANEOUS | Status: DC
  Filled 2016-05-22: qty 118

## 2016-05-22 MED ORDER — CEFAZOLIN SODIUM-DEXTROSE 2-4 GM/100ML-% IV SOLN: 2.0000 g | Freq: Four times a day (QID) | INTRAVENOUS | Status: DC

## 2016-05-22 MED ORDER — SUCCINYLCHOLINE CHLORIDE 20 MG/ML IJ SOLN
INTRAMUSCULAR | Status: DC | PRN
  Administered 2016-05-22: 120 mg via INTRAVENOUS

## 2016-05-22 MED ORDER — ONDANSETRON HCL 4 MG/2ML IJ SOLN: 4.0000 mg | Freq: Four times a day (QID) | INTRAMUSCULAR | Status: DC | PRN

## 2016-05-22 MED ORDER — PHENYLEPHRINE HCL 10 MG/ML IJ SOLN
INTRAMUSCULAR | Status: DC | PRN
  Administered 2016-05-22 (×3): 80 ug via INTRAVENOUS

## 2016-05-22 MED ORDER — POTASSIUM CHLORIDE CRYS ER 20 MEQ PO TBCR
20.0000 meq | EXTENDED_RELEASE_TABLET | Freq: Every day | ORAL | Status: DC
  Administered 2016-05-22: 20 meq via ORAL
  Filled 2016-05-22: qty 1

## 2016-05-22 MED ORDER — DEXAMETHASONE SODIUM PHOSPHATE 10 MG/ML IJ SOLN
INTRAMUSCULAR | Status: AC
  Filled 2016-05-22: qty 1

## 2016-05-22 MED ORDER — TAMSULOSIN HCL 0.4 MG PO CAPS
0.4000 mg | ORAL_CAPSULE | Freq: Every day | ORAL | Status: DC
  Administered 2016-05-23 – 2016-05-29 (×7): 0.4 mg via ORAL
  Filled 2016-05-22 (×7): qty 1

## 2016-05-22 MED ORDER — METOCLOPRAMIDE HCL 5 MG PO TABS: 5.0000 mg | ORAL_TABLET | Freq: Three times a day (TID) | ORAL | Status: DC | PRN

## 2016-05-22 MED ORDER — PROPOFOL 10 MG/ML IV BOLUS
INTRAVENOUS | Status: AC
  Filled 2016-05-22: qty 20

## 2016-05-22 MED ORDER — LACTATED RINGERS IV SOLN
INTRAVENOUS | Status: DC
  Administered 2016-05-22 (×2): via INTRAVENOUS

## 2016-05-22 MED ORDER — PHENYLEPHRINE HCL 10 MG/ML IJ SOLN
INTRAVENOUS | Status: DC | PRN
  Administered 2016-05-22: 40 ug/min via INTRAVENOUS

## 2016-05-22 MED ORDER — FENTANYL CITRATE (PF) 100 MCG/2ML IJ SOLN
INTRAMUSCULAR | Status: AC
  Filled 2016-05-22: qty 2

## 2016-05-22 MED ORDER — BUPIVACAINE-EPINEPHRINE (PF) 0.5% -1:200000 IJ SOLN
INTRAMUSCULAR | Status: DC | PRN
  Administered 2016-05-22: 30 mL via PERINEURAL

## 2016-05-22 MED ORDER — DEXAMETHASONE SODIUM PHOSPHATE 10 MG/ML IJ SOLN
INTRAMUSCULAR | Status: DC | PRN
  Administered 2016-05-22: 10 mg via INTRAVENOUS

## 2016-05-22 MED ORDER — ASPIRIN EC 325 MG PO TBEC
325.0000 mg | DELAYED_RELEASE_TABLET | Freq: Every day | ORAL | Status: DC
  Administered 2016-05-23 – 2016-05-29 (×7): 325 mg via ORAL
  Filled 2016-05-22 (×7): qty 1

## 2016-05-22 MED ORDER — OXYCODONE HCL 5 MG PO TABS
5.0000 mg | ORAL_TABLET | ORAL | Status: DC | PRN
  Administered 2016-05-22 – 2016-05-25 (×19): 10 mg via ORAL
  Filled 2016-05-22 (×19): qty 2

## 2016-05-22 MED ORDER — INSULIN GLARGINE 100 UNIT/ML ~~LOC~~ SOLN
35.0000 [IU] | Freq: Every day | SUBCUTANEOUS | Status: DC
  Administered 2016-05-22: 35 [IU] via SUBCUTANEOUS
  Filled 2016-05-22: qty 0.35

## 2016-05-22 MED ORDER — ONDANSETRON HCL 4 MG/2ML IJ SOLN
INTRAMUSCULAR | Status: AC
  Filled 2016-05-22: qty 2

## 2016-05-22 MED ORDER — BUPIVACAINE-EPINEPHRINE (PF) 0.25% -1:200000 IJ SOLN
INTRAMUSCULAR | Status: AC
  Filled 2016-05-22: qty 30

## 2016-05-22 MED ORDER — PHENOL 1.4 % MT LIQD: 1.0000 | OROMUCOSAL | Status: DC | PRN

## 2016-05-22 MED ORDER — SUCCINYLCHOLINE CHLORIDE 200 MG/10ML IV SOSY
PREFILLED_SYRINGE | INTRAVENOUS | Status: AC
  Filled 2016-05-22: qty 10

## 2016-05-22 MED ORDER — DOCUSATE SODIUM 100 MG PO CAPS
100.0000 mg | ORAL_CAPSULE | Freq: Two times a day (BID) | ORAL | Status: DC
  Administered 2016-05-22 – 2016-05-28 (×12): 100 mg via ORAL
  Filled 2016-05-22 (×14): qty 1

## 2016-05-22 MED ORDER — DEXTROSE 5 % IV SOLN
1.0000 g | INTRAVENOUS | Status: DC
  Administered 2016-05-22 – 2016-05-28 (×7): 1 g via INTRAVENOUS
  Filled 2016-05-22 (×9): qty 10

## 2016-05-22 MED ORDER — ONDANSETRON HCL 4 MG/2ML IJ SOLN: 4.0000 mg | Freq: Once | INTRAMUSCULAR | Status: DC | PRN

## 2016-05-22 MED ORDER — PHENYLEPHRINE 40 MCG/ML (10ML) SYRINGE FOR IV PUSH (FOR BLOOD PRESSURE SUPPORT)
PREFILLED_SYRINGE | INTRAVENOUS | Status: AC
  Filled 2016-05-22: qty 10

## 2016-05-22 MED ORDER — POTASSIUM CHLORIDE IN NACL 20-0.9 MEQ/L-% IV SOLN
INTRAVENOUS | Status: DC
  Administered 2016-05-22 – 2016-05-23 (×2): via INTRAVENOUS
  Filled 2016-05-22 (×3): qty 1000

## 2016-05-22 MED ORDER — BUPIVACAINE LIPOSOME 1.3 % IJ SUSP
20.0000 mL | INTRAMUSCULAR | Status: DC
  Filled 2016-05-22: qty 20

## 2016-05-22 MED ORDER — INSULIN ASPART 100 UNIT/ML ~~LOC~~ SOLN
0.0000 [IU] | Freq: Every day | SUBCUTANEOUS | Status: DC
  Administered 2016-05-22: 4 [IU] via SUBCUTANEOUS
  Administered 2016-05-24 – 2016-05-28 (×2): 2 [IU] via SUBCUTANEOUS

## 2016-05-22 MED ORDER — PROPOFOL 10 MG/ML IV BOLUS
INTRAVENOUS | Status: DC | PRN
  Administered 2016-05-22: 150 mg via INTRAVENOUS

## 2016-05-22 MED ORDER — METOPROLOL TARTRATE 25 MG PO TABS
25.0000 mg | ORAL_TABLET | Freq: Two times a day (BID) | ORAL | Status: DC
  Administered 2016-05-22 – 2016-05-29 (×13): 25 mg via ORAL
  Filled 2016-05-22 (×14): qty 1

## 2016-05-22 MED ORDER — MIDAZOLAM HCL 2 MG/2ML IJ SOLN
INTRAMUSCULAR | Status: AC
  Filled 2016-05-22: qty 2

## 2016-05-22 MED ORDER — LACTATED RINGERS IV SOLN: INTRAVENOUS | Status: DC

## 2016-05-22 MED ORDER — ACETAMINOPHEN 325 MG PO TABS: 650.0000 mg | ORAL_TABLET | Freq: Four times a day (QID) | ORAL | Status: DC | PRN

## 2016-05-22 MED ORDER — FENTANYL CITRATE (PF) 100 MCG/2ML IJ SOLN: 25.0000 ug | INTRAMUSCULAR | Status: DC | PRN

## 2016-05-22 MED ORDER — ONDANSETRON HCL 4 MG PO TABS: 4.0000 mg | ORAL_TABLET | Freq: Four times a day (QID) | ORAL | Status: DC | PRN

## 2016-05-22 MED ORDER — BUPIVACAINE-EPINEPHRINE 0.25% -1:200000 IJ SOLN
INTRAMUSCULAR | Status: DC | PRN
  Administered 2016-05-22: 30 mL

## 2016-05-22 MED ORDER — METOCLOPRAMIDE HCL 5 MG/ML IJ SOLN: 5.0000 mg | Freq: Three times a day (TID) | INTRAMUSCULAR | Status: DC | PRN

## 2016-05-22 MED ORDER — ALUM & MAG HYDROXIDE-SIMETH 200-200-20 MG/5ML PO SUSP: 30.0000 mL | ORAL | Status: DC | PRN

## 2016-05-22 MED ORDER — POLYETHYLENE GLYCOL 3350 17 G PO PACK
17.0000 g | PACK | Freq: Two times a day (BID) | ORAL | Status: DC
  Administered 2016-05-22 – 2016-05-28 (×11): 17 g via ORAL
  Filled 2016-05-22 (×14): qty 1

## 2016-05-22 MED ORDER — FENTANYL CITRATE (PF) 100 MCG/2ML IJ SOLN
INTRAMUSCULAR | Status: AC
  Administered 2016-05-22: 50 ug
  Filled 2016-05-22: qty 2

## 2016-05-22 SURGICAL SUPPLY — 76 items
APL SKNCLS STERI-STRIP NONHPOA (GAUZE/BANDAGES/DRESSINGS) ×1
BANDAGE ELASTIC 6 VELCRO ST LF (GAUZE/BANDAGES/DRESSINGS) ×2 IMPLANT
BANDAGE ESMARK 6X9 LF (GAUZE/BANDAGES/DRESSINGS) ×1 IMPLANT
BENZOIN TINCTURE PRP APPL 2/3 (GAUZE/BANDAGES/DRESSINGS) ×3 IMPLANT
BLADE SAGITTAL 25.0X1.19X90 (BLADE) ×2 IMPLANT
BLADE SAGITTAL 25.0X1.19X90MM (BLADE) ×1
BLADE SAW SGTL 13X75X1.27 (BLADE) ×5 IMPLANT
BLADE SURG 10 STRL SS (BLADE) ×8 IMPLANT
BNDG CMPR 9X6 STRL LF SNTH (GAUZE/BANDAGES/DRESSINGS) ×1
BNDG CMPR MED 15X6 ELC VLCR LF (GAUZE/BANDAGES/DRESSINGS) ×1
BNDG ELASTIC 6X15 VLCR STRL LF (GAUZE/BANDAGES/DRESSINGS) ×3 IMPLANT
BNDG ESMARK 6X9 LF (GAUZE/BANDAGES/DRESSINGS) ×3
BOWL SMART MIX CTS (DISPOSABLE) ×3 IMPLANT
CAPT KNEE TOTAL 3 ATTUNE ×2 IMPLANT
CEMENT HV SMART SET (Cement) ×6 IMPLANT
CLOSURE STERI-STRIP 1/2X4 (GAUZE/BANDAGES/DRESSINGS) ×1
CLOSURE WOUND 1/2 X4 (GAUZE/BANDAGES/DRESSINGS) ×1
CLSR STERI-STRIP ANTIMIC 1/2X4 (GAUZE/BANDAGES/DRESSINGS) ×1 IMPLANT
COVER SURGICAL LIGHT HANDLE (MISCELLANEOUS) ×3 IMPLANT
CUFF TOURNIQUET SINGLE 34IN LL (TOURNIQUET CUFF) ×3 IMPLANT
CUFF TOURNIQUET SINGLE 44IN (TOURNIQUET CUFF) IMPLANT
DECANTER SPIKE VIAL GLASS SM (MISCELLANEOUS) ×1 IMPLANT
DRAPE EXTREMITY T 121X128X90 (DRAPE) ×3 IMPLANT
DRAPE INCISE IOBAN 66X45 STRL (DRAPES) ×3 IMPLANT
DRAPE PROXIMA HALF (DRAPES) ×3 IMPLANT
DRAPE U-SHAPE 47X51 STRL (DRAPES) ×3 IMPLANT
DRSG AQUACEL AG ADV 3.5X10 (GAUZE/BANDAGES/DRESSINGS) ×2 IMPLANT
DRSG AQUACEL AG ADV 3.5X14 (GAUZE/BANDAGES/DRESSINGS) ×1 IMPLANT
DURAPREP 26ML APPLICATOR (WOUND CARE) ×6 IMPLANT
ELECT CAUTERY BLADE 6.4 (BLADE) ×3 IMPLANT
ELECT REM PT RETURN 9FT ADLT (ELECTROSURGICAL) ×3
ELECTRODE REM PT RTRN 9FT ADLT (ELECTROSURGICAL) ×1 IMPLANT
FACESHIELD WRAPAROUND (MASK) ×3 IMPLANT
FACESHIELD WRAPAROUND OR TEAM (MASK) ×1 IMPLANT
GLOVE BIO SURGEON STRL SZ7 (GLOVE) ×3 IMPLANT
GLOVE BIOGEL PI IND STRL 7.0 (GLOVE) ×1 IMPLANT
GLOVE BIOGEL PI IND STRL 7.5 (GLOVE) ×1 IMPLANT
GLOVE BIOGEL PI INDICATOR 7.0 (GLOVE) ×2
GLOVE BIOGEL PI INDICATOR 7.5 (GLOVE) ×2
GLOVE SS BIOGEL STRL SZ 7.5 (GLOVE) ×1 IMPLANT
GLOVE SUPERSENSE BIOGEL SZ 7.5 (GLOVE) ×2
GLOVE SURG SS PI 6.0 STRL IVOR (GLOVE) ×4 IMPLANT
GOWN STRL REUS W/ TWL LRG LVL3 (GOWN DISPOSABLE) ×1 IMPLANT
GOWN STRL REUS W/ TWL XL LVL3 (GOWN DISPOSABLE) ×2 IMPLANT
GOWN STRL REUS W/TWL LRG LVL3 (GOWN DISPOSABLE) ×3
GOWN STRL REUS W/TWL XL LVL3 (GOWN DISPOSABLE) ×6
HANDPIECE INTERPULSE COAX TIP (DISPOSABLE) ×3
HOOD PEEL AWAY FACE SHEILD DIS (HOOD) ×6 IMPLANT
IMMOBILIZER KNEE 22 UNIV (SOFTGOODS) ×3 IMPLANT
KIT BASIN OR (CUSTOM PROCEDURE TRAY) ×3 IMPLANT
KIT ROOM TURNOVER OR (KITS) ×3 IMPLANT
MANIFOLD NEPTUNE II (INSTRUMENTS) ×3 IMPLANT
MARKER SKIN DUAL TIP RULER LAB (MISCELLANEOUS) ×5 IMPLANT
NDL 18GX1X1/2 (RX/OR ONLY) (NEEDLE) ×1 IMPLANT
NEEDLE 18GX1X1/2 (RX/OR ONLY) (NEEDLE) ×3 IMPLANT
NS IRRIG 1000ML POUR BTL (IV SOLUTION) ×3 IMPLANT
PACK TOTAL JOINT (CUSTOM PROCEDURE TRAY) ×3 IMPLANT
PAD ARMBOARD 7.5X6 YLW CONV (MISCELLANEOUS) ×6 IMPLANT
SET HNDPC FAN SPRY TIP SCT (DISPOSABLE) ×1 IMPLANT
STRIP CLOSURE SKIN 1/2X4 (GAUZE/BANDAGES/DRESSINGS) ×2 IMPLANT
SUCTION FRAZIER HANDLE 10FR (MISCELLANEOUS) ×2
SUCTION TUBE FRAZIER 10FR DISP (MISCELLANEOUS) ×1 IMPLANT
SUT MNCRL AB 3-0 PS2 18 (SUTURE) ×3 IMPLANT
SUT VIC AB 0 CT1 27 (SUTURE) ×6
SUT VIC AB 0 CT1 27XBRD ANBCTR (SUTURE) ×2 IMPLANT
SUT VIC AB 1 CT1 27 (SUTURE) ×3
SUT VIC AB 1 CT1 27XBRD ANBCTR (SUTURE) ×1 IMPLANT
SUT VIC AB 2-0 CT1 27 (SUTURE) ×6
SUT VIC AB 2-0 CT1 TAPERPNT 27 (SUTURE) ×2 IMPLANT
SYR 30ML LL (SYRINGE) ×3 IMPLANT
TOWEL OR 17X24 6PK STRL BLUE (TOWEL DISPOSABLE) ×3 IMPLANT
TOWEL OR 17X26 10 PK STRL BLUE (TOWEL DISPOSABLE) ×3 IMPLANT
TRAY FOLEY CATH 16FR SILVER (SET/KITS/TRAYS/PACK) ×3 IMPLANT
TUBE CONNECTING 12'X1/4 (SUCTIONS) ×1
TUBE CONNECTING 12X1/4 (SUCTIONS) ×2 IMPLANT
YANKAUER SUCT BULB TIP NO VENT (SUCTIONS) ×3 IMPLANT

## 2016-05-22 NOTE — Anesthesia Procedure Notes (Signed)
Anesthesia Regional Block:  Adductor canal block  Pre-Anesthetic Checklist: ,, timeout performed, Correct Patient, Correct Site, Correct Laterality, Correct Procedure, Correct Position, site marked, Risks and benefits discussed,  Surgical consent,  Pre-op evaluation,  At surgeon's request and post-op pain management  Laterality: Left  Prep: chloraprep       Needles:  Injection technique: Single-shot  Needle Type: Echogenic Stimulator Needle     Needle Length: 9cm 9 cm Needle Gauge: 21 and 21 G    Additional Needles: Adductor canal block Narrative:  Start time: 05/22/2016 8:42 AM End time: 05/22/2016 8:44 AM Injection made incrementally with aspirations every 5 mL.  Performed by: Personally  Anesthesiologist: Nilda Simmer  Additional Notes: Ultrasound-guided. Picture did not print.

## 2016-05-22 NOTE — Interval H&P Note (Signed)
History and Physical Interval Note:  05/22/2016 7:01 AM  Brent Cobb  has presented today for surgery, with the diagnosis of PRIMARY LOCALIZED OA LEFT KNEE  The various methods of treatment have been discussed with the patient and family. After consideration of risks, benefits and other options for treatment, the patient has consented to  Procedure(s): TOTAL KNEE ARTHROPLASTY (Left) as a surgical intervention .  The patient's history has been reviewed, patient examined, no change in status, stable for surgery.  I have reviewed the patient's chart and labs.  Questions were answered to the patient's satisfaction.     Elsie Saas A

## 2016-05-22 NOTE — Progress Notes (Signed)
Orthopedic Tech Progress Note Patient Details:  Brent Cobb 20-Apr-1946 ID:[id number]  CPM Left Knee CPM Left Knee: On Left Knee Flexion (Degrees): 90 Left Knee Extension (Degrees): 0 Additional Comments: Trapeze bar foot roll   [place] Pink 05/22/2016, 12:18 PM

## 2016-05-22 NOTE — Transfer of Care (Signed)
Immediate Anesthesia Transfer of Care Note  Patient: Brent Cobb  Procedure(s) Performed: Procedure(s): TOTAL KNEE ARTHROPLASTY (Left)  Patient Location: PACU  Anesthesia Type:General  Level of Consciousness: awake  Airway & Oxygen Therapy: Patient Spontanous Breathing and Patient connected to face mask oxygen  Post-op Assessment: Report given to RN  Post vital signs: Reviewed and stable  Last Vitals:  Vitals:   05/22/16 1132 05/22/16 1133  BP:  (!) 143/63  Pulse:  96  Resp:    Temp: 36.5 C     Last Pain:  Vitals:   05/22/16 0715  TempSrc: Oral         Complications: No apparent anesthesia complications

## 2016-05-22 NOTE — Evaluation (Signed)
Physical Therapy Evaluation Patient Details Name: Brent Cobb MRN: [id number] DOB: September 29, 1945 Today's Date: 05/22/2016   History of Present Illness  70 y.o. male admitted to [facility] on 05/22/16 for elective L TKA.  Pt with significant PMHx of DM2, aortic valve replacement, chronic diastolic congestive heart failure, L shoulder surgery, cervical spine surgery, and R carpal tunnel release.    Clinical Impression  Pt is POD #0 and is able to mobilize well with RW around his room.  No reports of lightheadedness and he compensated well for knee instability and pain with bil upper extremities supported on RW.  Despite the fact that his wife can only provide supervision at discharge, I believe he will be able to d/c home with HHPT f/u.   PT to follow acutely for deficits listed below.     Follow Up Recommendations Home health PT;Supervision for mobility/OOB    Equipment Recommendations  Rolling walker with 5" wheels;3in1 (PT)    Recommendations for Other Services   NA    Precautions / Restrictions Precautions Precautions: Knee Precaution Booklet Issued: Yes (comment) Precaution Comments: knee handout given Required Braces or Orthoses: Knee Immobilizer - Left Knee Immobilizer - Left: Other (comment) (until d/c) Restrictions Weight Bearing Restrictions: Yes LLE Weight Bearing: Weight bearing as tolerated      Mobility  Bed Mobility Overal bed mobility: Modified Independent             General bed mobility comments: Pt was able to get EOB with HOB elevated using railings after KI donned in supine.   Transfers Overall transfer level: Needs assistance Equipment used: Rolling walker (2 wheeled) Transfers: Sit to/from Stand Sit to Stand: Min assist;From elevated surface         General transfer comment: Pt stood from maximally elevated bed with min assist to steady trunk and stabilize RW.  Verbal cues for safe hand placement.   Ambulation/Gait Ambulation/Gait assistance: Min  guard Ambulation Distance (Feet): 30 Feet Assistive device: Rolling walker (2 wheeled) Gait Pattern/deviations: Step-to pattern;Antalgic Gait velocity: decreased Gait velocity interpretation: Below normal speed for age/gender General Gait Details: Pt with antalgic gait pattern with some minimal buckling with WB on left leg.  He was able to compensate well with arms.           Balance Overall balance assessment: Needs assistance Sitting-balance support: Feet supported;No upper extremity supported Sitting balance-Leahy Scale: Good     Standing balance support: Bilateral upper extremity supported Standing balance-Leahy Scale: Poor                               Pertinent Vitals/Pain Pain Assessment: Faces Faces Pain Scale: Hurts little more Pain Location: left knee Pain Descriptors / Indicators: Grimacing;Guarding Pain Intervention(s): Limited activity within patient's tolerance;Monitored during session;Repositioned    Home Living Family/patient expects to be discharged to:: Private residence Living Arrangements: Spouse/significant other Available Help at Discharge: Family;Available 24 hours/day (wife can only supervise, not provide assist as she uses cane) Type of Home: House Home Access: Level entry     Home Layout: One level;Laundry or work area in basement (pt has a Barrister's clerk in the basement. ) Home Equipment: None (he has a Scientist, clinical (histocompatibility and immunogenetics), so did not list)      Prior Function Level of Independence: Independent         Comments: retired        Extremity/Trunk Assessment   Upper Extremity Assessment: Overall WFL for tasks assessed  Lower Extremity Assessment: LLE deficits/detail   LLE Deficits / Details: left leg with normal post op pain and weakness.  Pt with at least 3/5 ankle, 2+/5 knee, 3-/5 hip flexion.   Cervical / Trunk Assessment: Other exceptions  Communication   Communication: HOH  Cognition Arousal/Alertness:  Awake/alert Behavior During Therapy: WFL for tasks assessed/performed Overall Cognitive Status: Within Functional Limits for tasks assessed                         Exercises Total Joint Exercises Ankle Circles/Pumps: AROM;Both;20 reps   Assessment/Plan    PT Assessment Patient needs continued PT services  PT Problem List Decreased strength;Decreased range of motion;Decreased activity tolerance;Decreased balance;Decreased mobility;Decreased knowledge of use of DME;Decreased knowledge of precautions;Pain;Obesity          PT Treatment Interventions DME instruction;Gait training;Functional mobility training;Therapeutic activities;Therapeutic exercise;Balance training;Stair training;Patient/family education;Manual techniques;Modalities    PT Goals (Current goals can be found in the Care Plan section)  Acute Rehab PT Goals Patient Stated Goal: to go home at d/c PT Goal Formulation: With patient/family Time For Goal Achievement: 05/29/16 Potential to Achieve Goals: Good    Frequency 7X/week   Barriers to discharge Decreased caregiver support Pt's wife is not physically able to help him at all.        End of Session Equipment Utilized During Treatment: Left knee immobilizer Activity Tolerance: Patient limited by pain Patient left: in chair;with call bell/phone within reach;with family/visitor present           Time: [id number] PT Time Calculation (min) (ACUTE ONLY): 30 min   Charges:   PT Evaluation $PT Eval Moderate Complexity: 1 Procedure PT Treatments $Therapeutic Activity: 8-22 mins        Lindalou Soltis B. [place], [place], DPT [phone]   05/22/2016, 4:13 PM

## 2016-05-22 NOTE — Anesthesia Postprocedure Evaluation (Signed)
Anesthesia Post Note  Patient: Brent Cobb  Procedure(s) Performed: Procedure(s) (LRB): TOTAL KNEE ARTHROPLASTY (Left)  Patient location during evaluation: PACU Anesthesia Type: General and Regional Level of consciousness: awake and alert Pain management: pain level controlled Vital Signs Assessment: post-procedure vital signs reviewed and stable Respiratory status: spontaneous breathing, nonlabored ventilation and respiratory function stable Cardiovascular status: blood pressure returned to baseline and stable Postop Assessment: no signs of nausea or vomiting Anesthetic complications: no    Last Vitals:  Vitals:   05/22/16 1300 05/22/16 1315  BP: 126/60 (!) 132/57  Pulse: 76 81  Resp: 12 14  Temp:      Last Pain:  Vitals:   05/22/16 1300  TempSrc:   PainSc: 0-No pain                 Nilda Simmer

## 2016-05-22 NOTE — Op Note (Signed)
MRN:     [id number] DOB/AGE:    05-17-1946 / 70 y.o.       OPERATIVE REPORT    DATE OF PROCEDURE:  05/22/2016       PREOPERATIVE DIAGNOSIS:   PRIMARY LOCALIZED OA LEFT KNEE      Estimated body mass index is 35.09 kg/m as calculated from the following:   Height as of this encounter: 6\' 1"  (1.854 m).   Weight as of this encounter: 120.7 kg (266 lb).                                                        POSTOPERATIVE DIAGNOSIS:   SAME                                                                    PROCEDURE:  Procedure(s): TOTAL KNEE ARTHROPLASTY Using Depuy Attune RP implants #7 Femur, #7Tibia, 38mm  RP bearing, 35 Patella     SURGEON: Chrystopher Stangl A    ASSISTANT:  Kirstin Shepperson PA-C   (Present and scrubbed throughout the case, critical for assistance with exposure, retraction, instrumentation, and closure.)         ANESTHESIA: GET with Adductor Nerve Block     TOURNIQUET TIME: [id number]   COMPLICATIONS:  None     SPECIMENS: None   INDICATIONS FOR PROCEDURE: The patient has  OA LEFT KNEE, varus deformities, XR shows bone on bone arthritis. Patient has failed all conservative measures including anti-inflammatory medicines, narcotics, attempts at  exercise and weight loss, cortisone injections and viscosupplementation.  Risks and benefits of surgery have been discussed, questions answered.   DESCRIPTION OF PROCEDURE: The patient identified by armband, received  right femoral nerve block and IV antibiotics, in the holding area at [facility]. Patient taken to the operating room, appropriate anesthetic  monitors were attached General endotracheal anesthesia induced with  the patient in supine position, Foley catheter was inserted. Tourniquet  applied high to the operative thigh. Lateral post and foot positioner  applied to the table, the lower extremity was then prepped and draped  in usual sterile fashion from the ankle to the tourniquet. Time-out procedure was  performed. The limb was wrapped with an Esmarch bandage and the tourniquet inflated to 365 mmHg. We began the operation by making the anterior midline incision starting at handbreadth above the patella going over the patella 1 cm medial to and  4 cm distal to the tibial tubercle. Small bleeders in the skin and the  subcutaneous tissue identified and cauterized. Transverse retinaculum was incised and reflected medially and a medial parapatellar arthrotomy was accomplished. the patella was everted and theprepatellar fat pad resected. The superficial medial collateral  ligament was then elevated from anterior to posterior along the proximal  flare of the tibia and anterior half of the menisci resected. The knee was hyperflexed exposing bone on bone arthritis. Peripheral and notch osteophytes as well as the cruciate ligaments were then resected. We continued to  work our way around posteriorly along the proximal tibia, and externally  rotated the tibia subluxing it out from  underneath the femur. A McHale  retractor was placed through the notch and a lateral Hohmann retractor  placed, and we then drilled through the proximal tibia in line with the  axis of the tibia followed by an intramedullary guide rod and 2-degree  posterior slope cutting guide. The tibial cutting guide was pinned into place  allowing resection of 4 mm of bone medially and about 6 mm of bone  laterally because of her varus deformity. Satisfied with the tibial resection, we then  entered the distal femur 2 mm anterior to the PCL origin with the  intramedullary guide rod and applied the distal femoral cutting guide  set at 67mm, with 5 degrees of valgus. This was pinned along the  epicondylar axis. At this point, the distal femoral cut was accomplished without difficulty. We then sized for a #7 femoral component and pinned the guide in 3 degrees of external rotation.The chamfer cutting guide was pinned into place. The anterior,  posterior, and chamfer cuts were accomplished without difficulty followed by  the  RP box cutting guide and the box cut. We also removed posterior osteophytes from the posterior femoral condyles. At this  time, the knee was brought into full extension. We checked our  extension and flexion gaps and found them symmetric at 40mm.  The patella thickness measured at 25 mm. We set the cutting guide at 15 and removed the posterior 9.5-10 mm  of the patella sized for 35 button and drilled the lollipop. The knee  was then once again hyperflexed exposing the proximal tibia. We sized for a #7 tibial base plate, applied the smokestack and the conical reamer followed by the the Delta fin keel punch. We then hammered into place the  RP trial femoral component, inserted a 1 trial bearing, trial patellar button, and took the knee through range of motion from 0-130 degrees. No thumb pressure was required for patellar  tracking. At this point, all trial components were removed, a double batch of DePuy HV cement  was mixed and applied to all bony metallic mating surfaces except for the posterior condyles of the femur itself. In order, we  hammered into place the tibial tray and removed excess cement, the femoral component and removed excess cement, a 70mm  RP bearing  was inserted, and the knee brought to full extension with compression.  The patellar button was clamped into place, and excess cement  removed. While the cement cured the wound was irrigated out with normal saline solution pulse lavage.. Ligament stability and patellar tracking were checked and found to be excellent.. The parapatellar arthrotomy was closed with  #1 Vicryl suture. The subcutaneous tissue with 0 and 2-0 undyed  Vicryl suture, and 4-0 Monocryl.. A dressing of Aquaseal,  4 x 4, dressing sponges, Webril, and Ace wrap applied. Needle and sponge count were correct times 2.The patient awakened, extubated, and taken to recovery room without  difficulty. Vascular status was normal, pulses 2+ and symmetric.   Brent Cobb A 05/22/2016, 10:59 AM

## 2016-05-22 NOTE — Anesthesia Procedure Notes (Signed)
Procedure Name: Intubation Date/Time: 05/22/2016 9:19 AM Performed by: Barrington Ellison Pre-anesthesia Checklist: Patient identified, Emergency Drugs available, Suction available and Patient being monitored Patient Re-evaluated:Patient Re-evaluated prior to inductionOxygen Delivery Method: Circle System Utilized Preoxygenation: Pre-oxygenation with 100% oxygen Intubation Type: IV induction Ventilation: Mask ventilation without difficulty and Oral airway inserted - appropriate to patient size Laryngoscope Size: Mac and 3 Grade View: Grade I Tube type: Oral Tube size: 7.5 mm Number of attempts: 1 Airway Equipment and Method: Stylet and Oral airway Placement Confirmation: ETT inserted through vocal cords under direct vision,  positive ETCO2 and breath sounds checked- equal and bilateral Secured at: 22 cm Tube secured with: Tape Dental Injury: Teeth and Oropharynx as per pre-operative assessment

## 2016-05-23 ENCOUNTER — Encounter (HOSPITAL_COMMUNITY): Payer: Self-pay | Admitting: Orthopedic Surgery

## 2016-05-23 LAB — CBC
HCT: 34.8 % — ABNORMAL LOW (ref 39.0–52.0)
Hemoglobin: 11.7 g/dL — ABNORMAL LOW (ref 13.0–17.0)
MCH: 31.8 pg (ref 26.0–34.0)
MCHC: 33.6 g/dL (ref 30.0–36.0)
MCV: 94.6 fL (ref 78.0–100.0)
PLATELETS: 272 10*3/uL (ref 150–400)
RBC: 3.68 MIL/uL — AB (ref 4.22–5.81)
RDW: 15.5 % (ref 11.5–15.5)
WBC: 16.5 10*3/uL — ABNORMAL HIGH (ref 4.0–10.5)

## 2016-05-23 LAB — GLUCOSE, CAPILLARY
GLUCOSE-CAPILLARY: 467 mg/dL — AB (ref 65–99)
GLUCOSE-CAPILLARY: 446 mg/dL — AB (ref 65–99)
GLUCOSE-CAPILLARY: 341 mg/dL — AB (ref 65–99)
GLUCOSE-CAPILLARY: 286 mg/dL — AB (ref 65–99)
GLUCOSE-CAPILLARY: 112 mg/dL — AB (ref 65–99)
Glucose-Capillary: 295 mg/dL — ABNORMAL HIGH (ref 65–99)
Glucose-Capillary: 491 mg/dL — ABNORMAL HIGH (ref 65–99)

## 2016-05-23 LAB — BASIC METABOLIC PANEL
Anion gap: 6 (ref 5–15)
BUN: 28 mg/dL — AB (ref 6–20)
CHLORIDE: 106 mmol/L (ref 101–111)
CO2: 23 mmol/L (ref 22–32)
CREATININE: 1.41 mg/dL — AB (ref 0.61–1.24)
Calcium: 8.7 mg/dL — ABNORMAL LOW (ref 8.9–10.3)
GFR calc Af Amer: 56 mL/min — ABNORMAL LOW (ref >60)
GFR, EST NON AFRICAN AMERICAN: 49 mL/min — AB (ref >60)
GLUCOSE: 263 mg/dL — AB (ref 65–99)
POTASSIUM: 5.9 mmol/L — AB (ref 3.5–5.1)
Sodium: 135 mmol/L (ref 135–145)

## 2016-05-23 MED ORDER — INSULIN GLARGINE 100 UNIT/ML ~~LOC~~ SOLN
45.0000 [IU] | Freq: Every day | SUBCUTANEOUS | Status: DC
  Filled 2016-05-23: qty 0.45

## 2016-05-23 MED ORDER — LIRAGLUTIDE 18 MG/3ML ~~LOC~~ SOPN
1.2000 mg | PEN_INJECTOR | Freq: Every day | SUBCUTANEOUS | Status: DC
  Administered 2016-05-24 – 2016-05-29 (×6): 1.2 mg via SUBCUTANEOUS

## 2016-05-23 MED ORDER — INSULIN NPH (HUMAN) (ISOPHANE) 100 UNIT/ML ~~LOC~~ SUSP
45.0000 [IU] | Freq: Two times a day (BID) | SUBCUTANEOUS | Status: DC
  Filled 2016-05-23: qty 10

## 2016-05-23 MED ORDER — INSULIN ASPART 100 UNIT/ML ~~LOC~~ SOLN
35.0000 [IU] | Freq: Three times a day (TID) | SUBCUTANEOUS | Status: DC
Start: 2016-05-23 — End: 2016-05-23

## 2016-05-23 MED ORDER — INSULIN NPH (HUMAN) (ISOPHANE) 100 UNIT/ML ~~LOC~~ SUSP
45.0000 [IU] | Freq: Two times a day (BID) | SUBCUTANEOUS | Status: DC
  Administered 2016-05-23 (×2): 45 [IU] via SUBCUTANEOUS
  Filled 2016-05-23: qty 10

## 2016-05-23 MED ORDER — SODIUM CHLORIDE 0.9 % IV SOLN
INTRAVENOUS | Status: DC
  Administered 2016-05-23 – 2016-05-24 (×2): via INTRAVENOUS
  Filled 2016-05-23 (×3): qty 1000

## 2016-05-23 MED ORDER — INSULIN ASPART 100 UNIT/ML ~~LOC~~ SOLN
15.0000 [IU] | Freq: Three times a day (TID) | SUBCUTANEOUS | Status: DC
  Administered 2016-05-23 (×2): 15 [IU] via SUBCUTANEOUS

## 2016-05-23 NOTE — Progress Notes (Signed)
Orthopedic Tech Progress Note Patient Details:  Brent Cobb 1945-10-01 [id number]  Patient ID: Brent Cobb, male   DOB: 12-Oct-1945, 70 y.o.   MRN: [id number] Applied cpm 0-70  Karolee Stamps 05/23/2016, 5:32 AM

## 2016-05-23 NOTE — Progress Notes (Signed)
OT Cancellation Note  Patient Details Name: Brent Cobb MRN: [id number] DOB: [birth year]   Cancelled Treatment:    Reason Eval/Treat Not Completed: Patient not medically ready. Pt's blood glucose currently 490 spoke with PA who wants therapy to wait until he has his insulin for use to see pt.  Almon Register [id number] 05/23/2016, 9:56 AM

## 2016-05-23 NOTE — Progress Notes (Signed)
Inpatient Diabetes Program Recommendations  AACE/ADA: New Consensus Statement on Inpatient Glycemic Control (2015)  Target Ranges:  Prepandial:   less than 140 mg/dL      Peak postprandial:   less than 180 mg/dL (1-2 hours)      Critically ill patients:  140 - 180 mg/dL   Lab Results  Component Value Date   GLUCAP 491 (H) 05/23/2016   HGBA1C 7.3 (H) 05/10/2016    Review of Glycemic ControlResults for ROB, NIDIFFER (MRN ID:[id number]) as of 05/23/2016 10:12  Ref. Range 05/22/2016 11:39 05/22/2016 17:19 05/22/2016 20:33 05/23/2016 06:40 05/23/2016 09:43  Glucose-Capillary Latest Ref Range: 65 - 99 mg/dL 146 (H) 423 (H) 323 (H) 295 (H) 491 (H)   Diabetes history: Type 2 diabetes Outpatient Diabetes medications: NPH 30 units tid with meals, Victoza 1.2 mg daily, Janumet 50-1000 mg bid  Current orders for Inpatient glycemic control:  Novolog resistant tid with meals and HS, Novolog 15 units tid with meals, NPH 45 units bid  Inpatient Diabetes Program Recommendations:   Orders just changed by PA due to elevated blood sugars.  Note that patient is receiving Decadron which is likely increasing blood sugars.  Will follow.  Thanks, Adah Perl, RN, BC-ADM Inpatient Diabetes Coordinator Pager [phone] (8a-5p)

## 2016-05-23 NOTE — Progress Notes (Addendum)
Subjective: 1 Day Post-Op Procedure(s) (LRB): TOTAL KNEE ARTHROPLASTY (Left) Patient reports pain as 6 on 0-10 scale.  Patient is diaphoretic and ruddy.    He is having difficulty with quad control  Objective: Vital signs in last 24 hours: Temp:  [97.7 F (36.5 C)-98.5 F (36.9 C)] 98.3 F (36.8 C) (09/19 0456) Pulse Rate:  [70-96] 70 (09/19 0456) Resp:  [12-20] 16 (09/19 0456) BP: (118-143)/(44-63) 132/44 (09/19 0456) SpO2:  [93 %-97 %] 96 % (09/19 0456)  Intake/Output from previous day: 09/18 0701 - 09/19 0700 In: 2671.7 [P.O.:420; I.V.:2201.7; IV Piggyback:50] Out: [id number] [Urine:3150; Blood:5] Intake/Output this shift: No intake/output data recorded.   Recent Labs  05/23/16 0417  HGB 11.7*    Recent Labs  05/23/16 0417  WBC 16.5*  RBC 3.68*  HCT 34.8*  PLT 272    Recent Labs  05/23/16 0417  NA 135  K 5.9*  CL 106  CO2 23  BUN 28*  CREATININE 1.41*  GLUCOSE 263*  CALCIUM 8.7*   No results for input(s): LABPT, INR in the last 72 hours.  ABD soft Neurovascular intact Sensation intact distally Intact pulses distally Incision: dressing C/D/I    Assessment/Plan: 1 Day Post-Op Procedure(s) (LRB): TOTAL KNEE ARTHROPLASTY (Left)  Active Problems:   Type II diabetes mellitus (HCC)   Chronic diastolic congestive heart failure (HCC)   Obstructive sleep apnea   S/P aortic valve replacement with bioprosthetic valve   Primary localized osteoarthritis of left knee   Primary localized osteoarthrosis of the knee  Advance diet Up with therapy  Sugars are uncontrolled 491  Very unstable on his feet   In knee immobilized because he buckles.     I have discussed his care with the diabetes care coordinator.  We have resumed his Victoza,  He will take NPH 45 units BID instead of 30 units TID like he does at home.  He is getting a sliding scale and meal time novolog.    Korissa Horsford J 05/23/2016, 9:49 AM

## 2016-05-23 NOTE — Progress Notes (Signed)
PT Cancellation Note  Patient Details Name: Brent Cobb MRN: [id number] DOB: 11/17/1945   Cancelled Treatment:    Reason Eval/Treat Not Completed: Medical issues which prohibited therapyPt with glucose of 341 and pt and family reported 2 instances of lightheadedness and needing to "sit down very fast" earlier today.    Salina April, PTA Pager: [phone]   05/23/2016, 3:52 PM

## 2016-05-23 NOTE — Progress Notes (Signed)
Physical Therapy Treatment Patient Details Name: Brent Cobb MRN: [id number] DOB: 01-26-1946 Today's Date: 05/23/2016    History of Present Illness 70 y.o. male admitted to [facility] on 05/22/16 for elective L TKA.  Pt with significant PMHx of DM2, aortic valve replacement, chronic diastolic congestive heart failure, L shoulder surgery, cervical spine surgery, and R carpal tunnel release.      PT Comments    Patient tolerated therex well. Limited by elevated glucose. Pt received insulin prior to session. Gait/stair training next session if able. Continue to progress as tolerated with anticipated d/c home with HHPT.   Follow Up Recommendations  Home health PT;Supervision for mobility/OOB     Equipment Recommendations  Rolling walker with 5" wheels;3in1 (PT)    Recommendations for Other Services       Precautions / Restrictions Precautions Precautions: Knee Precaution Booklet Issued: Yes (comment) Precaution Comments: knee handout given Required Braces or Orthoses: Knee Immobilizer - Left Knee Immobilizer - Left: Other (comment) (until d/c) Restrictions Weight Bearing Restrictions: Yes LLE Weight Bearing: Weight bearing as tolerated    Mobility  Bed Mobility Overal bed mobility: Modified Independent             General bed mobility comments: HOB flat and min use of rail; increased time for sit to supine; up in chair upon arrival  Transfers Overall transfer level: Needs assistance Equipment used: Rolling walker (2 wheeled) Transfers: Stand Pivot Transfers   Stand pivot transfers: Min guard       General transfer comment: min guard for safety; cues for hand placement and safety as pt began to stand without RW  Ambulation/Gait             General Gait Details: deferred due to elevated glucose   Stairs            Wheelchair Mobility    Modified Rankin (Stroke Patients Only)       Balance     Sitting balance-Leahy Scale: Good       Standing  balance-Leahy Scale: Poor                      Cognition Arousal/Alertness: Awake/alert Behavior During Therapy: WFL for tasks assessed/performed Overall Cognitive Status: Within Functional Limits for tasks assessed                      Exercises Total Joint Exercises Quad Sets: AROM;Left;15 reps Heel Slides: AROM;Left;15 reps Hip ABduction/ADduction: AROM;Left;15 reps Straight Leg Raises: AROM;Left;10 reps Long Arc Quad: AROM;Left;15 reps;Seated Knee Flexion: AROM;Left;15 reps;Seated Goniometric ROM: 0-90    General Comments General comments (skin integrity, edema, etc.): pt with high glucose; pt given insulin prior to session and RN aware      Pertinent Vitals/Pain Pain Assessment: Faces Faces Pain Scale: Hurts little more Pain Location: L knee Pain Descriptors / Indicators: Aching;Guarding;Sore Pain Intervention(s): Limited activity within patient's tolerance;Monitored during session;Premedicated before session;Repositioned    Home Living                      Prior Function            PT Goals (current goals can now be found in the care plan section) Acute Rehab PT Goals Patient Stated Goal: to go home at d/c PT Goal Formulation: With patient/family Time For Goal Achievement: 05/29/16 Potential to Achieve Goals: Good Progress towards PT goals: Progressing toward goals    Frequency    7X/week  PT Plan Current plan remains appropriate    Co-evaluation             End of Session Equipment Utilized During Treatment: Left knee immobilizer Activity Tolerance: Other (comment) (limited by elevated glucose) Patient left: with call bell/phone within reach;with family/visitor present;in bed     Time: [id number] PT Time Calculation (min) (ACUTE ONLY): 33 min  Charges:  $Therapeutic Exercise: 8-22 mins $Therapeutic Activity: 8-22 mins                    G Codes:      Salina April, PTA Pager: [phone]   05/23/2016, 12:08 PM

## 2016-05-24 LAB — BASIC METABOLIC PANEL
ANION GAP: 6 (ref 5–15)
BUN: 25 mg/dL — AB (ref 6–20)
CALCIUM: 8.8 mg/dL — AB (ref 8.9–10.3)
CO2: 25 mmol/L (ref 22–32)
CREATININE: 1.16 mg/dL (ref 0.61–1.24)
Chloride: 108 mmol/L (ref 101–111)
GFR calc Af Amer: >60 mL/min (ref >60)
GLUCOSE: 68 mg/dL (ref 65–99)
Potassium: 4.4 mmol/L (ref 3.5–5.1)
Sodium: 139 mmol/L (ref 135–145)

## 2016-05-24 LAB — GLUCOSE, CAPILLARY
GLUCOSE-CAPILLARY: 169 mg/dL — AB (ref 65–99)
Glucose-Capillary: 76 mg/dL (ref 65–99)
Glucose-Capillary: 140 mg/dL — ABNORMAL HIGH (ref 65–99)
Glucose-Capillary: 207 mg/dL — ABNORMAL HIGH (ref 65–99)

## 2016-05-24 LAB — CBC
HEMATOCRIT: 32.6 % — AB (ref 39.0–52.0)
Hemoglobin: 10.7 g/dL — ABNORMAL LOW (ref 13.0–17.0)
MCH: 28.7 pg (ref 26.0–34.0)
MCHC: 32.8 g/dL (ref 30.0–36.0)
MCV: 87.4 fL (ref 78.0–100.0)
PLATELETS: 196 10*3/uL (ref 150–400)
RBC: 3.73 MIL/uL — ABNORMAL LOW (ref 4.22–5.81)
RDW: 16 % — AB (ref 11.5–15.5)
WBC: 11.5 10*3/uL — AB (ref 4.0–10.5)

## 2016-05-24 MED ORDER — INSULIN NPH (HUMAN) (ISOPHANE) 100 UNIT/ML ~~LOC~~ SUSP
40.0000 [IU] | Freq: Two times a day (BID) | SUBCUTANEOUS | Status: DC
  Administered 2016-05-24 – 2016-05-25 (×2): 40 [IU] via SUBCUTANEOUS
  Filled 2016-05-24: qty 10

## 2016-05-24 MED ORDER — INSULIN ASPART 100 UNIT/ML ~~LOC~~ SOLN
12.0000 [IU] | Freq: Three times a day (TID) | SUBCUTANEOUS | Status: DC
  Administered 2016-05-24 – 2016-05-25 (×3): 12 [IU] via SUBCUTANEOUS

## 2016-05-24 NOTE — Progress Notes (Signed)
Physical Therapy Treatment Patient Details Name: Brent GandyHerbert Ureta MRN: [id number] DOB: [birth year] Today's Date: 05/24/2016    History of Present Illness 70 y.o. male admitted to [facility] on 05/22/16 for elective L TKA.  Pt with significant PMHx of DM2, aortic valve replacement, chronic diastolic congestive heart failure, L shoulder surgery, cervical spine surgery, and R carpal tunnel release.      PT Comments    Patient tolerated short distance gait and therex this session. Pt is progressing slowly toward mobility goals however requires min/mod A for all mobility demonstrated L knee instability with KI and episode of feeling "faint" requiring chair follow/seated break for safety.  Recommending ST-SNF for further skilled PT services to maximize independence and safety with mobility.   Follow Up Recommendations  SNF;Supervision for mobility/OOB     Equipment Recommendations  Rolling walker with 5" wheels;3in1 (PT)    Recommendations for Other Services       Precautions / Restrictions Precautions Precautions: Knee Precaution Booklet Issued: Yes (comment) Precaution Comments: knee handout given Required Braces or Orthoses: Knee Immobilizer - Left Knee Immobilizer - Left: Other (comment) (until d/c) Restrictions Weight Bearing Restrictions: Yes LLE Weight Bearing: Weight bearing as tolerated    Mobility  Bed Mobility Overal bed mobility: Needs Assistance Bed Mobility: Supine to Sit     Supine to sit: Min assist     General bed mobility comments: HOB flat; heavy use of rails and assist to elevate trunk into sitting  Transfers Overall transfer level: Needs assistance Equipment used: Rolling walker (2 wheeled) Transfers: Sit to/from Stand Sit to Stand: Min assist;Mod assist         General transfer comment: min A first trial from EOB and mod A second trial from recliner with assist to power up into standing and maintain balance for transition of hand placement to RW; cues for  safe hand placement and use of AD  Ambulation/Gait Ambulation/Gait assistance: Min assist;Mod assist Ambulation Distance (Feet): 36 Feet Assistive device: Rolling walker (2 wheeled) Gait Pattern/deviations: Step-to pattern;Decreased stance time - left;Decreased step length - right;Decreased weight shift to left;Trunk flexed Gait velocity: decreased   General Gait Details: vc for sequencing and posture and multimodal cues/assist for engaging L quad during stance phase; pt with heavy reliance on RW and with decreased L knee stability with fatigue; pt with episode of bilat knee weakness/buckling and feeling "faint" requiring seated rest break; VSS   Stairs            Wheelchair Mobility    Modified Rankin (Stroke Patients Only)       Balance     Sitting balance-Leahy Scale: Good       Standing balance-Leahy Scale: Poor                      Cognition Arousal/Alertness: Awake/alert Behavior During Therapy: WFL for tasks assessed/performed Overall Cognitive Status: Within Functional Limits for tasks assessed                      Exercises Total Joint Exercises Quad Sets: AROM;Left;15 reps Heel Slides: AROM;Left;15 reps Hip ABduction/ADduction: AROM;Left;15 reps Straight Leg Raises: Left;10 reps;AAROM Long Arc Quad: AROM;Left;15 reps;Seated Knee Flexion: AROM;Left;15 reps;Seated Goniometric ROM: 0-92    General Comments        Pertinent Vitals/Pain Pain Assessment: Faces Faces Pain Scale: Hurts even more Pain Location: L knee with mobility Pain Descriptors / Indicators: Aching;Grimacing;Guarding;Sore Pain Intervention(s): Limited activity within patient's tolerance;Monitored during session;Premedicated before session;Repositioned  Home Living                      Prior Function            PT Goals (current goals can now be found in the care plan section) Acute Rehab PT Goals Patient Stated Goal: get better  PT Goal  Formulation: With patient/family Time For Goal Achievement: 05/29/16 Potential to Achieve Goals: Good Progress towards PT goals: Progressing toward goals    Frequency    7X/week      PT Plan Discharge plan needs to be updated    Co-evaluation             End of Session Equipment Utilized During Treatment: Left knee immobilizer Activity Tolerance: Patient tolerated treatment well Patient left: with call bell/phone within reach;in chair;with nursing/sitter in room     Time: 0831-0916 PT Time Calculation (min) (ACUTE ONLY): 45 min  Charges:  $Gait Training: 8-22 mins $Therapeutic Exercise: 8-22 mins $Therapeutic Activity: 8-22 mins                    G Codes:      Salina April, PTA Pager: [phone]   05/24/2016, 9:33 AM

## 2016-05-24 NOTE — Progress Notes (Signed)
Inpatient Diabetes Program Recommendations  AACE/ADA: New Consensus Statement on Inpatient Glycemic Control (2015)  Target Ranges:  Prepandial:   less than 140 mg/dL      Peak postprandial:   less than 180 mg/dL (1-2 hours)      Critically ill patients:  140 - 180 mg/dL   Lab Results  Component Value Date   GLUCAP 76 05/24/2016   HGBA1C 7.3 (H) 05/10/2016    Review of Glycemic Control:  Results for TAYTE, LOREK (MRN [id number]) as of 05/24/2016 10:14  Ref. Range 05/23/2016 06:40 05/23/2016 09:43 05/23/2016 10:40 05/23/2016 11:32 05/23/2016 14:24 05/23/2016 16:13 05/23/2016 20:55 05/24/2016 06:33  Glucose-Capillary Latest Ref Range: 65 - 99 mg/dL 295 (H) 491 (H) 467 (H) 446 (H) 341 (H) 286 (H) 112 (H) 76   Diabetes history: Type 2 diabetes Outpatient Diabetes medications: NPH 30 units tid with meals, Victoza 1.2 mg daily, Janumet 50-1000 1 tablet bid with meals Current orders for Inpatient glycemic control:  Novolog resistant tid with meals and HS, Novolog 15 units tid with meals, NPH 45 units bid  Inpatient Diabetes Program Recommendations:    Blood sugars much lower today as patient has been transitioned off steroids.  Please consider reducing NPH to 40 units bid and decrease Novolog to 12 units tid with meals.    Thanks, Adah Perl, RN, BC-ADM Inpatient Diabetes Coordinator Pager [phone] (8a-5p)

## 2016-05-24 NOTE — Care Management (Signed)
Patient was preoperatively setup with [facility]. Now requires shortterm rehab at [facility]. Ladell Heads, [place] for [employer] will arrange. Unit Social worker notified.

## 2016-05-24 NOTE — Clinical Social Work Note (Signed)
Clinical Social Work Assessment  Patient Details  Name: Brent Cobb MRN: [id number] Date of Birth: [birth year]  Date of referral:  05/24/16               Reason for consult:  Facility Placement                Permission sought to share information with:   (Facilities) Permission granted to share information::   (Facilities)  Name::        Agency::     Relationship::     Contact Information:     Housing/Transportation Living arrangements for the past 2 months:  Single Family Home (Patient states that he lives home in [place] with his wife.) Source of Information:    Patient Interpreter Needed:  None Criminal Activity/Legal Involvement Pertinent to Current Situation/Hospitalization:  No - Comment as needed Significant Relationships:  Spouse (Wife/[name] [phone]) Lives with:  Spouse Do you feel safe going back to the place where you live?   (Patient states that he is interested in SNF.) Need for family participation in patient care:  Yes (Comment)  Care giving concerns:  Patient needs assistance with ADLs.   Social Worker assessment / plan:  SW met with patient at bedside. Patient states that he presents to [facility] due to L knee surgery. Patient states that he is interested in SNF due to wife maybe not being able to care for him at home.  Employment status:  Retired Forensic scientist:  Medicare PT Recommendations:  [place] / Referral to community resources:   (SNF)  Patient/Family's Response to care:  Appropriate.  Patient/Family's Understanding of and Emotional Response to Diagnosis, Current Treatment, and Prognosis:  No questions for SW.   Emotional Assessment Appearance:  Appears stated age Attitude/Demeanor/Rapport:   (Appropriate.) Affect (typically observed):  Accepting Orientation:  Oriented to Self, Oriented to Place, Oriented to  Time, Oriented to Situation Alcohol / Substance use:    Psych involvement (Current and /or in the  community):  No (Comment)  Discharge Needs  Concerns to be addressed:  No discharge needs identified Readmission within the last 30 days:  No Current discharge risk:  None Barriers to Discharge:  No Barriers Identified   Venetia Maxon, Khyre Germond R 05/24/2016, 2:26 PM

## 2016-05-24 NOTE — Progress Notes (Signed)
OT Cancellation Note  Patient Details Name: Brandom Sciarrino MRN: [id number] DOB: Jan 14, 1946   Cancelled Treatment:    Reason Eval/Treat Not Completed: Other (comment) Pt with anticipated discharge disposition of SNF. Will defer OT evaluation and further treatment to next venue of care. Please re-consult if needs or situation changes.  Redmond Baseman, OTR/L Pager: [phone] 05/24/2016, 4:54 PM

## 2016-05-24 NOTE — Progress Notes (Signed)
Orthopedic Tech Progress Note Patient Details:  Brent Cobb November 07, 1945 [id number]  Patient ID: [name], male   DOB: 1946/05/16, 70 y.o.   MRN: [id number] Applied cpm 0-80  Karolee Stamps 05/24/2016, 5:44 AM

## 2016-05-24 NOTE — Progress Notes (Signed)
Physical Therapy Treatment Patient Details Name: Brent Cobb MRN: [id number] DOB: 24-Aug-1946 Today's Date: 05/24/2016    History of Present Illness 70 y.o. male admitted to [facility] on 05/22/16 for elective L TKA.  Pt with significant PMHx of DM2, aortic valve replacement, chronic diastolic congestive heart failure, L shoulder surgery, cervical spine surgery, and R carpal tunnel release.      PT Comments    Patient continues to need +2 for safe ambulation. Encouraged pt to work on [employer]. Continue to progress as tolerated with anticipated d/c to SNF for further skilled PT services.    Follow Up Recommendations  SNF;Supervision for mobility/OOB     Equipment Recommendations  Rolling walker with 5" wheels;3in1 (PT)    Recommendations for Other Services       Precautions / Restrictions Precautions Precautions: Knee Precaution Booklet Issued: Yes (comment) Precaution Comments: knee handout given Required Braces or Orthoses: Knee Immobilizer - Left Knee Immobilizer - Left: Other (comment) (until d/c) Restrictions Weight Bearing Restrictions: Yes LLE Weight Bearing: Weight bearing as tolerated    Mobility  Bed Mobility Overal bed mobility: Needs Assistance Bed Mobility: Supine to Sit     Supine to sit: Min assist     General bed mobility comments: assist to elevate trunk and increased time needed; use of rails  Transfers Overall transfer level: Needs assistance Equipment used: Rolling walker (2 wheeled) Transfers: Sit to/from Stand Sit to Stand: Mod assist;+2 safety/equipment         General transfer comment: mod A to rise from EOB and mod +2 from recliner; cues for hand placement and technique  Ambulation/Gait Ambulation/Gait assistance: Min assist;+2 safety/equipment Ambulation Distance (Feet):  (25 and 25) Assistive device: Rolling walker (2 wheeled) Gait Pattern/deviations: Step-to pattern;Decreased stance time - left;Decreased step length -  right;Decreased weight shift to left;Trunk flexed Gait velocity: decreased   General Gait Details: seated rest break required; multimodal cues for L knee extension during stance phase; KI donned; heavy reliance on bilat UE for support   Stairs            Wheelchair Mobility    Modified Rankin (Stroke Patients Only)       Balance     Sitting balance-Leahy Scale: Good       Standing balance-Leahy Scale: Poor                      Cognition Arousal/Alertness: Awake/alert Behavior During Therapy: WFL for tasks assessed/performed Overall Cognitive Status: Within Functional Limits for tasks assessed                      Exercises      General Comments General comments (skin integrity, edema, etc.): encouraged pt to perform HEP       Pertinent Vitals/Pain Pain Assessment: Faces Faces Pain Scale: Hurts even more Pain Location: L knee with mobility Pain Descriptors / Indicators: Aching;Grimacing;Guarding;Sore Pain Intervention(s): Limited activity within patient's tolerance;Monitored during session;Repositioned;Premedicated before session;Ice applied    Home Living                      Prior Function            PT Goals (current goals can now be found in the care plan section) Acute Rehab PT Goals Patient Stated Goal: get better  PT Goal Formulation: With patient/family Time For Goal Achievement: 05/29/16 Potential to Achieve Goals: Good Progress towards PT goals: Progressing toward goals  Frequency    7X/week      PT Plan Discharge plan needs to be updated    Co-evaluation             End of Session Equipment Utilized During Treatment: Left knee immobilizer;Gait belt Activity Tolerance: Patient limited by fatigue Patient left: with call bell/phone within reach;in chair;with nursing/sitter in room     Time: [id number] PT Time Calculation (min) (ACUTE ONLY): 31 min  Charges:  $Gait Training: 8-22  mins $Therapeutic Activity: 8-22 mins                    G Codes:      Salina April, PTA Pager: [phone]   05/24/2016, 3:29 PM

## 2016-05-24 NOTE — Progress Notes (Signed)
Patient is tachy tonight at a pulse of 112- will continue to monitor. Will recheck later in the morning.

## 2016-05-24 NOTE — Progress Notes (Signed)
Subjective: 2 Days Post-Op Procedure(s) (LRB): TOTAL KNEE ARTHROPLASTY (Left) Patient reports pain as 5 on 0-10 scale.    Objective: Vital signs in last 24 hours: Temp:  [98.1 F (36.7 C)-98.3 F (36.8 C)] 98.1 F (36.7 C) (09/20 0523) Pulse Rate:  [72-81] 72 (09/20 0523) Resp:  [16] 16 (09/20 0523) BP: (105-142)/(38-50) 142/47 (09/20 0523) SpO2:  [93 %-97 %] 97 % (09/20 0523)  Intake/Output from previous day: 09/19 0701 - 09/20 0700 In: 2775 [P.O.:720; I.V.:2005; IV Piggyback:50] Out: 2027 [Urine:2027] Intake/Output this shift: Total I/O In: 480 [P.O.:480] Out: 500 [Urine:500]   Recent Labs  05/23/16 0417 05/24/16 0455  HGB 11.7* 10.7*    Recent Labs  05/23/16 0417 05/24/16 0455  WBC 16.5* 11.5*  RBC 3.68* 3.73*  HCT 34.8* 32.6*  PLT 272 196    Recent Labs  05/23/16 0417 05/24/16 0455  NA 135 139  K 5.9* 4.4  CL 106 108  CO2 23 25  BUN 28* 25*  CREATININE 1.41* 1.16  GLUCOSE 263* 68  CALCIUM 8.7* 8.8*   No results for input(s): LABPT, INR in the last 72 hours.  ABD soft Neurovascular intact Sensation intact distally Incision: dressing C/D/I  Assessment/Plan: 2 Days Post-Op Procedure(s) (LRB): TOTAL KNEE ARTHROPLASTY (Left)  Active Problems:   Type II diabetes mellitus (HCC)   Chronic diastolic congestive heart failure (HCC)   Obstructive sleep apnea   S/P aortic valve replacement with bioprosthetic valve   Primary localized osteoarthritis of left knee   Primary localized osteoarthrosis of the knee  Up with therapy D/C IV fluids  Patient continues to be 2+ assist with minimal ambulation of less than 10 feet and is very unsafe   Will continue to work with therapy as an acute care inpatient 2 times a day until he is safe to ambulate with supervision.  Brent Cobb J 05/24/2016, 11:00 AM

## 2016-05-25 LAB — CBC
HEMATOCRIT: 30.2 % — AB (ref 39.0–52.0)
HEMOGLOBIN: 9.7 g/dL — AB (ref 13.0–17.0)
MCH: 28.9 pg (ref 26.0–34.0)
MCHC: 32.1 g/dL (ref 30.0–36.0)
MCV: 89.9 fL (ref 78.0–100.0)
Platelets: 230 10*3/uL (ref 150–400)
RBC: 3.36 MIL/uL — ABNORMAL LOW (ref 4.22–5.81)
RDW: 16.4 % — AB (ref 11.5–15.5)
WBC: 9.7 10*3/uL (ref 4.0–10.5)

## 2016-05-25 LAB — BASIC METABOLIC PANEL
ANION GAP: 9 (ref 5–15)
BUN: 16 mg/dL (ref 6–20)
CHLORIDE: 104 mmol/L (ref 101–111)
CO2: 23 mmol/L (ref 22–32)
CREATININE: 1.01 mg/dL (ref 0.61–1.24)
Calcium: 8 mg/dL — ABNORMAL LOW (ref 8.9–10.3)
GFR calc Af Amer: >60 mL/min (ref >60)
GFR calc non Af Amer: >60 mL/min (ref >60)
GLUCOSE: 197 mg/dL — AB (ref 65–99)
Potassium: 5.6 mmol/L — ABNORMAL HIGH (ref 3.5–5.1)
Sodium: 136 mmol/L (ref 135–145)

## 2016-05-25 LAB — GLUCOSE, CAPILLARY
GLUCOSE-CAPILLARY: 190 mg/dL — AB (ref 65–99)
GLUCOSE-CAPILLARY: 165 mg/dL — AB (ref 65–99)
GLUCOSE-CAPILLARY: 166 mg/dL — AB (ref 65–99)
GLUCOSE-CAPILLARY: 182 mg/dL — AB (ref 65–99)

## 2016-05-25 MED ORDER — BISACODYL 5 MG PO TBEC
10.0000 mg | DELAYED_RELEASE_TABLET | Freq: Once | ORAL | Status: AC
  Administered 2016-05-25: 10 mg via ORAL
  Filled 2016-05-25: qty 2

## 2016-05-25 MED ORDER — INSULIN NPH (HUMAN) (ISOPHANE) 100 UNIT/ML ~~LOC~~ SUSP
45.0000 [IU] | Freq: Two times a day (BID) | SUBCUTANEOUS | Status: DC
  Administered 2016-05-25 – 2016-05-27 (×5): 45 [IU] via SUBCUTANEOUS
  Filled 2016-05-25: qty 10

## 2016-05-25 MED ORDER — DEXAMETHASONE SODIUM PHOSPHATE 4 MG/ML IJ SOLN
8.0000 mg | Freq: Two times a day (BID) | INTRAMUSCULAR | Status: DC
  Administered 2016-05-25: 8 mg via INTRAVENOUS
  Filled 2016-05-25: qty 2

## 2016-05-25 MED ORDER — DEXAMETHASONE SODIUM PHOSPHATE 4 MG/ML IJ SOLN
4.0000 mg | Freq: Two times a day (BID) | INTRAMUSCULAR | Status: DC
  Administered 2016-05-25: 4 mg via INTRAVENOUS
  Filled 2016-05-25: qty 1

## 2016-05-25 MED ORDER — INSULIN ASPART 100 UNIT/ML ~~LOC~~ SOLN
15.0000 [IU] | Freq: Three times a day (TID) | SUBCUTANEOUS | Status: DC
  Administered 2016-05-25 – 2016-05-29 (×12): 15 [IU] via SUBCUTANEOUS

## 2016-05-25 NOTE — Progress Notes (Signed)
Inpatient Diabetes Program Recommendations  AACE/ADA: New Consensus Statement on Inpatient Glycemic Control (2015)  Target Ranges:  Prepandial:   less than 140 mg/dL      Peak postprandial:   less than 180 mg/dL (1-2 hours)      Critically ill patients:  140 - 180 mg/dL   Lab Results  Component Value Date   GLUCAP 190 (H) 05/25/2016   HGBA1C 7.3 (H) 05/10/2016    Review of Glycemic Control Results for OMARIUS, DIAS (MRN [id number]) as of 05/25/2016 10:02  Ref. Range 05/24/2016 06:33 05/24/2016 11:28 05/24/2016 16:10 05/24/2016 21:01 05/25/2016 06:23  Glucose-Capillary Latest Ref Range: 65 - 99 mg/dL 76 140 (H) 169 (H) 207 (H) 190 (H)    Diabetes history: Type 2 diabetes Outpatient Diabetes medications: NPH 30 units tid with meals, Victoza 1.2 mg daily, Janumet 50-1000 1 tablet bid with meals Current orders for Inpatient glycemic control:  Novolog resistant tid with meals and HS, Novolog 15 units tid with meals, NPH 45 units bid  Inpatient Diabetes Program Recommendations:  Patient had a low blood sugar on 05/23/16 on NPH 45 units bid and Novolog 15 units tid. Since he is eating better, unlikely to go too low- monitor closely for hypoglycemia.   If low blood sugars are detected, consider decreasing hs NPH to 40 units.  Gentry Fitz, RN, BA, MHA, CDE Diabetes Coordinator Inpatient Diabetes Program  [phone] (Team Pager) [phone] ([place]) 05/25/2016 10:07 AM

## 2016-05-25 NOTE — Progress Notes (Signed)
Orthopedic Tech Progress Note Patient Details:  Brent Cobb Oct 22, 1945 [id number]  Patient ID: Brent Cobb, male   DOB: 1946/05/05, 70 y.o.   MRN: [id number] Applied cpm 0-80  Karolee Stamps 05/25/2016, 5:57 AM

## 2016-05-25 NOTE — Care Management (Signed)
Patient has not shown improvement with therapy. Has decreased oxygenation. Will start with oxygen and CPAP. Plan is for patient to continue working with  Therapy and if no progress will go to SNF on Monday.

## 2016-05-25 NOTE — Progress Notes (Signed)
Physical Therapy Treatment Patient Details Name: Brent Cobb MRN: [id number] DOB: [birth year] Today's Date: 05/25/2016    History of Present Illness 70 y.o. male admitted to [facility] on 05/22/16 for elective L TKA.  Pt with significant PMHx of DM2, aortic valve replacement, chronic diastolic congestive heart failure, L shoulder surgery, cervical spine surgery, and R carpal tunnel release.      PT Comments    Pt performed increased gait.  Pt initially anxious but improved as tx progressed.  Pt remains to progress slowly.  RN reports plans for d/c Monday.  Will continue therapy during acute hospitalization.  O2 sats on RA during tx 95 %.  RN aware.    Follow Up Recommendations  SNF;Supervision for mobility/OOB     Equipment Recommendations  Rolling walker with 5" wheels;3in1 (PT)    Recommendations for Other Services       Precautions / Restrictions Precautions Precautions: Knee Precaution Booklet Issued: Yes (comment) Precaution Comments: knee handout given Restrictions Weight Bearing Restrictions: Yes LLE Weight Bearing: Weight bearing as tolerated    Mobility  Bed Mobility Overal bed mobility: Needs Assistance Bed Mobility: Supine to Sit     Supine to sit: Supervision     General bed mobility comments: Slow, but no physical assistance required.    Transfers Overall transfer level: Needs assistance Equipment used: Rolling walker (2 wheeled) Transfers: Sit to/from Stand Sit to Stand: Mod assist         General transfer comment: mod assist to rise from EOB and cues for hand placement, forward weight shifting and rocking momentum to improve ease.    Ambulation/Gait Ambulation/Gait assistance: Min assist;Mod assist Ambulation Distance (Feet): 110 Feet Assistive device: Rolling walker (2 wheeled) Gait Pattern/deviations: Step-to pattern;Trunk flexed;Decreased dorsiflexion - left;Decreased stride length;Shuffle;Antalgic Gait velocity: decreased   General Gait  Details: Cues for L knee extension and L heel strike in stance phase.  Pt required cues for upper trunk control and forward gaze.  Pt fatigued post trial with buckling noted x1.     Stairs            Wheelchair Mobility    Modified Rankin (Stroke Patients Only)       Balance Overall balance assessment: Needs assistance   Sitting balance-Leahy Scale: Good       Standing balance-Leahy Scale: Fair                      Cognition Arousal/Alertness: Awake/alert Behavior During Therapy: WFL for tasks assessed/performed Overall Cognitive Status: Within Functional Limits for tasks assessed                      Exercises Total Joint Exercises Ankle Circles/Pumps: AROM;Both;10 reps;Supine Quad Sets: AROM;Left;10 reps;Supine Towel Squeeze: AROM;Both;10 reps;Supine Short Arc Quad: AROM;Left;10 reps;Supine Heel Slides: AAROM;Left;10 reps;Supine Hip ABduction/ADduction: AROM;Left;10 reps;Supine Straight Leg Raises: AAROM;Left;10 reps;Supine Goniometric ROM: 92 degrees flexion.      General Comments        Pertinent Vitals/Pain Pain Assessment: 0-10 Pain Score: 5  Pain Location: L knee Pain Descriptors / Indicators: Aching;Grimacing;Guarding;Sore Pain Intervention(s): Monitored during session;Repositioned;Ice applied    Home Living                      Prior Function            PT Goals (current goals can now be found in the care plan section) Acute Rehab PT Goals Patient Stated Goal: get better  Potential to  Achieve Goals: Good    Frequency    7X/week      PT Plan Discharge plan needs to be updated    Co-evaluation             End of Session Equipment Utilized During Treatment: Gait belt Activity Tolerance: Patient limited by fatigue Patient left: with call bell/phone within reach;in chair;with nursing/sitter in room     Time: 1701-1739 PT Time Calculation (min) (ACUTE ONLY): 38 min  Charges:  $Gait Training: 8-22  mins $Therapeutic Exercise: 8-22 mins $Therapeutic Activity: 8-22 mins                    G Codes:      Cristela Blue 06/21/16, 6:24 PM  Governor Rooks, PTA pager [phone]

## 2016-05-25 NOTE — Care Management Important Message (Signed)
Important Message  Patient Details  Name: Brent Cobb MRN: [id number] Date of Birth: Jan 14, 1946   Medicare Important Message Given:  Yes    Orbie Pyo 05/25/2016, 10:37 AM

## 2016-05-26 LAB — GLUCOSE, CAPILLARY
GLUCOSE-CAPILLARY: 190 mg/dL — AB (ref 65–99)
GLUCOSE-CAPILLARY: 181 mg/dL — AB (ref 65–99)
GLUCOSE-CAPILLARY: 153 mg/dL — AB (ref 65–99)
GLUCOSE-CAPILLARY: 181 mg/dL — AB (ref 65–99)

## 2016-05-26 LAB — COMPREHENSIVE METABOLIC PANEL
ALBUMIN: 2.9 g/dL — AB (ref 3.5–5.0)
ALT: 18 U/L (ref 17–63)
ANION GAP: 8 (ref 5–15)
AST: 32 U/L (ref 15–41)
Alkaline Phosphatase: 93 U/L (ref 38–126)
BUN: 18 mg/dL (ref 6–20)
CHLORIDE: 103 mmol/L (ref 101–111)
CO2: 25 mmol/L (ref 22–32)
Calcium: 8.6 mg/dL — ABNORMAL LOW (ref 8.9–10.3)
Creatinine, Ser: 0.94 mg/dL (ref 0.61–1.24)
GFR calc Af Amer: >60 mL/min (ref >60)
GFR calc non Af Amer: >60 mL/min (ref >60)
GLUCOSE: 225 mg/dL — AB (ref 65–99)
POTASSIUM: 5.3 mmol/L — AB (ref 3.5–5.1)
Sodium: 136 mmol/L (ref 135–145)
TOTAL PROTEIN: 6.3 g/dL — AB (ref 6.5–8.1)
Total Bilirubin: 0.7 mg/dL (ref 0.3–1.2)

## 2016-05-26 LAB — CBC
HCT: 33.3 % — ABNORMAL LOW (ref 39.0–52.0)
Hemoglobin: 10.8 g/dL — ABNORMAL LOW (ref 13.0–17.0)
MCH: 28.7 pg (ref 26.0–34.0)
MCHC: 32.4 g/dL (ref 30.0–36.0)
MCV: 88.6 fL (ref 78.0–100.0)
PLATELETS: 222 10*3/uL (ref 150–400)
RBC: 3.76 MIL/uL — ABNORMAL LOW (ref 4.22–5.81)
RDW: 15.2 % (ref 11.5–15.5)
WBC: 13.4 10*3/uL — AB (ref 4.0–10.5)

## 2016-05-26 MED ORDER — PRO-STAT SUGAR FREE PO LIQD
30.0000 mL | Freq: Two times a day (BID) | ORAL | Status: DC
  Administered 2016-05-26 – 2016-05-28 (×5): 30 mL via ORAL
  Filled 2016-05-26 (×5): qty 30

## 2016-05-26 MED ORDER — BISACODYL 5 MG PO TBEC
10.0000 mg | DELAYED_RELEASE_TABLET | Freq: Once | ORAL | Status: AC
Start: 2016-05-26 — End: 2016-05-26
  Administered 2016-05-26: 10 mg via ORAL
  Filled 2016-05-26: qty 2

## 2016-05-26 MED ORDER — DEXAMETHASONE SODIUM PHOSPHATE 4 MG/ML IJ SOLN
2.0000 mg | Freq: Two times a day (BID) | INTRAMUSCULAR | Status: DC
  Administered 2016-05-26 – 2016-05-27 (×4): 2 mg via INTRAVENOUS
  Filled 2016-05-26 (×4): qty 1

## 2016-05-26 MED ORDER — ACETAMINOPHEN 325 MG PO TABS
650.0000 mg | ORAL_TABLET | Freq: Four times a day (QID) | ORAL | Status: DC
  Administered 2016-05-27 – 2016-05-29 (×9): 650 mg via ORAL
  Filled 2016-05-26 (×9): qty 2

## 2016-05-26 MED ORDER — OXYCODONE HCL 5 MG PO TABS
5.0000 mg | ORAL_TABLET | ORAL | Status: DC | PRN
  Administered 2016-05-26 – 2016-05-29 (×12): 5 mg via ORAL
  Filled 2016-05-26 (×12): qty 1

## 2016-05-26 MED ORDER — ACETAMINOPHEN 650 MG RE SUPP: 650.0000 mg | Freq: Four times a day (QID) | RECTAL | Status: DC

## 2016-05-26 NOTE — Progress Notes (Signed)
Subjective: 4 Days Post-Op Procedure(s) (LRB): TOTAL KNEE ARTHROPLASTY (Left) Patient reports pain as 3 on 0-10 scale.    Objective: Vital signs in last 24 hours: Temp:  [97.4 F (36.3 C)-98.4 F (36.9 C)] 97.4 F (36.3 C) (09/22 0500) Pulse Rate:  [76-89] 78 (09/22 0500) Resp:  [16] 16 (09/22 0500) BP: (128-154)/(50-74) 151/50 (09/22 0500) SpO2:  [95 %-98 %] 98 % (09/22 0500)  Intake/Output from previous day: 09/21 0701 - 09/22 0700 In: 1440 [P.O.:1440] Out: 600 [Urine:600] Intake/Output this shift: No intake/output data recorded.   Recent Labs  05/24/16 0455 05/25/16 0456  HGB 10.7* 9.7*    Recent Labs  05/24/16 0455 05/25/16 0456  WBC 11.5* 9.7  RBC 3.73* 3.36*  HCT 32.6* 30.2*  PLT 196 230    Recent Labs  05/24/16 0455 05/25/16 0456  NA 139 136  K 4.4 5.6*  CL 108 104  CO2 25 23  BUN 25* 16  CREATININE 1.16 1.01  GLUCOSE 68 197*  CALCIUM 8.8* 8.0*   No results for input(s): LABPT, INR in the last 72 hours.  ABD soft Neurovascular intact Sensation intact distally Incision: scant drainage  Assessment/Plan: 4 Days Post-Op Procedure(s) (LRB): TOTAL KNEE ARTHROPLASTY (Left)  Active Problems:   Type II diabetes mellitus (HCC)   Chronic diastolic congestive heart failure (HCC)   Obstructive sleep apnea   S/P aortic valve replacement with bioprosthetic valve   Primary localized osteoarthritis of left knee   Primary localized osteoarthrosis of the knee  Up with therapy  Patient is much better this am   Used CPAP last night and had a great night.  Will use tylenol and occasional oxycodone.  Will need continued hospitalization until stablized on steroid, O2 and ambulation.  Knee still buckling on walking  Brent Cobb J 05/26/2016, 8:40 AM

## 2016-05-26 NOTE — Progress Notes (Signed)
Physical Therapy Treatment Patient Details Name: Brent Cobb MRN: [id number] DOB: 1946-04-20 Today's Date: 05/26/2016    History of Present Illness 70 y.o. male admitted to [facility] on 05/22/16 for elective L TKA.  Pt with significant PMHx of DM2, aortic valve replacement, chronic diastolic congestive heart failure, L shoulder surgery, cervical spine surgery, and R carpal tunnel release.      PT Comments    Pt making progress with therapy and will inform supervising PT of need for change in recommendations.  Pt remains to fatigue but is requiring decreased assist with all mobility.  Plan per RN is d/c Monday.    Follow Up Recommendations  Home health PT;Supervision/Assistance - 24 hour     Equipment Recommendations  Rolling walker with 5" wheels;3in1 (PT)    Recommendations for Other Services       Precautions / Restrictions Precautions Precautions: Knee Restrictions Weight Bearing Restrictions: Yes LLE Weight Bearing: Weight bearing as tolerated    Mobility  Bed Mobility               General bed mobility comments: Pt received in recliner on arrival.    Transfers Overall transfer level: Needs assistance Equipment used: Rolling walker (2 wheeled) Transfers: Sit to/from Stand Sit to Stand: Min assist         General transfer comment: Pt performed transfer from chair with min assist to boost into standing and increased time.  Pt with DOE post transfer and remains to appear anxious.  During stand to sit pt with poor eccentric loading.    Ambulation/Gait Ambulation/Gait assistance: Min assist Ambulation Distance (Feet): 120 Feet   Gait Pattern/deviations: Step-to pattern;Trunk flexed;Antalgic;Decreased dorsiflexion - left Gait velocity: decreased   General Gait Details: Cues for L knee extension and L heel strike in stance phase.  Pt required cues for upper trunk control and forward gaze.  Pt fatigued post trial with buckling noted x1.     Stairs             Wheelchair Mobility    Modified Rankin (Stroke Patients Only)       Balance Overall balance assessment: Needs assistance   Sitting balance-Leahy Scale: Good       Standing balance-Leahy Scale: Fair                      Cognition Arousal/Alertness: Awake/alert Behavior During Therapy: WFL for tasks assessed/performed;Anxious Overall Cognitive Status: Within Functional Limits for tasks assessed                      Exercises Total Joint Exercises Ankle Circles/Pumps: AROM;Both;10 reps;Supine Quad Sets: AROM;Left;10 reps;Supine Heel Slides: AAROM;Left;10 reps;Supine Hip ABduction/ADduction: Left;10 reps;Supine;AAROM Straight Leg Raises: AAROM;Left;10 reps;Supine Goniometric ROM: 94 degrees flexion    General Comments        Pertinent Vitals/Pain Pain Assessment: 0-10 Pain Score: 8  Pain Location: L knee Pain Descriptors / Indicators: Aching;Grimacing;Guarding Pain Intervention(s): Monitored during session;Repositioned;Ice applied    Home Living                      Prior Function            PT Goals (current goals can now be found in the care plan section) Acute Rehab PT Goals Patient Stated Goal: get better  PT Goal Formulation: With patient/family Potential to Achieve Goals: Good Progress towards PT goals: Progressing toward goals    Frequency    7X/week  PT Plan Discharge plan needs to be updated    Co-evaluation             End of Session Equipment Utilized During Treatment: Gait belt Activity Tolerance: Patient limited by fatigue Patient left: with call bell/phone within reach;in chair;with nursing/sitter in room     Time: 0930-0953 PT Time Calculation (min) (ACUTE ONLY): 23 min  Charges:  $Gait Training: 8-22 mins $Therapeutic Exercise: 8-22 mins                    G Codes:      Cristela Blue 15-Jun-2016, 9:57 AM  Governor Rooks, PTA pager [phone]

## 2016-05-26 NOTE — Care Management Important Message (Signed)
Important Message  Patient Details  Name: Brent Cobb MRN: [id number] Date of Birth: 04/08/1946   Medicare Important Message Given:  Yes    Maisen Klingler Montine Circle 05/26/2016, 1:39 PM

## 2016-05-26 NOTE — NC FL2 (Signed)
[place] LEVEL OF CARE SCREENING TOOL     IDENTIFICATION  Patient Name: Brent Cobb Birthdate: Jul 28, 1946 Sex: male Admission Date (Current Location): 05/22/2016  [facility] and [place] Number:  Herbalist and Address:  [facility], [place] [address], [place], [place] [address]      Provider Number: [id number]  Attending Physician Name and Address:  Elsie Saas, MD  Relative Name and Phone Number:       Current Level of Care: Hospital Recommended Level of Care: [place] Prior Approval Number:    Date Approved/Denied:   PASRR Number:  (Pending ... pt from [place])  Discharge Plan: SNF    Current Diagnoses: Patient Active Problem List   Diagnosis Date Noted  . Primary localized osteoarthrosis of the knee 05/22/2016  . Primary localized osteoarthritis of left knee   . Radial nerve injury   . S/P aortic valve replacement with bioprosthetic valve 08/18/2015  . Type II diabetes mellitus ([place])   . Chronic diastolic congestive heart failure ([place])   . Obesity   . Degenerative arthritis of left knee   . Obstructive sleep apnea   . Varicose veins   . Severe aortic stenosis 07/21/2015    Orientation RESPIRATION BLADDER Height & Weight     Self, Time, Situation, Place  Normal Continent Weight: 266 lb (120.7 kg) Height:  6\' 1"  (185.4 cm)  BEHAVIORAL SYMPTOMS/MOOD NEUROLOGICAL BOWEL NUTRITION STATUS           AMBULATORY STATUS COMMUNICATION OF NEEDS Skin   Limited Assist Verbally Normal                       Personal Care Assistance Level of Assistance  Bathing Bathing Assistance: Limited assistance         Functional Limitations Info             SPECIAL CARE FACTORS FREQUENCY                       Contractures      Additional Factors Info   (fULL)               Current Medications (05/26/2016):  This is the current hospital active medication list Current Facility-Administered  Medications  Medication Dose Route Frequency Provider Last Rate Last Dose  . acetaminophen (TYLENOL) tablet 650 mg  650 mg Oral Q6H PRN Kirstin Shepperson, PA-C       Or  . acetaminophen (TYLENOL) suppository 650 mg  650 mg Rectal Q6H PRN Kirstin Shepperson, PA-C      . alum & mag hydroxide-simeth (MAALOX/MYLANTA) 200-200-20 MG/5ML suspension 30 mL  30 mL Oral Q4H PRN Kirstin Shepperson, PA-C      . aspirin EC tablet 325 mg  325 mg Oral Q breakfast Kirstin Shepperson, PA-C   325 mg at 05/26/16 0905  . bisacodyl (DULCOLAX) EC tablet 10 mg  10 mg Oral Once Kirstin Shepperson, PA-C      . cefTRIAXone (ROCEPHIN) 1 g in dextrose 5 % 50 mL IVPB  1 g Intravenous Q24H Kirstin Shepperson, PA-C   1 g at 05/26/16 1252  . dexamethasone (DECADRON) injection 2 mg  2 mg Intravenous Q12H Kirstin Shepperson, PA-C   2 mg at 05/26/16 0910  . diphenhydrAMINE (BENADRYL) 12.5 MG/5ML elixir 12.5-25 mg  12.5-25 mg Oral Q4H PRN Kirstin Shepperson, PA-C      . docusate sodium (COLACE) capsule 100 mg  100  mg Oral BID Kirstin Shepperson, PA-C   100 mg at 05/26/16 [id number]  . feeding supplement (PRO-STAT SUGAR FREE 64) liquid 30 mL  30 mL Oral BID Elsie Saas, MD      . insulin aspart (novoLOG) injection 0-20 Units  0-20 Units Subcutaneous TID WC Kirstin Shepperson, PA-C   4 Units at 05/26/16 1253  . insulin aspart (novoLOG) injection 0-5 Units  0-5 Units Subcutaneous QHS Kirstin Shepperson, PA-C   2 Units at 05/24/16 2153  . insulin aspart (novoLOG) injection 15 Units  15 Units Subcutaneous TID WC Kirstin Shepperson, PA-C   15 Units at 05/26/16 1254  . insulin NPH Human (HUMULIN N,NOVOLIN N) injection 45 Units  45 Units Subcutaneous BID AC & HS Kirstin Shepperson, PA-C   45 Units at 05/26/16 0906  . Liraglutide SOPN 1.2 mg  1.2 mg Subcutaneous Daily Kirstin Shepperson, PA-C   1.2 mg at 05/26/16 1108  . menthol-cetylpyridinium (CEPACOL) lozenge 3 mg  1 lozenge Oral PRN Kirstin Shepperson, PA-C       Or  . phenol (CHLORASEPTIC)  mouth spray 1 spray  1 spray Mouth/Throat PRN Kirstin Shepperson, PA-C      . metoCLOPramide (REGLAN) tablet 5-10 mg  5-10 mg Oral Q8H PRN Kirstin Shepperson, PA-C       Or  . metoCLOPramide (REGLAN) injection 5-10 mg  5-10 mg Intravenous Q8H PRN Kirstin Shepperson, PA-C      . metoprolol tartrate (LOPRESSOR) tablet 25 mg  25 mg Oral BID Kirstin Shepperson, PA-C   25 mg at 05/26/16 0907  . ondansetron (ZOFRAN) tablet 4 mg  4 mg Oral Q6H PRN Kirstin Shepperson, PA-C       Or  . ondansetron (ZOFRAN) injection 4 mg  4 mg Intravenous Q6H PRN Kirstin Shepperson, PA-C      . oxyCODONE (Oxy IR/ROXICODONE) immediate release tablet 5 mg  5 mg Oral Q3H PRN Kirstin Shepperson, PA-C   5 mg at 05/26/16 1252  . polyethylene glycol (MIRALAX / GLYCOLAX) packet 17 g  17 g Oral BID Kirstin Shepperson, PA-C   17 g at 05/26/16 0907  . sodium chloride 0.9 % 1,000 mL infusion   Intravenous Continuous Kirstin Shepperson, PA-C 100 mL/hr at 05/24/16 2346    . tamsulosin (FLOMAX) capsule 0.4 mg  0.4 mg Oral Daily Kirstin Shepperson, PA-C   0.4 mg at 05/26/16 [id number]     Discharge Medications: Please see discharge summary for a list of discharge medications.  Relevant Imaging Results:  Relevant Lab Results:   Additional Information  ([id number])  Bernita Buffy

## 2016-05-26 NOTE — Progress Notes (Signed)
Initial Nutrition Assessment  DOCUMENTATION CODES:   Obesity unspecified  INTERVENTION:  Provide 30 ml Prostat po BID, each supplement provides 100 kcal and 15 grams of protein.   Encourage adequate PO intake.   NUTRITION DIAGNOSIS:   Increased nutrient needs related to  (s/p surgery) as evidenced by estimated needs.  GOAL:   Patient will meet greater than or equal to 90% of their needs  MONITOR:   PO intake, Supplement acceptance, Weight trends, Labs, Skin, I & O's  REASON FOR ASSESSMENT:   Consult Assessment of nutrition requirement/status  ASSESSMENT:   70 y.o. male admitted to [facility] on 05/22/16 for elective L TKA.  Pt with significant PMHx of DM2, aortic valve replacement, chronic diastolic congestive heart failure.  Pt reports having a good appetite currently and PTA with no other difficulties. Meal completion has been 100%. Usual body weight reported to be ~260 lbs. Pt is agreeable to nutritional supplements to aid in caloric and protein needs. RD to order.   Albumin has a half-life of 21 days and is strongly affected by stress response and inflammatory process, therefore, do not expect to see an improvement in this lab value during acute hospitalization.  Pt with no observed significant fat or muscle mass loss.   Labs and medications reviewed. Potassium elevated at 5.3.  Diet Order:  Diet Carb Modified Fluid consistency: Thin; Room service appropriate? Yes  Skin:   (Incision L knee)  Last BM:  9/17  Height:   Ht Readings from Last 1 Encounters:  05/22/16 6\' 1"  (1.854 m)    Weight:   Wt Readings from Last 1 Encounters:  05/22/16 266 lb (120.7 kg)    Ideal Body Weight:  83.6 kg  BMI:  Body mass index is 35.09 kg/m.  Estimated Nutritional Needs:   Kcal:  2100-2300  Protein:  110-120 grams  Fluid:  Per MD  EDUCATION NEEDS:   No education needs identified at this time  Corrin Parker, MS, RD, LDN Pager # [phone] After hours/ weekend pager  # [phone]

## 2016-05-26 NOTE — Progress Notes (Signed)
Physical Therapy Treatment Patient Details Name: Brent Cobb MRN: [id number] DOB: [birth year] Today's Date: 05/26/2016    History of Present Illness 70 y.o. male admitted to [facility] on 05/22/16 for elective L TKA.  Pt with significant PMHx of DM2, aortic valve replacement, chronic diastolic congestive heart failure, L shoulder surgery, cervical spine surgery, and R carpal tunnel release.      PT Comments    Pt performed increased mobility and advanced gait distance.  Pt required decreased assist with functional mobility and wife is pleased with patient progress.  Pt may need stair training for one lower height step to enter home.  Pt reports steps in basement but should not need to access initially.  .    Follow Up Recommendations  Home health PT;Supervision/Assistance - 24 hour     Equipment Recommendations  Rolling walker with 5" wheels;3in1 (PT)    Recommendations for Other Services       Precautions / Restrictions Precautions Precautions: Knee Precaution Comments: knee handout given Required Braces or Orthoses: Knee Immobilizer - Left Knee Immobilizer - Left: Other (comment) Restrictions Weight Bearing Restrictions: Yes LLE Weight Bearing: Weight bearing as tolerated    Mobility  Bed Mobility Overal bed mobility: Needs Assistance Bed Mobility: Supine to Sit     Supine to sit: Supervision     General bed mobility comments: Good technique observed, supervision for safety and hand placement.    Transfers Overall transfer level: Needs assistance Equipment used: Rolling walker (2 wheeled)   Sit to Stand: Supervision Stand pivot transfers: Supervision       General transfer comment: Cues for hand placement.  Re-attempted transfer after poor eccentric loading and 2nd trial he was able to better control his descent.    Ambulation/Gait Ambulation/Gait assistance: Min guard;Min assist Ambulation Distance (Feet): 200 Feet Assistive device: Rolling walker (2 wheeled)    Gait velocity: decreased   General Gait Details: Cues for L knee extension and L heel strike in stance phase.  Pt required cues for upper trunk control and forward gaze.  Knee instability remains but no true buckling.     Stairs            Wheelchair Mobility    Modified Rankin (Stroke Patients Only)       Balance     Sitting balance-Leahy Scale: Good       Standing balance-Leahy Scale: Fair                      Cognition Arousal/Alertness: Awake/alert Behavior During Therapy: WFL for tasks assessed/performed;Anxious Overall Cognitive Status: Within Functional Limits for tasks assessed                      Exercises Total Joint Exercises Ankle Circles/Pumps: AROM;Both;10 reps;Supine Quad Sets: AROM;Left;10 reps;Supine Towel Squeeze: AROM;Both;10 reps;Supine Short Arc Quad: AROM;Left;10 reps;Supine Heel Slides: AAROM;Left;10 reps;Supine Hip ABduction/ADduction: Left;10 reps;Supine;AAROM Straight Leg Raises: AAROM;Left;10 reps;Supine    General Comments        Pertinent Vitals/Pain Pain Assessment: 0-10 Pain Score: 5  Pain Location: L knee Pain Descriptors / Indicators: Aching;Guarding;Grimacing Pain Intervention(s): Monitored during session;Repositioned    Home Living                      Prior Function            PT Goals (current goals can now be found in the care plan section) Acute Rehab PT Goals Patient Stated Goal: get better  Potential to Achieve Goals: Good Progress towards PT goals: Progressing toward goals    Frequency    7X/week      PT Plan Current plan remains appropriate    Co-evaluation             End of Session Equipment Utilized During Treatment: Gait belt Activity Tolerance: Patient limited by fatigue Patient left: with call bell/phone within reach;in chair;with nursing/sitter in room     Time: 1538-1601 PT Time Calculation (min) (ACUTE ONLY): 23 min  Charges:  $Gait Training:  8-22 mins $Therapeutic Exercise: 8-22 mins                    G Codes:      Cristela Blue 2016-06-05, 4:03 PM  Governor Rooks, PTA pager [phone]

## 2016-05-27 ENCOUNTER — Encounter (HOSPITAL_COMMUNITY): Payer: Self-pay | Admitting: Radiology

## 2016-05-27 ENCOUNTER — Inpatient Hospital Stay (HOSPITAL_COMMUNITY): Payer: Medicare Other

## 2016-05-27 LAB — GLUCOSE, CAPILLARY
GLUCOSE-CAPILLARY: 123 mg/dL — AB (ref 65–99)
Glucose-Capillary: 169 mg/dL — ABNORMAL HIGH (ref 65–99)
Glucose-Capillary: 125 mg/dL — ABNORMAL HIGH (ref 65–99)
Glucose-Capillary: 167 mg/dL — ABNORMAL HIGH (ref 65–99)

## 2016-05-27 LAB — CBC
HCT: 30.9 % — ABNORMAL LOW (ref 39.0–52.0)
Hemoglobin: 10.2 g/dL — ABNORMAL LOW (ref 13.0–17.0)
MCH: 29.6 pg (ref 26.0–34.0)
MCHC: 33 g/dL (ref 30.0–36.0)
MCV: 89.6 fL (ref 78.0–100.0)
PLATELETS: 287 10*3/uL (ref 150–400)
RBC: 3.45 MIL/uL — AB (ref 4.22–5.81)
RDW: 15.4 % (ref 11.5–15.5)
WBC: 12.4 10*3/uL — ABNORMAL HIGH (ref 4.0–10.5)

## 2016-05-27 LAB — BASIC METABOLIC PANEL
Anion gap: 13 (ref 5–15)
BUN: 26 mg/dL — AB (ref 6–20)
CALCIUM: 8.5 mg/dL — AB (ref 8.9–10.3)
CO2: 23 mmol/L (ref 22–32)
CREATININE: 1.05 mg/dL (ref 0.61–1.24)
Chloride: 101 mmol/L (ref 101–111)
GFR calc Af Amer: >60 mL/min (ref >60)
Glucose, Bld: 243 mg/dL — ABNORMAL HIGH (ref 65–99)
POTASSIUM: 4.2 mmol/L (ref 3.5–5.1)
SODIUM: 137 mmol/L (ref 135–145)

## 2016-05-27 NOTE — Progress Notes (Signed)
[id number] pt was up with assistance to the bathroom. On his way, his sock got caught and IV also pulled on him, the tech who was assisting the pt tried to fix these but pt couldn't wait and he stumbled. The tech held the pt, preventing him from falling, and eased him to the floor. Upon assessment, pt had no injuries due to this incident and pt also stated that he did not hurt himself. PA walked in a few minutes after the incident and ordered x-ray of the knee. Will cont to monitor.

## 2016-05-27 NOTE — Progress Notes (Signed)
Physical Therapy Treatment Patient Details Name: Brent Cobb MRN: [id number] DOB: March 12, 1946 Today's Date: 05/27/2016    History of Present Illness 70 y.o. male admitted to [facility] on 05/22/16 for elective L TKA.  Pt with significant PMHx of DM2, aortic valve replacement, chronic diastolic congestive heart failure, L shoulder surgery, cervical spine surgery, and R carpal tunnel release.      PT Comments    Pt presented supine in bed with HOB elevated, awake and willing to participate in therapy session. Pt successfully completed stair training during this session and continuing to make good progress. Pt would continue to benefit from skilled physical therapy services at this time while admitted and after d/c to address his limitations in order to improve his overall safety and independence with functional mobility.   Follow Up Recommendations  Home health PT;Supervision/Assistance - 24 hour     Equipment Recommendations  Rolling walker with 5" wheels;3in1 (PT)    Recommendations for Other Services       Precautions / Restrictions Precautions Precautions: Knee Restrictions Weight Bearing Restrictions: Yes LLE Weight Bearing: Weight bearing as tolerated    Mobility  Bed Mobility Overal bed mobility: Needs Assistance Bed Mobility: Supine to Sit;Sit to Supine     Supine to sit: Supervision;HOB elevated Sit to supine: Supervision   General bed mobility comments: pt required increased time and use of bed rails  Transfers Overall transfer level: Needs assistance Equipment used: Rolling walker (2 wheeled) Transfers: Sit to/from Stand Sit to Stand: Min assist         General transfer comment: pt required min A upon standing with mild LOB  Ambulation/Gait Ambulation/Gait assistance: Min guard Ambulation Distance (Feet): 75 Feet Assistive device: Rolling walker (2 wheeled) Gait Pattern/deviations: Step-to pattern;Step-through pattern;Decreased step length - right;Decreased  stance time - left;Decreased weight shift to left Gait velocity: decreased Gait velocity interpretation: Below normal speed for age/gender General Gait Details: pt required VC'ing for technique and posture   Stairs Stairs: Yes Stairs assistance: Min guard Stair Management: No rails;Step to pattern;Backwards;With walker Number of Stairs: 1 General stair comments: pt ascended with R LE leading and descended with L LE leading  Wheelchair Mobility    Modified Rankin (Stroke Patients Only)       Balance Overall balance assessment: Needs assistance Sitting-balance support: Feet supported;No upper extremity supported Sitting balance-Leahy Scale: Good     Standing balance support: During functional activity;Bilateral upper extremity supported Standing balance-Leahy Scale: Poor Standing balance comment: pt with mild instability in standing requiring min A to recover                    Cognition Arousal/Alertness: Awake/alert Behavior During Therapy: WFL for tasks assessed/performed Overall Cognitive Status: Within Functional Limits for tasks assessed                      Exercises Total Joint Exercises Goniometric ROM: Flexion = 93 degrees; Extension = lacking 15 degrees to neutral; measured in supine    General Comments        Pertinent Vitals/Pain Pain Assessment: Faces Faces Pain Scale: Hurts a little bit Pain Location: L knee Pain Descriptors / Indicators: Sore Pain Intervention(s): Monitored during session;Repositioned    Home Living                      Prior Function            PT Goals (current goals can now be found in  the care plan section) Acute Rehab PT Goals Patient Stated Goal: to return home PT Goal Formulation: With patient/family Time For Goal Achievement: 05/29/16 Potential to Achieve Goals: Good Progress towards PT goals: Progressing toward goals    Frequency    7X/week      PT Plan Current plan remains  appropriate    Co-evaluation             End of Session Equipment Utilized During Treatment: Gait belt Activity Tolerance: Patient limited by fatigue Patient left: in bed;in CPM;with call bell/phone within reach     Time: [id number] PT Time Calculation (min) (ACUTE ONLY): 30 min  Charges:  $Gait Training: 23-37 mins                    G CodesClearnce Sorrel Ansar Skoda 2016-06-03, 1:37 PM Sherie Don, [place], DPT [phone]

## 2016-05-27 NOTE — Care Management (Signed)
Patient has significantly improved per PA and will be able to discharge to home on Sunday. Case manager to arrange for Home CPAP through [facility]. Will send all necessary documentation and confirm that they will be able to deliver to patient's home by Sunday.   Marland Kitchen

## 2016-05-27 NOTE — Progress Notes (Signed)
Physical Therapy Treatment Patient Details Name: Brent Cobb MRN: [id number] DOB: 1946-01-14 Today's Date: 05/27/2016    History of Present Illness 70 y.o. male admitted to [facility] on 05/22/16 for elective L TKA.  Pt with significant PMHx of DM2, aortic valve replacement, chronic diastolic congestive heart failure, L shoulder surgery, cervical spine surgery, and R carpal tunnel release.      PT Comments    Pt presented supine in bed with HOB elevated, awake and willing to participate in therapy session. Pt making good progress; however, was very fatigued after ambulation this session. Fatigue was a limiting factor for him during this session. He continues to move well and was more stable in standing this session. Pt would continue to benefit from skilled physical therapy services at this time while admitted and after d/c to address his limitations in order to improve his overall safety and independence with functional mobility.   Follow Up Recommendations  Home health PT;Supervision/Assistance - 24 hour     Equipment Recommendations  Rolling walker with 5" wheels;3in1 (PT)    Recommendations for Other Services       Precautions / Restrictions Precautions Precautions: Knee Restrictions Weight Bearing Restrictions: Yes LLE Weight Bearing: Weight bearing as tolerated    Mobility  Bed Mobility Overal bed mobility: Needs Assistance Bed Mobility: Supine to Sit     Supine to sit: Supervision;HOB elevated Sit to supine: Supervision   General bed mobility comments: pt required increased time and use of bed rails  Transfers Overall transfer level: Needs assistance Equipment used: Rolling walker (2 wheeled) Transfers: Sit to/from Stand Sit to Stand: Min guard         General transfer comment: pt required increased time and min guard for safety  Ambulation/Gait Ambulation/Gait assistance: Min guard Ambulation Distance (Feet): 150 Feet Assistive device: Rolling walker (2  wheeled) Gait Pattern/deviations: Step-to pattern;Step-through pattern;Decreased step length - right;Decreased stance time - left;Decreased weight shift to left Gait velocity: decreased Gait velocity interpretation: Below normal speed for age/gender General Gait Details: pt required VC'ing for technique and posture   Stairs            Wheelchair Mobility    Modified Rankin (Stroke Patients Only)       Balance Overall balance assessment: Needs assistance Sitting-balance support: Feet supported;Bilateral upper extremity supported Sitting balance-Leahy Scale: Good     Standing balance support: During functional activity;Bilateral upper extremity supported Standing balance-Leahy Scale: Poor Standing balance comment: no instability upon standing this session; however, reliant on bilateral UEs on RW                    Cognition Arousal/Alertness: Awake/alert Behavior During Therapy: WFL for tasks assessed/performed Overall Cognitive Status: Within Functional Limits for tasks assessed                      Exercises      General Comments        Pertinent Vitals/Pain Pain Assessment: Faces Pain Score: 2  Faces Pain Scale: Hurts a little bit Pain Location: L knee Pain Descriptors / Indicators: Sore Pain Intervention(s): Monitored during session;Repositioned    Home Living Family/patient expects to be discharged to:: Private residence Living Arrangements: Spouse/significant other Available Help at Discharge: Other (Comment);Family;Available 24 hours/day (wife can only supervise, not provide assist as she uses cane) Type of Home: House Home Access: Level entry   Home Layout: One level;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, contracting  Prior Function Level of Independence: Independent      Comments: retired   PT Goals (current goals can now be found in the care plan section) Acute Rehab PT Goals Patient Stated Goal: to return  home PT Goal Formulation: With patient/family Time For Goal Achievement: 05/29/16 Potential to Achieve Goals: Good Progress towards PT goals: Progressing toward goals    Frequency    7X/week      PT Plan Current plan remains appropriate    Co-evaluation             End of Session Equipment Utilized During Treatment: Gait belt Activity Tolerance: Patient limited by fatigue Patient left: in chair;with call bell/phone within reach     Time: [id number] PT Time Calculation (min) (ACUTE ONLY): 16 min  Charges:  $Gait Training: 8-22 mins                    G CodesClearnce Sorrel Bellany Elbaum 2016-06-19, 5:31 PM Sherie Don, [place], DPT [phone]

## 2016-05-27 NOTE — Progress Notes (Signed)
Subjective: 5 Days Post-Op Procedure(s) (LRB): TOTAL KNEE ARTHROPLASTY (Left) Patient reports pain as 4 on 0-10 scale.    Objective: Vital signs in last 24 hours: Temp:  [97.6 F (36.4 C)-99 F (37.2 C)] 98.5 F (36.9 C) (09/23 0512) Pulse Rate:  [72-95] 72 (09/23 0512) Resp:  [16] 16 (09/23 0512) BP: (135-149)/(54-65) 149/65 (09/23 0512) SpO2:  [94 %-96 %] 94 % (09/23 0512)  Intake/Output from previous day: 09/22 0701 - 09/23 0700 In: 480 [P.O.:480] Out: 1300 [Urine:1300] Intake/Output this shift: No intake/output data recorded.   Recent Labs  05/25/16 0456 05/26/16 0811  HGB 9.7* 10.8*    Recent Labs  05/25/16 0456 05/26/16 0811  WBC 9.7 13.4*  RBC 3.36* 3.76*  HCT 30.2* 33.3*  PLT 230 222    Recent Labs  05/25/16 0456 05/26/16 0811  NA 136 136  K 5.6* 5.3*  CL 104 103  CO2 23 25  BUN 16 18  CREATININE 1.01 0.94  GLUCOSE 197* 225*  CALCIUM 8.0* 8.6*   No results for input(s): LABPT, INR in the last 72 hours.  Neurovascular intact Sensation intact distally Intact pulses distally Dorsiflexion/Plantar flexion intact Incision: scant drainage  Assessment/Plan: 5 Days Post-Op Procedure(s) (LRB): TOTAL KNEE ARTHROPLASTY (Left)  Active Problems:   Type II diabetes mellitus (HCC)   Chronic diastolic congestive heart failure (HCC)   Obstructive sleep apnea   S/P aortic valve replacement with bioprosthetic valve   Primary localized osteoarthritis of left knee   Primary localized osteoarthrosis of the knee  Up with therapy Plan for discharge tomorrow  This am when getting up to the bathroom with the assistance of a tech this patient's sock got caught under his walker and stumbled.  The tech caught him and lowered slowly to the ground.  Patient states he is doing fine post fall with no new pain.  The tech hurt herself catching such a big patient.  There is no new drainage on the surgical aquacel dressing.  Patient was able to ambulate almost to the  nursing station and back after his fall.  Will get knee xray to confirm that prosthesis does not have any issue.  Will plan on discharge tomorrow if no other signs of instability.  Patient is using CPAP at night and is significantly better.  Will need to make sure all is arranged to get this for the patient prior to discharge.  Diabetes is stable on low dose steroid  Jamell Laymon J 05/27/2016, 7:45 AM

## 2016-05-27 NOTE — Progress Notes (Signed)
Orthopedic Tech Progress Note Patient Details:  Brent Cobb 1945-10-04 [id number]  CPM Left Knee CPM Left Knee: On Left Knee Flexion (Degrees): 80 Left Knee Extension (Degrees): 0 Additional Comments: Trapeze bar foot roll   Brent Cobb 05/27/2016, 2:37 PM

## 2016-05-27 NOTE — Care Management (Signed)
Patient will need CPAP for home. Case manager contacted [facility], provider that patient uses in [place]. CM spoke with Maudie Mercury, on call RN and was informed that [facility] cannot provide CPAP over the weekend, will have to be delivered on Monday. Case manager contacted Joycelyn Schmid. PA to inform her of this. CM  Will contact [facility] to see if they can accommodate CPAP need.

## 2016-05-27 NOTE — Evaluation (Signed)
Occupational Therapy Evaluation Patient Details Name: Brent Cobb MRN: [id number] DOB: July 04, 1946 Today's Date: 05/27/2016    History of Present Illness 70 y.o. male admitted to [facility] on 05/22/16 for elective L TKA.  Pt with significant PMHx of DM2, aortic valve replacement, chronic diastolic congestive heart failure, L shoulder surgery, cervical spine surgery, and R carpal tunnel release.     Clinical Impression   PTA Pt independent in ADL and IADL. Pt with below deficits impacting independence and safety. Pt able to reach feet for LB dressing/bathing when in sitting. Pt able to sit to complete grooming. Pt would benefit from skilled OT intervention to increased independence in ADLs as wife cannot provide physical support, but is able to help with non-physical tasks, like getting socks on. Next session to focus on tub transfer (Pt has a shower seat at home).    Follow Up Recommendations  No OT follow up;Supervision/Assistance - 24 hour    Equipment Recommendations  3 in 1 bedside comode    Recommendations for Other Services       Precautions / Restrictions Precautions Precautions: Knee Restrictions Weight Bearing Restrictions: Yes LLE Weight Bearing: Weight bearing as tolerated      Mobility Bed Mobility Overal bed mobility: Needs Assistance Bed Mobility: Supine to Sit;Sit to Supine     Supine to sit: Supervision;HOB elevated Sit to supine: Supervision   General bed mobility comments: pt required increased time and use of bed rails  Transfers Overall transfer level: Needs assistance Equipment used: Rolling walker (2 wheeled) Transfers: Sit to/from Stand Sit to Stand: Min assist         General transfer comment: Min to stand, vc for safe hand placement    Balance Overall balance assessment: Needs assistance Sitting-balance support: Feet supported;No upper extremity supported Sitting balance-Leahy Scale: Good     Standing balance support: During functional  activity;Bilateral upper extremity supported Standing balance-Leahy Scale: Poor Standing balance comment: pt with mild instability in standing requiring min A to recover                            ADL Overall ADL's : Needs assistance/impaired Eating/Feeding: Sitting;Modified independent   Grooming: Wash/dry hands;Wash/dry face;Oral care;Set up;Sitting Grooming Details (indicate cue type and reason): sitting at sink Upper Body Bathing: Modified independent   Lower Body Bathing: Set up;Sit to/from stand Lower Body Bathing Details (indicate cue type and reason): educated on long handle sponge Upper Body Dressing : Modified independent   Lower Body Dressing: Minimal assistance;With caregiver independent assisting;Sit to/from stand Lower Body Dressing Details (indicate cue type and reason): was fully dressed, Pt reported that his wife helped him. Could reach to both feet to don/doff socks Toilet Transfer: Min guard   Toileting- Clothing Manipulation and Hygiene: Min guard       Functional mobility during ADLs: Min guard       Vision     Perception     Praxis      Pertinent Vitals/Pain Pain Assessment: 0-10 Pain Score: 2  Faces Pain Scale: Hurts a little bit Pain Location: L knee Pain Descriptors / Indicators: Sore Pain Intervention(s): Monitored during session     Hand Dominance Right   Extremity/Trunk Assessment Upper Extremity Assessment Upper Extremity Assessment: Overall WFL for tasks assessed   Lower Extremity Assessment Lower Extremity Assessment: LLE deficits/detail LLE Deficits / Details: LLE with normal decreased strength and ROM post op       Communication Communication  Communication: HOH   Cognition Arousal/Alertness: Awake/alert Behavior During Therapy: WFL for tasks assessed/performed Overall Cognitive Status: Within Functional Limits for tasks assessed                     General Comments       Exercises       Shoulder  Instructions      Home Living Family/patient expects to be discharged to:: Private residence Living Arrangements: Spouse/significant other Available Help at Discharge: Other (Comment);Family;Available 24 hours/day (wife can only supervise, not provide assist as she uses cane) Type of Home: House Home Access: Level entry     Home Layout: One level;Laundry or work area in basement     [employer] Shower/Tub: Risk analyst characteristics: Architectural technologist: Programmer, systems: Yes How Accessible: Accessible via walker Home Equipment: Shower seat          Prior Functioning/Environment Level of Independence: Independent        Comments: retired        OT Problem List: Decreased strength;Decreased range of motion;Decreased activity tolerance;Impaired balance (sitting and/or standing);Decreased knowledge of use of DME or AE;Pain   OT Treatment/Interventions: Self-care/ADL training;DME and/or AE instruction;Therapeutic activities;Patient/family education    OT Goals(Current goals can be found in the care plan section) Acute Rehab OT Goals Patient Stated Goal: to return home OT Goal Formulation: With patient Time For Goal Achievement: 06/03/16 Potential to Achieve Goals: Good ADL Goals Pt Will Transfer to Toilet: with supervision;ambulating;bedside commode Pt Will Perform Toileting - Clothing Manipulation and hygiene: with modified independence;sit to/from stand Pt Will Perform Tub/Shower Transfer: with supervision;ambulating;3 in 1;rolling walker  OT Frequency: Min 2X/week   Barriers to D/C: Decreased caregiver support (wife unable to physically assist, can help with dressing)          Co-evaluation              End of Session Equipment Utilized During Treatment: Gait belt;Rolling walker CPM Left Knee CPM Left Knee: Off Nurse Communication: Mobility status  Activity Tolerance: Patient tolerated treatment well Patient left: with  call bell/phone within reach;in bed   Time: [id number] OT Time Calculation (min): 20 min Charges:  OT General Charges $OT Visit: 1 Procedure OT Evaluation $OT Eval Low Complexity: 1 Procedure G-Codes:    Merri Ray Kamon Fahr June 13, 2016, 4:15 PM

## 2016-05-28 LAB — GLUCOSE, CAPILLARY
GLUCOSE-CAPILLARY: 200 mg/dL — AB (ref 65–99)
GLUCOSE-CAPILLARY: 160 mg/dL — AB (ref 65–99)
Glucose-Capillary: 222 mg/dL — ABNORMAL HIGH (ref 65–99)
Glucose-Capillary: 242 mg/dL — ABNORMAL HIGH (ref 65–99)
Glucose-Capillary: 226 mg/dL — ABNORMAL HIGH (ref 65–99)

## 2016-05-28 MED ORDER — INSULIN NPH (HUMAN) (ISOPHANE) 100 UNIT/ML ~~LOC~~ SUSP
50.0000 [IU] | Freq: Two times a day (BID) | SUBCUTANEOUS | Status: DC
  Administered 2016-05-28 – 2016-05-29 (×3): 50 [IU] via SUBCUTANEOUS
  Filled 2016-05-28: qty 10

## 2016-05-28 MED ORDER — DEXAMETHASONE 4 MG PO TABS
2.0000 mg | ORAL_TABLET | Freq: Two times a day (BID) | ORAL | Status: DC
  Administered 2016-05-28 – 2016-05-29 (×3): 2 mg via ORAL
  Filled 2016-05-28 (×3): qty 1

## 2016-05-28 NOTE — Plan of Care (Signed)
Problem: Acute Rehab OT Goals (only OT should resolve) Goal: Pt. Will Perform Tub/Shower Transfer Outcome: Not Met (add Reason) Pt. Unable to complete tub transfer at this time and reports he will sponge bathe initially.  Education provided on not attempting this transfer without assistance.  Pt. Verbalized understanding.

## 2016-05-28 NOTE — Progress Notes (Signed)
Physical Therapy Treatment Patient Details Name: Brent Cobb MRN: [id number] DOB: February 21, 1946 Today's Date: 05/28/2016    History of Present Illness 70 y.o. male admitted to [facility] on 05/22/16 for elective L TKA.  Pt with significant PMHx of DM2, aortic valve replacement, chronic diastolic congestive heart failure, L shoulder surgery, cervical spine surgery, and R carpal tunnel release.      PT Comments    Pt doing well.  Progressing daily.  Pt encouraged to continue his HEP and use ice after exercising.  Follow Up Recommendations  Home health PT;Supervision for mobility/OOB     Equipment Recommendations  Rolling walker with 5" wheels;3in1 (PT)    Recommendations for Other Services       Precautions / Restrictions Precautions Precautions: Knee Restrictions LLE Weight Bearing: Weight bearing as tolerated    Mobility  Bed Mobility                  Transfers Overall transfer level: Modified independent Equipment used: Rolling walker (2 wheeled)             General transfer comment: pt cued to reach back with both hands to sit slowly - otherwise no assist needed  Ambulation/Gait Ambulation/Gait assistance: Min guard Ambulation Distance (Feet): 200 Feet Assistive device: Rolling walker (2 wheeled) Gait Pattern/deviations: Step-through pattern;Decreased weight shift to left     General Gait Details: pt did well today.  Cued him to work on less weight on RW when walking.     Stairs       Number of Stairs: 1 General stair comments: good technque for up and down one step with RW - no verbal cues needed  Wheelchair Mobility    Modified Rankin (Stroke Patients Only)       Balance                                    Cognition Arousal/Alertness: Awake/alert Behavior During Therapy: WFL for tasks assessed/performed Overall Cognitive Status: Within Functional Limits for tasks assessed                      Exercises Total Joint  Exercises Quad Sets: AROM;Left (with foot on chair to allow full extension) Long Arc Quad: AROM;15 reps;Left Knee Flexion: AAROM;Left (pt used good leg to overpressure the left for stretch x 3) Goniometric ROM: pt measured 95 degrees flexion and 8 degrees from full extension - measured in sitting    General Comments General comments (skin integrity, edema, etc.): pt able to show me his HEP - pt doing well on his own.      Pertinent Vitals/Pain Pain Assessment: 0-10 Pain Score: 6  (with exercise and activity) Pain Location: L Knee Pain Intervention(s): Monitored during session;Repositioned;Ice applied    Home Living                      Prior Function            PT Goals (current goals can now be found in the care plan section) Progress towards PT goals: Progressing toward goals    Frequency    7X/week      PT Plan Current plan remains appropriate    Co-evaluation             End of Session Equipment Utilized During Treatment: Gait belt Activity Tolerance: Patient limited by fatigue Patient left: in chair;with call bell/phone  within reach     Time: 0845-0910 PT Time Calculation (min) (ACUTE ONLY): 25 min  Charges:  $Gait Training: 8-22 mins $Therapeutic Exercise: 8-22 mins                    G Codes:      [name] 05/28/2016, 9:28 AM 05/28/2016   [name], PT

## 2016-05-28 NOTE — Progress Notes (Signed)
Occupational Therapy Treatment Patient Details Name: Brent Cobb MRN: [id number] DOB: September 27, 1945 Today's Date: 05/28/2016    History of present illness 70 y.o. male admitted to [facility] on 05/22/16 for elective L TKA.  Pt with significant PMHx of DM2, aortic valve replacement, chronic diastolic congestive heart failure, L shoulder surgery, cervical spine surgery, and R carpal tunnel release.     OT comments  Pt. Able to complete toileting tasks with S.  Tub transfer goal deferred to sponge bathing and wife will assist with LB ADLS prn.  Clear for d/c from OT standpoint.  OTR/L to sign off.   Follow Up Recommendations  No OT follow up;Supervision/Assistance - 24 hour    Equipment Recommendations  3 in 1 bedside comode    Recommendations for Other Services      Precautions / Restrictions Precautions Precautions: Knee Restrictions LLE Weight Bearing: Weight bearing as tolerated       Mobility Bed Mobility               General bed mobility comments: pt. seated in recliner upon therapist asst. arrival into room  Transfers Overall transfer level: Modified independent Equipment used: Rolling walker (2 wheeled) Transfers: Sit to/from [facility] Sit to Stand: Modified independent (Device/Increase time) Stand pivot transfers: Modified independent (Device/Increase time)       General transfer comment: pt cued to reach back with both hands to sit slowly - otherwise no assist needed    Balance                                   ADL Overall ADL's : Needs assistance/impaired               Lower Body Bathing Details (indicate cue type and reason): reports wife will assist with LB ADLS as needed       Lower Body Dressing Details (indicate cue type and reason): reports wife will assist with LB ADLS as needed Toilet Transfer: Min Marine scientist Details (indicate cue type and reason): simulated during functional mobility  and transfers during OT session Toileting- Clothing Manipulation and Hygiene: Sitting/lateral lean;Min guard Toileting - Clothing Manipulation Details (indicate cue type and reason): simulated during functional mobility and transfers during OT session Tub/ Shower Transfer: Tub transfer;Rolling walker;Ambulation;Shower Scientist, research (medical) Details (indicate cue type and reason): attempted side step tub transfer for tub with L faucet and reported shower doors, unable to bear weight on LLE to attempt side step over the tub.  pt. states "its just not going to work".  reviewed options for sponge bathing initially until he feels stronger.  he verbalized understanding and agreed.     General ADL Comments: pt. unable to complete tub transfer, will sponge bathe initially until he "feels stronger" educated on tech. and holding onto wall and walker vs shower rod as he reports he does. also indicated "ive got knobs and all sorts of things i can hold onto" educated and urged not holding onto items that can break.  verbalized understanding but continued to describe it as "a third point of balance" i dont really use it its just there for me.      Vision                     Perception     Praxis      Cognition   Behavior During Therapy: [facility] for tasks assessed/performed Overall Cognitive  Status: Within Functional Limits for tasks assessed                       Extremity/Trunk Assessment               Exercises Total Joint Exercises Quad Sets: AROM;Left (with foot on chair to allow full extension) Long Arc Quad: AROM;15 reps;Left Knee Flexion: AAROM;Left (pt used good leg to overpressure the left for stretch x 3) Goniometric ROM: pt measured 95 degrees flexion and 8 degrees from full extension - measured in sitting   Shoulder Instructions       General Comments      Pertinent Vitals/ Pain       Pain Assessment: No/denies pain Pain Score: 6  (with exercise and  activity) Pain Location: L Knee Pain Intervention(s): Monitored during session;Repositioned;Ice applied  Home Living                                          Prior Functioning/Environment              Frequency  Min 2X/week        Progress Toward Goals  OT Goals(current goals can now be found in the care plan section)  Progress towards OT goals: Goals met/education completed, patient discharged from [place] Discharge plan remains appropriate    Co-evaluation                 End of Session Equipment Utilized During Treatment: Gait belt;Rolling walker   Activity Tolerance Patient tolerated treatment well   Patient Left in chair;with call bell/phone within reach   Nurse Communication          Time: 3300-7622 OT Time Calculation (min): 20 min  Charges: OT General Charges $OT Visit: 1 Procedure OT Treatments $Self Care/Home Management : 8-22 mins  Janice Coffin, COTA/L 05/28/2016, 10:54 AM

## 2016-05-28 NOTE — Plan of Care (Signed)
Problem: Bowel/Gastric: Goal: Will not experience complications related to bowel motility Outcome: Progressing No bowel issues reported  Problem: Activity: Goal: Will remain free from falls Outcome: Progressing No fall or injury reported  Problem: Physical Regulation: Goal: Postoperative complications will be avoided or minimized Outcome: Progressing No post op. Complications noted

## 2016-05-28 NOTE — Progress Notes (Signed)
Subjective: 6 Days Post-Op Procedure(s) (LRB): TOTAL KNEE ARTHROPLASTY (Left) Patient reports pain as 4 on 0-10 scale.    Objective: Vital signs in last 24 hours: Temp:  [98.1 F (36.7 C)-98.6 F (37 C)] 98.1 F (36.7 C) (09/24 0450) Pulse Rate:  [70-90] 70 (09/24 0450) Resp:  [15-18] 18 (09/24 0450) BP: (105-149)/(48-68) 149/55 (09/24 0450) SpO2:  [94 %-96 %] 96 % (09/24 0450)  Intake/Output from previous day: 09/23 0701 - 09/24 0700 In: 960 [P.O.:960] Out: 1575 [Urine:1575] Intake/Output this shift: No intake/output data recorded.   Recent Labs  05/26/16 0811 05/27/16 0824  HGB 10.8* 10.2*    Recent Labs  05/26/16 0811 05/27/16 0824  WBC 13.4* 12.4*  RBC 3.76* 3.45*  HCT 33.3* 30.9*  PLT 222 287    Recent Labs  05/26/16 0811 05/27/16 0824  NA 136 137  K 5.3* 4.2  CL 103 101  CO2 25 23  BUN 18 26*  CREATININE 0.94 1.05  GLUCOSE 225* 243*  CALCIUM 8.6* 8.5*   No results for input(s): LABPT, INR in the last 72 hours.  ABD soft Neurovascular intact Sensation intact distally Intact pulses distally Incision: dressing C/D/I and scant drainage  Assessment/Plan: 6 Days Post-Op Procedure(s) (LRB): TOTAL KNEE ARTHROPLASTY (Left) Advance diet Up with therapy Plan for discharge tomorrow Discharge home with home health  Patient's sugars are a little high in reaction to the low dose decadron.  Low dose decadron is controlling his pain and making his narcotic usage much lower so is medically necessary.  Will increase his NPH to adjust for this medication.  He is out of bed with supervision and the use of a walker    He is still somewhat unsteady but much improved from the end of this past week.  He is being kept in the hospital because we cannot get a CPAP for home use until Monday   AM   Manuela Schwartz, care manager will fax orders to [facility] and will make sure it is delivered on day of discharge.  Continue PT for endurance  Brent Cobb 05/28/2016, 8:11  AM

## 2016-05-28 NOTE — Progress Notes (Signed)
Orthopedic Tech Progress Note Patient Details:  Brent Cobb February 03, 1946 [id number]  Patient ID: Brent Cobb, male   DOB: Jan 08, 1946, 70 y.o.   MRN: [id number] Applied cpm 0-95. Pt was already at this degree.  Brent Cobb 05/28/2016, 6:11 AM

## 2016-05-29 LAB — CBC
HEMATOCRIT: 33.4 % — AB (ref 39.0–52.0)
HEMOGLOBIN: 10.9 g/dL — AB (ref 13.0–17.0)
MCH: 29 pg (ref 26.0–34.0)
MCHC: 32.6 g/dL (ref 30.0–36.0)
MCV: 88.8 fL (ref 78.0–100.0)
Platelets: 301 10*3/uL (ref 150–400)
RBC: 3.76 MIL/uL — AB (ref 4.22–5.81)
RDW: 15.4 % (ref 11.5–15.5)
WBC: 14.7 10*3/uL — ABNORMAL HIGH (ref 4.0–10.5)

## 2016-05-29 LAB — COMPREHENSIVE METABOLIC PANEL
ALK PHOS: 93 U/L (ref 38–126)
ALT: 24 U/L (ref 17–63)
ANION GAP: 8 (ref 5–15)
AST: 27 U/L (ref 15–41)
Albumin: 3.2 g/dL — ABNORMAL LOW (ref 3.5–5.0)
BILIRUBIN TOTAL: 0.7 mg/dL (ref 0.3–1.2)
BUN: 22 mg/dL — ABNORMAL HIGH (ref 6–20)
CALCIUM: 8.9 mg/dL (ref 8.9–10.3)
CO2: 26 mmol/L (ref 22–32)
CREATININE: 1.11 mg/dL (ref 0.61–1.24)
Chloride: 105 mmol/L (ref 101–111)
GFR calc non Af Amer: >60 mL/min (ref >60)
Glucose, Bld: 180 mg/dL — ABNORMAL HIGH (ref 65–99)
Potassium: 4.5 mmol/L (ref 3.5–5.1)
SODIUM: 139 mmol/L (ref 135–145)
TOTAL PROTEIN: 6.4 g/dL — AB (ref 6.5–8.1)

## 2016-05-29 LAB — GLUCOSE, CAPILLARY: Glucose-Capillary: 175 mg/dL — ABNORMAL HIGH (ref 65–99)

## 2016-05-29 MED ORDER — ACETAMINOPHEN ER 650 MG PO TBCR: 1300.0000 mg | EXTENDED_RELEASE_TABLET | Freq: Four times a day (QID) | ORAL | Status: AC

## 2016-05-29 MED ORDER — DOCUSATE SODIUM 100 MG PO CAPS: ORAL_CAPSULE | ORAL | 0 refills | Status: DC

## 2016-05-29 MED ORDER — VITAMIN D 1000 UNITS PO TABS: 2000.0000 [IU] | ORAL_TABLET | Freq: Every day | ORAL | 3 refills | Status: DC

## 2016-05-29 MED ORDER — OXYCODONE HCL 5 MG PO TABS: ORAL_TABLET | ORAL | 0 refills | Status: DC

## 2016-05-29 MED ORDER — INSULIN NPH (HUMAN) (ISOPHANE) 100 UNIT/ML ~~LOC~~ SUSP: 35.0000 [IU] | Freq: Three times a day (TID) | SUBCUTANEOUS | 11 refills | Status: DC

## 2016-05-29 MED ORDER — DEXAMETHASONE 2 MG PO TABS: ORAL_TABLET | ORAL | 0 refills | Status: DC

## 2016-05-29 MED ORDER — POLYETHYLENE GLYCOL 3350 17 G PO PACK: PACK | ORAL | 0 refills | Status: DC

## 2016-05-29 MED ORDER — ASPIRIN 325 MG PO TABS: 325.0000 mg | ORAL_TABLET | Freq: Every day | ORAL | Status: DC

## 2016-05-29 NOTE — Progress Notes (Signed)
Patient discharged to home, discharge instructions given to patient and wife, they stated they understood

## 2016-05-29 NOTE — Progress Notes (Signed)
Physical Therapy Treatment Patient Details Name: Brent Cobb MRN: [id number] DOB: Dec 19, 1945 Today's Date: 05/29/2016    History of Present Illness 70 y.o. male admitted to [facility] on 05/22/16 for elective L TKA.  Pt with significant PMHx of DM2, aortic valve replacement, chronic diastolic congestive heart failure, L shoulder surgery, cervical spine surgery, and R carpal tunnel release.      PT Comments    Pt performed increased activity with decreased assist.  Pt ready for d/c home from a mobility standpoint.  PTA reviewed HEP with patient for continued carryover at home.    Follow Up Recommendations  Home health PT;Supervision for mobility/OOB     Equipment Recommendations  Rolling walker with 5" wheels;3in1 (PT)    Recommendations for Other Services       Precautions / Restrictions Precautions Precautions: Knee Restrictions Weight Bearing Restrictions: No LLE Weight Bearing: Weight bearing as tolerated    Mobility  Bed Mobility               General bed mobility comments: Pt received in recliner on arrival.  Transfers Overall transfer level: Needs assistance Equipment used: Rolling walker (2 wheeled) Transfers: Sit to/from Stand Sit to Stand: Supervision         General transfer comment: pt cued to reach back with both hands to sit slowly - otherwise no assist needed.  Pt remains to present with poor eccentric loading.    Ambulation/Gait Ambulation/Gait assistance: Supervision Ambulation Distance (Feet): 200 Feet Assistive device: Rolling walker (2 wheeled) Gait Pattern/deviations: Step-through pattern;Antalgic;Trunk flexed   Gait velocity interpretation: Below normal speed for age/gender General Gait Details: Cues for posture and L heel strike.  No buckling.  Endurance improved.     Stairs Stairs:  (level entry into home.  )          Wheelchair Mobility    Modified Rankin (Stroke Patients Only)       Balance Overall balance assessment:  Needs assistance   Sitting balance-Leahy Scale: Good       Standing balance-Leahy Scale: Fair                      Cognition Arousal/Alertness: Awake/alert Behavior During Therapy: WFL for tasks assessed/performed Overall Cognitive Status: Within Functional Limits for tasks assessed                      Exercises Total Joint Exercises Ankle Circles/Pumps: AROM;Both;10 reps;Supine Quad Sets: AROM;Left;10 reps;Supine Towel Squeeze: AROM;Both;10 reps;Supine Short Arc Quad: AROM;Left;10 reps;Supine Heel Slides: AROM;Left;10 reps;Supine Hip ABduction/ADduction: AROM;Left;10 reps;Supine Straight Leg Raises: AAROM;Left;10 reps;Supine Long Arc Quad: AROM;Left;10 reps;Seated Goniometric ROM: 90 degrees flexion L knee.      General Comments        Pertinent Vitals/Pain Pain Assessment: 0-10 Pain Score: 4  Pain Location: L knee with flexion Pain Descriptors / Indicators: Tightness;Sore Pain Intervention(s): Repositioned    Home Living                      Prior Function            PT Goals (current goals can now be found in the care plan section) Acute Rehab PT Goals Patient Stated Goal: to return home Potential to Achieve Goals: Good Progress towards PT goals: Progressing toward goals    Frequency    7X/week      PT Plan Current plan remains appropriate    Co-evaluation  End of Session Equipment Utilized During Treatment: Gait belt Activity Tolerance: Patient limited by fatigue Patient left: in chair;with call bell/phone within reach     Time: 0928-0950 PT Time Calculation (min) (ACUTE ONLY): 22 min  Charges:  $Therapeutic Exercise: 8-22 mins                    G Codes:      Cristela Blue 06-28-2016, 9:56 AM  Governor Rooks, PTA pager [phone]

## 2016-05-29 NOTE — Progress Notes (Signed)
Orthopedic Tech Progress Note Patient Details:  Brent Cobb 1945-12-17 [id number]  Patient ID: Brent Cobb, male   DOB: [birth year], 70 y.o.   MRN: [id number] Applied cpm 0-95  Karolee Stamps 05/29/2016, 6:08 AM

## 2016-05-29 NOTE — Care Management (Signed)
Case manager spoke with patient and wife concerning CPAP, explained that all information has been sent to [facility] and the nurse will contact them. CM also spoke with [place] liaison to confirm orders, informed her that patient's wife plans to stop at [place] on the way home.

## 2016-05-31 NOTE — Discharge Summary (Signed)
Patient ID: Brent Cobb MRN: [id number] DOB/AGE: [birth year] 72 y.o.  Admit date: 05/22/2016 Discharge date: 05/29/2016  Admission Diagnoses:  Active Problems:   Type II diabetes mellitus (HCC)   Chronic diastolic congestive heart failure (HCC)   Obstructive sleep apnea   S/P aortic valve replacement with bioprosthetic valve   Primary localized osteoarthritis of left knee   Primary localized osteoarthrosis of the knee   Discharge Diagnoses:  Same  Past Medical History:  Diagnosis Date  . Arthritis   . Asthma    as a child  . Cancer ([place])    skin cancer on face  . Chronic diastolic congestive heart failure (HCC)    NYHA functional class II, pt unsure of whether he has this  . Diabetes mellitus ([place])   . Frequency of urination   . Heart murmur   . Obesity   . Obstructive sleep apnea    Intolerant of CPAP  . Primary localized osteoarthritis of left knee   . Radial nerve injury    right  . 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 Severe aortic stenosis 07/21/2015  . Type II diabetes mellitus (HCC)    Insulin-dependent  . Varicose veins     Surgeries: Procedure(s): TOTAL KNEE ARTHROPLASTY on 05/22/2016   Consultants:   Discharged Condition: Improved  Hospital Course: Brent Cobb is an 70 y.o. male who was admitted 05/22/2016 for operative treatment of<principal problem not specified>. Patient has severe unremitting pain that affects sleep, daily activities, and work/hobbies. After pre-op clearance the patient was taken to the operating room on 05/22/2016 and underwent  Procedure(s): TOTAL KNEE ARTHROPLASTY.    Patient was given perioperative antibiotics: Anti-infectives    Start     Dose/Rate Route Frequency Ordered Stop   05/22/16 1345  ceFAZolin (ANCEF) IVPB 2g/100 mL premix  Status:  Discontinued     2 g 200 mL/hr over 30 Minutes Intravenous Every 6 hours  05/22/16 1336 05/22/16 1347   05/22/16 1345  cefTRIAXone (ROCEPHIN) 1 g in dextrose 5 % 50 mL IVPB  Status:  Discontinued     1 g 100 mL/hr over 30 Minutes Intravenous Every 24 hours 05/22/16 1336 05/29/16 1409   05/22/16 0830  ceFAZolin (ANCEF) 3 g in dextrose 5 % 50 mL IVPB     3 g 130 mL/hr over 30 Minutes Intravenous To [facility] 05/19/16 1311 05/22/16 [id number]       Patient was given sequential compression devices, early ambulation, and chemoprophylaxis to prevent DVT.  Post op day 0 and day 1 patient had CBGs in the 400s.  I consulted the diabetic teaching service for management of this patient.  As his sugars improved, his physical function decrease by so much that he could not stand with even 2+ assist on Wednesday.  His O2 sats dropped into the 60's with standing or walking.  Wednesday  Decadron was restarted.  O2 was restarted and CPAP was used by the patient with much convincing.  He had know sleep apnea but had not used a CPAP in over 2 years.  Within the next 2 days this patient improved significantly.  By Saturday he was able to get up from a chair and walk to the bathroom.  By Sunday he could walk into the hall and maintain his Sats.  He was discharged on Monday once a home CPAP unit could be obtained and his sugars and sats were under control.  Adjustments were made to his NPH to account for the low dose oral steroid he was being discharged with.    Patient benefited maximally from hospital stay.Marland Kitchen    Recent vital signs: No data found.    Recent laboratory studies:  Recent Labs  05/29/16 0848  WBC 14.7*  HGB 10.9*  HCT 33.4*  PLT 301  NA 139  K 4.5  CL 105  CO2 26  BUN 22*  CREATININE 1.11  GLUCOSE 180*  CALCIUM 8.9     Discharge Medications:     Medication List    STOP taking these medications   meloxicam 7.5 MG tablet Commonly known as:  MOBIC   sulfamethoxazole-trimethoprim 400-80 MG tablet Commonly known as:  BACTRIM,SEPTRA     TAKE these  medications   acetaminophen 650 MG CR tablet Commonly known as:  TYLENOL Take 2 tablets (1,300 mg total) by mouth every 6 (six) hours. What changed:  when to take this  reasons to take this   aspirin 325 MG tablet Take 1 tablet (325 mg total) by mouth daily with breakfast. What changed:  when to take this   cholecalciferol 1000 units tablet Commonly known as:  VITAMIN D Take 2 tablets (2,000 Units total) by mouth daily. What changed:  how much to take   dexamethasone 2 MG tablet Commonly known as:  DECADRON 1 tablet twice a day for pain and inflammation   docusate sodium 100 MG capsule Commonly known as:  COLACE 1 tab 2 times a day while on narcotics.  STOOL SOFTENER   ezetimibe 10 MG tablet Commonly known as:  ZETIA Take 1 tablet (10 mg total) by mouth daily.   insulin NPH Human 100 UNIT/ML injection Commonly known as:  HUMULIN N,NOVOLIN N Inject 0.35 mLs (35 Units total) into the skin 3 (three) times daily with meals. What changed:  how much to take   metoprolol tartrate 25 MG tablet Commonly known as:  LOPRESSOR Take 1 tablet (25 mg total) by mouth 2 (two) times daily.   oxyCODONE 5 MG immediate release tablet Commonly known as:  Oxy IR/ROXICODONE 1 tablet every 4-6 hrs as needed for pain   polyethylene glycol packet Commonly known as:  MIRALAX / GLYCOLAX 17grams in 6 oz of water twice a day until bowel movement.  LAXITIVE.  Restart if two days since last bowel movement   potassium chloride SA 20 MEQ tablet Commonly known as:  K-DUR,KLOR-CON Take 1 tablet (20 mEq total) by mouth daily.   sitaGLIPtin-metformin 50-1000 MG tablet Commonly known as:  JANUMET Take 1 tablet by mouth 2 (two) times daily with a meal.   tamsulosin 0.4 MG Caps capsule Commonly known as:  FLOMAX Take 0.4 mg by mouth daily.   VICTOZA 18 MG/3ML Sopn Generic drug:  Liraglutide Inject 1.2 mg into the skin daily.   vitamin E 400 UNIT capsule Take 400 Units by mouth daily.        Diagnostic Studies: Dg Knee Ap/lat W/sunrise Left  Result Date: 05/27/2016 CLINICAL DATA:  Patient fell after total knee arthroplasty performed on 05/22/2016. Unable to stand comfortably. EXAM: LEFT KNEE 3 VIEWS COMPARISON:  None. FINDINGS: There is an intact total knee arthroplasty without evidence of hardware failure. There is mild soft tissue induration about the knee likely postoperative in etiology. Soft tissue haziness in the suprapatellar compartment suggesting probable small joint effusion. IMPRESSION: No acute osseous abnormality post total knee arthroplasty. Electronically Signed   By: Ashley Royalty M.D.   On: 05/27/2016  10:08    Disposition: 01-Home or Self Care  Discharge Instructions    CPM    Complete by:  As directed    Continuous passive motion machine (CPM):      Use the CPM from 0 to 90 for 6 hours per day.       You may break it up into 2 or 3 sessions per day.      Use CPM for 2 weeks or until you are told to stop.   Call MD / Call 911    Complete by:  As directed    If you experience chest pain or shortness of breath, CALL 911 and be transported to the hospital emergency room.  If you develope a fever above 101 F, pus (white drainage) or increased drainage or redness at the wound, or calf pain, call your surgeon's office.   Change dressing    Complete by:  As directed    Change the gauze dressing daily with sterile 4 x 4 inch gauze and apply TED hose.  DO NOT REMOVE BANDAGE OVER SURGICAL INCISION.  [place] WHOLE LEG INCLUDING OVER THE WATERPROOF BANDAGE WITH SOAP AND WATER EVERY DAY.   Constipation Prevention    Complete by:  As directed    Drink plenty of fluids.  Prune juice may be helpful.  You may use a stool softener, such as Colace (over the counter) 100 mg twice a day.  Use MiraLax (over the counter) for constipation as needed.   Diet - low sodium heart healthy    Complete by:  As directed    Discharge instructions    Complete by:  As directed    INSTRUCTIONS  AFTER JOINT REPLACEMENT   Remove items at home which could result in a fall. This includes throw rugs or furniture in walking pathways ICE to the affected joint every three hours while awake for 30 minutes at a time, for at least the first 3-5 days, and then as needed for pain and swelling.  Continue to use ice for pain and swelling. You may notice swelling that will progress down to the foot and ankle.  This is normal after surgery.  Elevate your leg when you are not up walking on it.   Continue to use the breathing machine you got in the hospital (incentive spirometer) which will help keep your temperature down.  It is common for your temperature to cycle up and down following surgery, especially at night when you are not up moving around and exerting yourself.  The breathing machine keeps your lungs expanded and your temperature down.   DIET:  As you were doing prior to hospitalization, we recommend a well-balanced diet.  DRESSING / WOUND CARE / SHOWERING  Keep the surgical dressing until follow up.  The dressing is water proof, so you can shower without any extra covering.  IF THE DRESSING FALLS OFF or the wound gets wet inside, change the dressing with sterile gauze.  Please use good hand washing techniques before changing the dressing.  Do not use any lotions or creams on the incision until instructed by your surgeon.    ACTIVITY  Increase activity slowly as tolerated, but follow the weight bearing instructions below.   No driving for 6 weeks or until further direction given by your physician.  You cannot drive while taking narcotics.  No lifting or carrying greater than 10 lbs. until further directed by your surgeon. Avoid periods of inactivity such as sitting longer than an hour  when not asleep. This helps prevent blood clots.  You may return to work once you are authorized by your doctor.     WEIGHT BEARING   Weight bearing as tolerated with assist device (walker, cane, etc) as  directed, use it as long as suggested by your surgeon or therapist, typically at least 2-3 weeks.   EXERCISES  Results after joint replacement surgery are often greatly improved when you follow the exercise, range of motion and muscle strengthening exercises prescribed by your doctor. Safety measures are also important to protect the joint from further injury. Any time any of these exercises cause you to have increased pain or swelling, decrease what you are doing until you are comfortable again and then slowly increase them. If you have problems or questions, call your caregiver or physical therapist for advice.   Rehabilitation is important following a joint replacement. After just a few days of immobilization, the muscles of the leg can become weakened and shrink (atrophy).  These exercises are designed to build up the tone and strength of the thigh and leg muscles and to improve motion. Often times heat used for twenty to thirty minutes before working out will loosen up your tissues and help with improving the range of motion but do not use heat for the first two weeks following surgery (sometimes heat can increase post-operative swelling).   These exercises can be done on a training (exercise) mat, on the floor, on a table or on a bed. Use whatever works the best and is most comfortable for you.    Use music or television while you are exercising so that the exercises are a pleasant break in your day. This will make your life better with the exercises acting as a break in your routine that you can look forward to.   Perform all exercises about fifteen times, three times per day or as directed.  You should exercise both the operative leg and the other leg as well.   Exercises include:  Quad Sets - Tighten up the muscle on the front of the thigh (Quad) and hold for 5-10 seconds.   Straight Leg Raises - With your knee straight (if you were given a brace, keep it on), lift the leg to 60 degrees, hold  for 3 seconds, and slowly lower the leg.  Perform this exercise against resistance later as your leg gets stronger.  Leg Slides: Lying on your back, slowly slide your foot toward your buttocks, bending your knee up off the floor (only go as far as is comfortable). Then slowly slide your foot back down until your leg is flat on the floor again.  Angel Wings: Lying on your back spread your legs to the side as far apart as you can without causing discomfort.  Hamstring Strength:  Lying on your back, push your heel against the floor with your leg straight by tightening up the muscles of your buttocks.  Repeat, but this time bend your knee to a comfortable angle, and push your heel against the floor.  You may put a pillow under the heel to make it more comfortable if necessary.   A rehabilitation program following joint replacement surgery can speed recovery and prevent re-injury in the future due to weakened muscles. Contact your doctor or a physical therapist for more information on knee rehabilitation.    CONSTIPATION  Constipation is defined medically as fewer than three stools per week and severe constipation as less than one stool per week.  Even if you have a regular bowel pattern at home, your normal regimen is likely to be disrupted due to multiple reasons following surgery.  Combination of anesthesia, postoperative narcotics, change in appetite and fluid intake all can affect your bowels.   YOU MUST use at least one of the following options; they are listed in order of increasing strength to get the job done.  They are all available over the counter, and you may need to use some, POSSIBLY even all of these options:    Drink plenty of fluids (prune juice may be helpful) and high fiber foods Colace 100 mg by mouth twice a day  Senokot for constipation as directed and as needed Dulcolax (bisacodyl), take with full glass of water  Miralax (polyethylene glycol) once or twice a day as needed.  If  you have tried all these things and are unable to have a bowel movement in the first 3-4 days after surgery call either your surgeon or your primary doctor.    If you experience loose stools or diarrhea, hold the medications until you stool forms back up.  If your symptoms do not get better within 1 week or if they get worse, check with your doctor.  If you experience "the worst abdominal pain ever" or develop nausea or vomiting, please contact the office immediately for further recommendations for treatment.   ITCHING:  If you experience itching with your medications, try taking only a single pain pill, or even half a pain pill at a time.  You can also use Benadryl over the counter for itching or also to help with sleep.   TED HOSE STOCKINGS:  Use stockings on both legs until for at least 2 weeks or as directed by physician office. They may be removed at night for sleeping.  MEDICATIONS:  See your medication summary on the "After Visit Summary" that nursing will review with you.  You may have some home medications which will be placed on hold until you complete the course of blood thinner medication.  It is important for you to complete the blood thinner medication as prescribed.  PRECAUTIONS:  If you experience chest pain or shortness of breath - call 911 immediately for transfer to the hospital emergency department.   If you develop a fever greater that 101 F, purulent drainage from wound, increased redness or drainage from wound, foul odor from the wound/dressing, or calf pain - CONTACT YOUR SURGEON.                                                   FOLLOW-UP APPOINTMENTS:  If you do not already have a post-op appointment, please call the office for an appointment to be seen by your surgeon.  Guidelines for how soon to be seen are listed in your "After Visit Summary", but are typically between 1-4 weeks after surgery.  OTHER INSTRUCTIONS:   Knee Replacement:  Do not place pillow under knee,  focus on keeping the knee straight while resting. CPM instructions: 0-90 degrees, 2 hours in the morning, 2 hours in the afternoon, and 2 hours in the evening. Place foam block, curve side up under heel at all times except when in CPM or when walking.  DO NOT modify, tear, cut, or change the foam block in any way.  MAKE SURE YOU:  Understand these instructions.  Get help right away if you are not doing well or get worse.    Thank you for letting us be a part of your medical care team.  It is a privilege we respect greatly.  We hope these instructions will help you stay on track for a fast and full recovery!   Do not put a pillow under the knee. Place it under the heel.    Complete by:  As directed    Place gray foam block, curve side up under heel at all times except when in CPM or when walking.  DO NOT modify, tear, cut, or change in any way the gray foam block.   Increase activity slowly as tolerated    Complete by:  As directed    Patient may shower    Complete by:  As directed    Aquacel dressing is water proof    Wash over it and the whole leg with soap and water at the end of your shower   TED hose    Complete by:  As directed    Use stockings (TED hose) for 2 weeks on both leg(s).  You may remove them at night for sleeping.      Follow-up Information    Lorn Junes, MD .   Specialty:  Orthopedic Surgery Contact information: [address] [place] [address] [phone]            Signed: Linda Hedges 05/31/2016, 10:59 AM

## 2016-08-21 ENCOUNTER — Encounter: Payer: Medicare Other | Admitting: Thoracic Surgery (Cardiothoracic Vascular Surgery)

## 2016-09-25 ENCOUNTER — Ambulatory Visit (INDEPENDENT_AMBULATORY_CARE_PROVIDER_SITE_OTHER): Payer: Medicare Other | Admitting: Thoracic Surgery (Cardiothoracic Vascular Surgery)

## 2016-09-25 ENCOUNTER — Encounter: Payer: Self-pay | Admitting: Thoracic Surgery (Cardiothoracic Vascular Surgery)

## 2016-09-25 VITALS — BP 145/69 | HR 96 | Resp 20 | Ht 73.0 in | Wt 271.0 lb

## 2016-09-25 DIAGNOSIS — Z953 Presence of xenogenic heart valve: Secondary | ICD-10-CM

## 2016-09-25 NOTE — Patient Instructions (Signed)
Continue all previous medications without any changes at this time  Make every effort to keep your diabetes under very tight control.  Follow up closely with your primary care physician or endocrinologist and strive to keep their hemoglobin A1c levels as low as possible, preferably near or below 6.0.  The long term benefits of strict control of diabetes are far reaching and critically important for your overall health and survival.  Make every effort to stay physically active, get some type of exercise on a regular basis, and stick to a "heart healthy diet".  The long term benefits for regular exercise and a healthy diet are critically important to your overall health and wellbeing.  Endocarditis is a potentially serious infection of heart valves or inside lining of the heart.  It occurs more commonly in patients with diseased heart valves (such as patient's with aortic or mitral valve disease) and in patients who have undergone heart valve repair or replacement.  Certain surgical and dental procedures may put you at risk, such as dental cleaning, other dental procedures, or any surgery involving the respiratory, urinary, gastrointestinal tract, gallbladder or prostate gland.   To minimize your chances for develooping endocarditis, maintain good oral health and seek prompt medical attention for any infections involving the mouth, teeth, gums, skin or urinary tract.    Always notify your doctor or dentist about your underlying heart valve condition before having any invasive procedures. You will need to take antibiotics before certain procedures, including all routine dental cleanings or other dental procedures.  Your cardiologist or dentist should prescribe these antibiotics for you to be taken ahead of time.

## 2016-09-25 NOTE — Progress Notes (Signed)
[address]       [place] [address]             [phone]     CARDIOTHORACIC SURGERY OFFICE NOTE  Referring Provider is Branch, Alphonse Guild, MD PCP is [facility], DO   HPI:  Patient is a 71 year old obese white male with history of aortic stenosis, obstructive sleep apnea, type 2 diabetes mellitus, and severe degenerative arthritis who returns to the office today for routine follow-up more than one year status post aortic valve replacement using a rapid deployment bioprosthetic tissue valve on 08/18/2015 for stage D severe symptomatically aortic stenosis. The patient's postoperative recovery was uneventful and he was last seen here in our office on 11/22/2015 at which time he was doing well. Since then he has continued to do very well from a cardiac standpoint.  He has been followed by Dr. Harl Bowie saw him most recently on 04/24/2016. He ultimately underwent uncomplicated left total knee replacement on 05/22/2016.  He returns to our office today and reports that he is doing very well from a cardiac standpoint. He remains disappointed in that he still has soreness in his left knee which limits his mobility. He admits that he remains very sedentary. He has problems with chronic swelling in both lower legs. He has not had significant problems with shortness of breath. He states that he does get short of breath with more strenuous exertion, but he is limited primarily by his knee. He notes that his breathing is considerably improved in comparison with how he felt prior to his heart surgery. He has not had any chest pain or chest tightness.   Current Outpatient Prescriptions  Medication Sig Dispense Refill  . acetaminophen (TYLENOL) 650 MG CR tablet Take 2 tablets (1,300 mg total) by mouth every 6 (six) hours.    Marland Kitchen aspirin 325 MG tablet Take 1 tablet (325 mg total) by mouth daily with breakfast.    . cholecalciferol (VITAMIN D) 1000 units tablet Take 2 tablets (2,000 Units  total) by mouth daily. 60 tablet 3  . dexamethasone (DECADRON) 2 MG tablet 1 tablet twice a day for pain and inflammation 60 tablet 0  . docusate sodium (COLACE) 100 MG capsule 1 tab 2 times a day while on narcotics.  STOOL SOFTENER 60 capsule 0  . insulin NPH Human (HUMULIN N,NOVOLIN N) 100 UNIT/ML injection Inject 0.35 mLs (35 Units total) into the skin 3 (three) times daily with meals. 10 mL 11  . Liraglutide (VICTOZA) 18 MG/3ML SOPN Inject 1.2 mg into the skin daily.     . metoprolol tartrate (LOPRESSOR) 25 MG tablet Take 1 tablet (25 mg total) by mouth 2 (two) times daily.    . polyethylene glycol (MIRALAX / GLYCOLAX) packet 17grams in 6 oz of water twice a day until bowel movement.  LAXITIVE.  Restart if two days since last bowel movement 14 each 0  . potassium chloride SA (K-DUR,KLOR-CON) 20 MEQ tablet Take 1 tablet (20 mEq total) by mouth daily. 90 tablet 3  . sitaGLIPtin-metformin (JANUMET) 50-1000 MG tablet Take 1 tablet by mouth 2 (two) times daily with a meal.    . tamsulosin (FLOMAX) 0.4 MG CAPS capsule Take 0.4 mg by mouth daily.     . vitamin E 400 UNIT capsule Take 400 Units by mouth daily.    Marland Kitchen oxyCODONE (OXY IR/ROXICODONE) 5 MG immediate release tablet 1 tablet every 4-6 hrs as needed for pain (Patient not taking: Reported on 09/25/2016)  42 tablet 0   No current facility-administered medications for this visit.       Physical Exam:   BP (!) 145/69   Pulse 96   Resp 20   Ht 6\' 1"  (1.854 m)   Wt 271 lb (122.9 kg)   SpO2 94% Comment: RA  BMI 35.75 kg/m   General:  Obese but well appearing  Chest:   Clear to auscultation  CV:   Regular rate and rhythm without murmur  Incisions:  Completely healed, sternum is stable  Abdomen:  Soft nontender  Extremities:  Warm and well-perfused with severe bilateral lower extremity edema  Diagnostic Tests:  Transthoracic Echocardiography  Patient:    Brent Cobb, Brent Cobb MR #:       ID:[id number] Study Date: 11/24/2015 Gender:     M Age:         57 Height:     185.4 cm Weight:     128.4 kg BSA:        2.62 m^2 Pt. Status: Room:   ATTENDING    Darylene Price, M.D.  ORDERING     Darylene Price, M.D.  PERFORMING   [facility], Forestine Na  SONOGRAPHER  Alvino Chapel, RCS  cc:  ------------------------------------------------------------------- LV EF: 60% -   65%  ------------------------------------------------------------------- Indications:      Aortic Valve Disorder 424.1.  ------------------------------------------------------------------- History:   PMH:   Congestive heart failure.  Risk factors: Diabetes mellitus.  ------------------------------------------------------------------- Study Conclusions  - Left ventricle: The cavity size was normal. Wall thickness was   increased in a pattern of moderate LVH. Systolic function was   normal. The estimated ejection fraction was in the range of 60%   to 65%. Wall motion was normal; there were no regional wall   motion abnormalities. Doppler parameters are consistent with   abnormal left ventricular relaxation (grade 1 diastolic   dysfunction). - Aortic valve: Valve area (VTI): 3.49 cm^2. Valve area (Vmax):   3.29 cm^2. Valve area (Vmean): 3.15 cm^2. - Technically adequate study.  ------------------------------------------------------------------- Labs, prior tests, procedures, and surgery: S/P aortic valve replacement: bovine bioprosthetic per patient     Transthoracic echocardiography.  M-mode, complete 2D, spectral Doppler, and color Doppler.  Birthdate:  Patient birthdate: 1946-02-26.  Age:  Patient is 72 yr old.  Sex:  Gender: male. BMI: 37.3 kg/m^2.  Blood pressure:     146/84  Patient status: Inpatient.  Study date:  Study date: 11/24/2015. Study time: 11:30 AM.  Location:  Echo laboratory.  -------------------------------------------------------------------  ------------------------------------------------------------------- Left ventricle:  The  cavity size was normal. Wall thickness was increased in a pattern of moderate LVH. Systolic function was normal. The estimated ejection fraction was in the range of 60% to 65%. Wall motion was normal; there were no regional wall motion abnormalities. Doppler parameters are consistent with abnormal left ventricular relaxation (grade 1 diastolic dysfunction). There was no evidence of elevated ventricular filling pressure by Doppler parameters.  ------------------------------------------------------------------- Aortic valve:  Edwards Intuity Elite Rapid-deployment Pericardial Tissue Valve (size 25 mm, model # [id number], serial # [id number]) is in the AV position.  Doppler:   There was no stenosis.   There was no significant regurgitation.    VTI ratio of LVOT to aortic valve: 0.92. Valve area (VTI): 3.49 cm^2. Indexed valve area (VTI): 1.33 cm^2/m^2. Peak velocity ratio of LVOT to aortic valve: 0.87. Valve area (Vmax): 3.29 cm^2. Indexed valve area (Vmax): 1.26 cm^2/m^2. Mean velocity ratio of LVOT to aortic valve: 0.83. Valve area (Vmean): 3.15 cm^2. Indexed valve area (  Vmean): 1.2 cm^2/m^2. Mean gradient (S): 3 mm Hg. Peak gradient (S): 6 mm Hg.  ------------------------------------------------------------------- Aorta:  Aortic root: The aortic root was normal in size.  ------------------------------------------------------------------- Mitral valve:   Normal thickness leaflets .  Doppler:   There was no evidence for stenosis.   There was no significant regurgitation.    Peak gradient (D): 2 mm Hg.  ------------------------------------------------------------------- Left atrium:  The atrium was normal in size.  ------------------------------------------------------------------- Atrial septum:  No defect or patent foramen ovale was identified.   ------------------------------------------------------------------- Right ventricle:  The cavity size was normal. Systolic function  was low normal.  ------------------------------------------------------------------- Pulmonic valve:   Not well visualized.  Doppler:   There was no evidence for stenosis.   There was mild regurgitation.  ------------------------------------------------------------------- Tricuspid valve:   Normal thickness leaflets.  Doppler:   There was no evidence for stenosis.   There was no significant regurgitation.   ------------------------------------------------------------------- Pulmonary artery:    Systolic pressure could not be accurately estimated.   Inadequate TR jet.  ------------------------------------------------------------------- Right atrium:  The atrium was normal in size.  ------------------------------------------------------------------- Pericardium:  There was no pericardial effusion.  ------------------------------------------------------------------- Systemic veins: Inferior vena cava: The vessel was normal in size. The respirophasic diameter changes were in the normal range (>= 50%), consistent with normal central venous pressure.  ------------------------------------------------------------------- Measurements   Left ventricle                            Value          Reference  LV ID, ED, PLAX chordal                   44.2  mm       43 - 52  LV ID, ES, PLAX chordal                   29.8  mm       23 - 38  LV fx shortening, PLAX chordal            33    %        >=29  LV PW thickness, ED                       14    mm       ---------  IVS/LV PW ratio, ED                       1              <=1.3    Ventricular septum                        Value          Reference  IVS thickness, ED                         14    mm       ---------    LVOT                                      Value          Reference  LVOT ID, S  22    mm       ---------  LVOT area                                 3.8   cm^2     ---------  LVOT peak  velocity, S                     109   cm/s     ---------  LVOT mean velocity, S                     71.7  cm/s     ---------  LVOT VTI, S                               23.6  cm       ---------  LVOT peak gradient, S                     5     mm Hg    ---------    Aortic valve                              Value          Reference  Aortic valve peak velocity, S             126   cm/s     ---------  Aortic valve mean velocity, S             86.4  cm/s     ---------  Aortic valve VTI, S                       25.7  cm       ---------  Aortic mean gradient, S                   3     mm Hg    ---------  Aortic peak gradient, S                   6     mm Hg    ---------  VTI ratio, LVOT/AV                        0.92           ---------  Aortic valve area, VTI                    3.49  cm^2     ---------  Aortic valve area/bsa, VTI                1.33  cm^2/m^2 ---------  Velocity ratio, peak, LVOT/AV             0.87           ---------  Aortic valve area, peak velocity          3.29  cm^2     ---------  Aortic valve area/bsa, peak               1.26  cm^2/m^2 ---------  velocity  Velocity ratio, mean, LVOT/AV             0.83           ---------  Aortic valve area, mean velocity          3.15  cm^2     ---------  Aortic valve area/bsa, mean               1.2   cm^2/m^2 ---------  velocity    Aorta                                     Value          Reference  Aortic root ID, ED                        32    mm       ---------    Left atrium                               Value          Reference  LA ID, A-P, ES                            46    mm       ---------  LA volume/bsa, S                          25.3  ml/m^2   ---------    Mitral valve                              Value          Reference  Mitral E-wave peak velocity               71.3  cm/s     ---------  Mitral A-wave peak velocity               110   cm/s     ---------  Mitral deceleration time                  164   ms       150 -  230  Mitral peak gradient, D                   2     mm Hg    ---------  Mitral E/A ratio, peak                    0.6            ---------    Right ventricle                           Value          Reference  TAPSE                                     16    mm       ---------  Legend: (L)  and  (H)  mark values outside specified reference range.  ------------------------------------------------------------------- Prepared and Electronically Authenticated by  Kerry Hough, M.D. 2017-03-22T12:59:53   Impression:  Patient is doing well from a cardiac standpoint  more than one year status post aortic valve replacement using a rapid deployment bioprosthetic tissue valve. The patient's functional status appears to be limited primarily by his severe degenerative arthritis. He describes stable symptoms of exertional shortness of breath and fatigue consistent with chronic diastolic congestive heart failure, New York Heart Association functional class II. He does have significant chronic lower extremity edema.  Plan:  We have not recommended any changes to the patient's current medications. The patient ultimately might benefit from increased dose of diuretics. I have encouraged the patient to make an effort to increase his physical mobility and get some type of exercise on a regular basis. Weight loss would certainly be helpful. All of his questions been addressed. In the future the patient will call and return to see [place] only should specific problems or questions arise. He will continue to follow-up intermittently with Dr. Harl Bowie.  I spent in excess of 15 minutes during the conduct of this office consultation and >50% of this time involved direct face-to-face encounter with the patient for counseling and/or coordination of their care.   Valentina Gu. Roxy Manns, MD 09/25/2016 11:22 AM

## 2017-04-26 ENCOUNTER — Encounter: Payer: Self-pay | Admitting: Cardiology

## 2017-04-26 ENCOUNTER — Ambulatory Visit (INDEPENDENT_AMBULATORY_CARE_PROVIDER_SITE_OTHER): Payer: Medicare Other | Admitting: Cardiology

## 2017-04-26 VITALS — BP 100/60 | HR 73 | Ht 73.0 in | Wt 268.0 lb

## 2017-04-26 DIAGNOSIS — Z953 Presence of xenogenic heart valve: Secondary | ICD-10-CM | POA: Diagnosis not present

## 2017-04-26 DIAGNOSIS — I350 Nonrheumatic aortic (valve) stenosis: Secondary | ICD-10-CM

## 2017-04-26 DIAGNOSIS — G4730 Sleep apnea, unspecified: Secondary | ICD-10-CM

## 2017-04-26 DIAGNOSIS — I2510 Atherosclerotic heart disease of native coronary artery without angina pectoris: Secondary | ICD-10-CM | POA: Diagnosis not present

## 2017-04-26 DIAGNOSIS — I5032 Chronic diastolic (congestive) heart failure: Secondary | ICD-10-CM

## 2017-04-26 MED ORDER — ASPIRIN EC 81 MG PO TBEC: 81.0000 mg | DELAYED_RELEASE_TABLET | Freq: Every day | ORAL | 3 refills | Status: DC

## 2017-04-26 NOTE — Patient Instructions (Signed)
Medication Instructions:  Your physician has recommended you make the following change in your medication:   Stop Aspirin 325 mg  Begin Aspirin 81 mg  Please continue all other medications as prescribed  Labwork: none  Testing/Procedures: none  Follow-Up: Your physician wants you to follow-up in: 1 year with Dr. Bryna Colander will receive a reminder letter in the mail two months in advance. If you don't receive a letter, please call our office to schedule the follow-up appointment.  Any Other Special Instructions Will Be Listed Below (If Applicable). You have been referred to Pulmonology with Dr. Luan Pulling  If you need a refill on your cardiac medications before your next appointment, please call your pharmacy.

## 2017-04-26 NOTE — Progress Notes (Signed)
Clinical Summary [name] is a 71 y.o.male seen today for follow up of the following medical problems.   1. Aortic stenosis - s/p AVR 08/2015 with a 25 mm Edwards peridcardial tissue valve.  - no recent SOB/DOE, no chest pain. Completed rehab at [facility]  - no recent SOB/DOE. No recent chest pain  2. CAD - cath 07/2015 showed mild disease, he did have some 70-80% disease in small branches of the LCX that was managed medically.  - Cobb leg pains on statins, has not been on  - losartan stopped due to low bp's during rehab.    - compliant with meds  3. Hyperlipidemia - did not tolerate statins, tried on zetia but his insurance would not cover - Jan 2017 TC 118 TG 125 HDL 35 LDL 121   4. Chronic diastolic HF - has had some recent LE edema - Cobb high salt intake  5. Fatigue - feels sleepy drowsy - to bed at 1230AM, wakes up 7 to 8 AM.  - poor cpap compliance  6. OSA - not compliant with cpap machine.  Past Medical History:  Diagnosis Date  . Arthritis   . Asthma    as a child  . Cancer ([place])    skin cancer on face  . Chronic diastolic congestive heart failure (HCC)    NYHA functional class II, pt unsure of whether he has this  . Diabetes mellitus ([place])   . Frequency of urination   . Heart murmur   . Obesity   . Obstructive sleep apnea    Intolerant of CPAP  . Primary localized osteoarthritis of left knee   . Radial nerve injury    right  . 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 Severe aortic stenosis 07/21/2015  . Type II diabetes mellitus (HCC)    Insulin-dependent  . Varicose veins      Allergies  Allergen Reactions  . No Known Allergies      Current Outpatient Prescriptions  Medication Sig Dispense Refill  . acetaminophen (TYLENOL) 650 MG CR tablet Take 2 tablets (1,300 mg total) by mouth every 6 (six) hours.    Marland Kitchen  aspirin 325 MG tablet Take 1 tablet (325 mg total) by mouth daily with breakfast.    . cholecalciferol (VITAMIN D) 1000 units tablet Take 2 tablets (2,000 Units total) by mouth daily. 60 tablet 3  . ezetimibe (ZETIA) 10 MG tablet Take 10 mg by mouth daily.    . furosemide (LASIX) 40 MG tablet Take 40 mg by mouth daily.    . hydrOXYzine (ATARAX/VISTARIL) 25 MG tablet Take 25 mg by mouth 2 (two) times daily.    . insulin NPH Human (HUMULIN N,NOVOLIN N) 100 UNIT/ML injection Inject 30 Units into the skin 3 (three) times daily.    . Liraglutide (VICTOZA) 18 MG/3ML SOPN Inject 1.2 mg into the skin daily.     . meloxicam (MOBIC) 7.5 MG tablet Take 7.5 mg by mouth daily.    . metoprolol tartrate (LOPRESSOR) 25 MG tablet Take 1 tablet (25 mg total) by mouth 2 (two) times daily.    . potassium chloride SA (K-DUR,KLOR-CON) 20 MEQ tablet Take 1 tablet (20 mEq total) by mouth daily. 90 tablet 3  . sitaGLIPtin-metformin (JANUMET) 50-1000 MG tablet Take 1 tablet by mouth 2 (two) times daily with a meal.    . tamsulosin (FLOMAX) 0.4 MG CAPS capsule Take  0.4 mg by mouth daily.     . vitamin E 400 UNIT capsule Take 400 Units by mouth daily.     No current facility-administered medications for this visit.      Past Surgical History:  Procedure Laterality Date  . AORTIC VALVE REPLACEMENT N/A 08/18/2015   Procedure: AORTIC VALVE REPLACEMENT (AVR);  Surgeon: [name], MD;  Location: [facility];  Service: Open Heart Surgery;  Laterality: N/A;  . CARDIAC CATHETERIZATION N/A 07/21/2015   Procedure: Right/Left Heart Cath and Coronary Angiography;  Surgeon: Sherren Mocha, MD;  Location: [facility];  Service: Cardiovascular;  Laterality: N/A;  . CARPAL TUNNEL RELEASE Right   . CERVICAL SPINE SURGERY     30 years ago and last time 10 years ago  . COLONOSCOPY    . KNEE SURGERY Left 2013  . PENILE PROSTHESIS IMPLANT     revealed DOS to circulating nurse  . POLYPECTOMY  03/21/2010  . SHOULDER SURGERY  Left 2013  . SKIN CANCER EXCISION  1991   removal of skin cancer to right cheek area  . TEE WITHOUT CARDIOVERSION N/A 08/18/2015   Procedure: TRANSESOPHAGEAL ECHOCARDIOGRAM (TEE);  Surgeon: [name], MD;  Location: [facility];  Service: Open Heart Surgery;  Laterality: N/A;  . TOTAL KNEE ARTHROPLASTY Left 05/22/2016   Procedure: TOTAL KNEE ARTHROPLASTY;  Surgeon: Elsie Saas, MD;  Location: [place];  Service: Orthopedics;  Laterality: Left;     Allergies  Allergen Reactions  . No Known Allergies       Family History  Problem Relation Age of Onset  . Heart attack Mother   . Alcoholism Father   . Valvular heart disease Sister   . Diabetes Brother      Social History Brent Cobb that he quit smoking about 27 years ago. His smoking use included Cigarettes. He started smoking about 59 years ago. He has a 49.50 pack-year smoking history. He quit smokeless tobacco use about 47 years ago. Brent Cobb that he does not drink alcohol.   Review of Systems CONSTITUTIONAL: +fatigue HEENT: Eyes: No visual loss, blurred vision, double vision or yellow sclerae.No hearing loss, sneezing, congestion, runny nose or sore throat.  SKIN: No rash or itching.  CARDIOVASCULAR: per hpi RESPIRATORY: per hpi GASTROINTESTINAL: No anorexia, nausea, vomiting or diarrhea. No abdominal pain or blood.  GENITOURINARY: No burning on urination, no polyuria NEUROLOGICAL: No headache, dizziness, syncope, paralysis, ataxia, numbness or tingling in the extremities. No change in bowel or bladder control.  MUSCULOSKELETAL: No muscle, back pain, joint pain or stiffness.  LYMPHATICS: No enlarged nodes. No history of splenectomy.  PSYCHIATRIC: No history of depression or anxiety.  ENDOCRINOLOGIC: No Cobb of sweating, cold or heat intolerance. No polyuria or polydipsia.  Marland Kitchen   Physical Examination Vitals:   04/26/17 1300  BP: 100/60  Pulse: 73  SpO2: 95%   Filed Weights   04/26/17 1300  Weight:  268 lb (121.6 kg)    Gen: resting comfortably, no acute distress HEENT: no scleral icterus, pupils equal round and reactive, no palptable cervical adenopathy,  CV: RRR, no m/r/g, no jvd Resp: Clear to auscultation bilaterally GI: abdomen is soft, non-tender, non-distended, normal bowel sounds, no hepatosplenomegaly MSK: extremities are warm, no edema.  Skin: warm, no rash Neuro:  no focal deficits Psych: appropriate affect   Diagnostic Studies  07/2015 echo Study Conclusions  - Left ventricle: The cavity size was normal. Wall thickness was increased in a pattern of severe LVH. Systolic  function was normal. The estimated ejection fraction was in the range of 60% to 65%. Wall motion was normal; there were no regional wall motion abnormalities. Doppler parameters are consistent with abnormal left ventricular relaxation (grade 1 diastolic dysfunction). - Aortic valve: Severely calcified annulus. Trileaflet; severely thickened leaflets. There was severe stenosis. The contour of the spectral Doppler waveform may lead to underestimation of severity. Valve area (VTI): 1.01 cm^2. Valve area (Vmax): 0.96 cm^2. Valve area (Vmean): 0.79 cm^2. - Mitral valve: Mildly calcified annulus. Mildly thickened leaflets . - Left atrium: The atrium was mildly dilated. - Technically adequate study.   07/2015 Cath    Dominance: Left       Left Main   . LM lesion, 30% stenosed.          Left Circumflex   . Mid Cx lesion, 25% stenosed.   . Second Obtuse Marginal Teresha Hanks   The vessel is small in size. There is moderate disease in the vessel.   . Third Obtuse Marginal Wanna Gully   The vessel is small in size. There is moderate disease in the vessel.   . First Left Posterolateral Sahan Pen   . 1st LPL lesion, 75% stenosed.   . Third Left Posterolateral Alithia Zavaleta   . 3rd LPL lesion, 80% stenosed. Small vessel     Right Coronary  Artery  Patent, nondominant RCA     Fick Cardiac Output  5.51 L/min    Fick Cardiac Output Index  2.29 (L/min)/BSA   RA A Wave  6 mmHg   RA V Wave  6 mmHg   RA Mean  4 mmHg   RV Systolic Pressure  34 mmHg   RV Diastolic Pressure  1 mmHg   RV EDP  8 mmHg   PA Systolic Pressure  32 mmHg   PA Diastolic Pressure  10 mmHg   PA Mean  21 mmHg   PW A Wave  11 mmHg   PW V Wave  9 mmHg   PW Mean  8 mmHg   AO Systolic Pressure  818 mmHg   AO Diastolic Pressure  68 mmHg   AO Mean  97 mmHg   QP/QS  1   TPVR Index  9.19 HRUI   TSVR Index  42.44 HRUI   PVR SVR Ratio  0.14   TPVR/TSVR Ratio  0.22         Assessment and Plan  1. Severe aortic stenosis - s/p AVR, continues to do well without symptoms - continue to monitor - change ASA to 81mg  daily  2. CAD - no current symptoms. EKG in clinic shows SR, no ischemic changes - he will continue current meds  3. Hyperlipidemia - intolerant to statins. Insurance did not cover zetia - continue dietary modification, may consider pcsk9 inhibitor but likely to be a cost issue as well  4. OSA - poor cpap compliance, recent somnolence and fatigue - refer to Dr Luan Pulling pulmonary  5. Chronic diastolic HF - continue lasix, counseled ok to take additional as needed for swelling or weight gain    F/u 1 year      Arnoldo Lenis, M.D.

## 2018-05-07 ENCOUNTER — Encounter: Payer: Self-pay | Admitting: *Deleted

## 2018-05-07 ENCOUNTER — Ambulatory Visit (INDEPENDENT_AMBULATORY_CARE_PROVIDER_SITE_OTHER): Payer: Medicare Other | Admitting: Cardiology

## 2018-05-07 ENCOUNTER — Encounter: Payer: Self-pay | Admitting: Cardiology

## 2018-05-07 VITALS — BP 109/76 | HR 89 | Ht 73.0 in | Wt 261.8 lb

## 2018-05-07 DIAGNOSIS — R42 Dizziness and giddiness: Secondary | ICD-10-CM | POA: Diagnosis not present

## 2018-05-07 DIAGNOSIS — Z953 Presence of xenogenic heart valve: Secondary | ICD-10-CM

## 2018-05-07 DIAGNOSIS — E782 Mixed hyperlipidemia: Secondary | ICD-10-CM | POA: Diagnosis not present

## 2018-05-07 DIAGNOSIS — I5032 Chronic diastolic (congestive) heart failure: Secondary | ICD-10-CM | POA: Diagnosis not present

## 2018-05-07 NOTE — Patient Instructions (Signed)
Your physician wants you to follow-up in: [place] will receive a reminder letter in the mail two months in advance. If you don't receive a letter, please call our office to schedule the follow-up appointment.  Your physician has recommended you make the following change in your medication:   STOP LOPRESSOR FOR 2 WEEKS AND CALL us WITH AN UPDATE ON YOUR SYMPTOMS  Thank you for choosing [facility]!!

## 2018-05-07 NOTE — Progress Notes (Signed)
Clinical Summary Brent Cobb is a 72 y.o.male seen today for follow up of the following medical problems.   1. Aortic stenosis - s/p AVR 08/2015 with a 25 mm Edwards peridcardial tissue valve.  - no recent SOB/DOE, no chest pain. Completed rehab at [facility]   - no SOE or DOE. No chest pain.  - not on ASA due to bleeding behind his eye he reports, his eye doctor stopped.    2. Leg pains/Claudication - reports abnormal ABIs - upcoming tests with Dr Luiz Ochoa, he thinks an angiogram   3.Dizziness - mainly with first standing. He reports at a prior doctors visit his orthostatic were abnormal - mainly drinks caffeinated drinks.    4. CAD - cath 07/2015 showed mild disease, he did have some 70-80% disease in small branches of the LCX that was managed medically.  - reports leg pains on statins, has not been on  - losartan stopped due to low bp's during rehab.   - no recent chest pain.   3. Hyperlipidemia - did not tolerate statins. Currentlty on zetia.    4. Chronic diastolic HF - no recent symptoms, has not had recent issues with fluid overload. Not requiring diuretics.   5. Fatigue - primary lowered metoprolol dose recently, no change in symptms - poor complaince with cpap  6. OSA - not compliant with cpap machine.     Past Medical History:  Diagnosis Date  . Arthritis   . Asthma    as a child  . Cancer ([place])    skin cancer on face  . Chronic diastolic congestive heart failure (HCC)    NYHA functional class II, pt unsure of whether he has this  . Diabetes mellitus ([place])   . Frequency of urination   . Heart murmur   . Obesity   . Obstructive sleep apnea    Intolerant of CPAP  . Primary localized osteoarthritis of left knee   . Radial nerve injury    right  . 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 Severe aortic stenosis 07/21/2015  .  Type II diabetes mellitus (HCC)    Insulin-dependent  . Varicose veins      Allergies  Allergen Reactions  . No Known Allergies      Current Outpatient Medications  Medication Sig Dispense Refill  . acetaminophen (TYLENOL) 650 MG CR tablet Take 2 tablets (1,300 mg total) by mouth every 6 (six) hours.    Marland Kitchen aspirin EC 81 MG tablet Take 1 tablet (81 mg total) by mouth daily. 90 tablet 3  . cholecalciferol (VITAMIN D) 1000 units tablet Take 2 tablets (2,000 Units total) by mouth daily. 60 tablet 3  . ezetimibe (ZETIA) 10 MG tablet Take 10 mg by mouth daily.    . furosemide (LASIX) 40 MG tablet Take 40 mg by mouth daily.    . hydrOXYzine (ATARAX/VISTARIL) 25 MG tablet Take 25 mg by mouth 2 (two) times daily.    . insulin NPH Human (HUMULIN N,NOVOLIN N) 100 UNIT/ML injection Inject 30 Units into the skin 3 (three) times daily.    . Liraglutide (VICTOZA) 18 MG/3ML SOPN Inject 1.2 mg into the skin daily.     . meloxicam (MOBIC) 7.5 MG tablet Take 7.5 mg by mouth daily.    . metoprolol tartrate (LOPRESSOR) 25 MG tablet Take 1 tablet (25 mg total) by mouth 2 (two) times daily.    Marland Kitchen  potassium chloride SA (K-DUR,KLOR-CON) 20 MEQ tablet Take 1 tablet (20 mEq total) by mouth daily. 90 tablet 3  . sitaGLIPtin-metformin (JANUMET) 50-1000 MG tablet Take 1 tablet by mouth 2 (two) times daily with a meal.    . tamsulosin (FLOMAX) 0.4 MG CAPS capsule Take 0.4 mg by mouth daily.     . vitamin E 400 UNIT capsule Take 400 Units by mouth daily.     No current facility-administered medications for this visit.      Past Surgical History:  Procedure Laterality Date  . AORTIC VALVE REPLACEMENT N/A 08/18/2015   Procedure: AORTIC VALVE REPLACEMENT (AVR);  Surgeon: [name], MD;  Location: [place];  Service: Open Heart Surgery;  Laterality: N/A;  . CARDIAC CATHETERIZATION N/A 07/21/2015   Procedure: Right/Left Heart Cath and Coronary Angiography;  Surgeon: Sherren Mocha, MD;  Location: [place] [facility];  Service: Cardiovascular;  Laterality: N/A;  . CARPAL TUNNEL RELEASE Right   . CERVICAL SPINE SURGERY     30 years ago and last time 10 years ago  . COLONOSCOPY    . KNEE SURGERY Left 2013  . PENILE PROSTHESIS IMPLANT     revealed DOS to circulating nurse  . POLYPECTOMY  03/21/2010  . SHOULDER SURGERY Left 2013  . SKIN CANCER EXCISION  1991   removal of skin cancer to right cheek area  . TEE WITHOUT CARDIOVERSION N/A 08/18/2015   Procedure: TRANSESOPHAGEAL ECHOCARDIOGRAM (TEE);  Surgeon: [name], MD;  Location: [place];  Service: Open Heart Surgery;  Laterality: N/A;  . TOTAL KNEE ARTHROPLASTY Left 05/22/2016   Procedure: TOTAL KNEE ARTHROPLASTY;  Surgeon: Elsie Saas, MD;  Location: [place];  Service: Orthopedics;  Laterality: Left;     Allergies  Allergen Reactions  . No Known Allergies       Family History  Problem Relation Age of Onset  . Heart attack Mother   . Alcoholism Father   . Valvular heart disease Sister   . Diabetes Brother      Social History Brent Cobb reports that he quit smoking about 28 years ago. His smoking use included cigarettes. He started smoking about 60 years ago. He has a 49.50 pack-year smoking history. He quit smokeless tobacco use about 48 years ago. Brent Cobb reports that he does not drink alcohol.   Review of Systems CONSTITUTIONAL: No weight loss, fever, chills, weakness or fatigue.  HEENT: Eyes: No visual loss, blurred vision, double vision or yellow sclerae.No hearing loss, sneezing, congestion, runny nose or sore throat.  SKIN: No rash or itching.  CARDIOVASCULAR: per hpi RESPIRATORY: No shortness of breath, cough or sputum.  GASTROINTESTINAL: No anorexia, nausea, vomiting or diarrhea. No abdominal pain or blood.  GENITOURINARY: No burning on urination, no polyuria NEUROLOGICAL: No headache, dizziness, syncope, paralysis, ataxia, numbness or tingling in the extremities. No change in bowel or bladder control.    MUSCULOSKELETAL: No muscle, back pain, joint pain or stiffness.  LYMPHATICS: No enlarged nodes. No history of splenectomy.  PSYCHIATRIC: No history of depression or anxiety.  ENDOCRINOLOGIC: No reports of sweating, cold or heat intolerance. No polyuria or polydipsia.  Marland Kitchen   Physical Examination Vitals:   05/07/18 1032  BP: 109/76  Pulse: 89   Vitals:   05/07/18 1032  Weight: 261 lb 12.8 oz (118.8 kg)  Height: 6\' 1"  (1.854 m)    Gen: resting comfortably, no acute distress HEENT: no scleral icterus, pupils equal round and reactive, no palptable cervical adenopathy,  CV: RRR,  no m/r/g, no jvd Resp: Clear to auscultation bilaterally GI: abdomen is soft, non-tender, non-distended, normal bowel sounds, no hepatosplenomegaly MSK: extremities are warm, no edema.  Skin: warm, no rash Neuro:  no focal deficits Psych: appropriate affect   Diagnostic Studies 07/2015 echo Study Conclusions  - Left ventricle: The cavity size was normal. Wall thickness was increased in a pattern of severe LVH. Systolic function was normal. The estimated ejection fraction was in the range of 60% to 65%. Wall motion was normal; there were no regional wall motion abnormalities. Doppler parameters are consistent with abnormal left ventricular relaxation (grade 1 diastolic dysfunction). - Aortic valve: Severely calcified annulus. Trileaflet; severely thickened leaflets. There was severe stenosis. The contour of the spectral Doppler waveform may lead to underestimation of severity. Valve area (VTI): 1.01 cm^2. Valve area (Vmax): 0.96 cm^2. Valve area (Vmean): 0.79 cm^2. - Mitral valve: Mildly calcified annulus. Mildly thickened leaflets . - Left atrium: The atrium was mildly dilated. - Technically adequate study.   07/2015 Cath    Dominance: Left       Left Main   . LM lesion, 30% stenosed.          Left Circumflex   . Mid Cx lesion, 25% stenosed.    . Second Obtuse Marginal Alcide Memoli   The vessel is small in size. There is moderate disease in the vessel.   . Third Obtuse Marginal Kierre Hintz   The vessel is small in size. There is moderate disease in the vessel.   . First Left Posterolateral Shivangi Lutz   . 1st LPL lesion, 75% stenosed.   . Third Left Posterolateral Gradyn Shein   . 3rd LPL lesion, 80% stenosed. Small vessel     Right Coronary Artery  Patent, nondominant RCA     Fick Cardiac Output  5.51 L/min    Fick Cardiac Output Index  2.29 (L/min)/BSA   RA A Wave  6 mmHg   RA V Wave  6 mmHg   RA Mean  4 mmHg   RV Systolic Pressure  34 mmHg   RV Diastolic Pressure  1 mmHg   RV EDP  8 mmHg   PA Systolic Pressure  32 mmHg   PA Diastolic Pressure  10 mmHg   PA Mean  21 mmHg   PW A Wave  11 mmHg   PW V Wave  9 mmHg   PW Mean  8 mmHg   AO Systolic Pressure  867 mmHg   AO Diastolic Pressure  68 mmHg   AO Mean  97 mmHg   QP/QS  1   TPVR Index  9.19 HRUI   TSVR Index  42.44 HRUI   PVR SVR Ratio  0.14   TPVR/TSVR Ratio  0.22        Assessment and Plan  1. Severe aortic stenosis s/p AVR - s/p AVR, no symptoms - his eye doctor stopped ASA due to some bleeding, we will request recors  2. CAD - no symptoms, continue current meds  3. Hyperlipidemia - intolerant to statins. Continue zetia  4. OSA - poor cpap compliance,  I think this is the cause of his fatigue - he is not yet willing to retry cpap  5. Chronic diastolic HF - no recent symptoms, contniue current meds  6. Dizziness - consistent with orthostatic dizziness. Mainly drinking caffeinated drinks, encouraged more water intake and monitoring of symptoms  7. Fatigue - likely due to OSA and not using cpap - try holding lopressor x 2 weeks to  see if any relation to symptoms, he is to call and update [place].    F/u 1  year    Arnoldo Lenis, M.D.

## 2018-05-21 ENCOUNTER — Encounter: Payer: Self-pay | Admitting: *Deleted

## 2018-05-21 ENCOUNTER — Telehealth: Payer: Self-pay | Admitting: Cardiology

## 2018-05-21 NOTE — Telephone Encounter (Signed)
Received telephone call from patient's wife stating that patient has went back on Lorpressor .  States that BP and HR went up.

## 2018-05-21 NOTE — Telephone Encounter (Signed)
Pt wife says pt restarted Lopressor 25 mg bid last Tuesday per Dr Luiz Ochoa in [place] for HR increase (wife or pt doesn't know what it was or what it has been running since will request notes) wanted to make Dr Harl Bowie aware since pt was to hold lopressor for 2 weeks for fatigue - pt hasn't noticed a difference in fatigue symptoms

## 2018-05-21 NOTE — Telephone Encounter (Signed)
Thanks for update, if fatigue was not improved off the medicine I agree with restarting it   Zandra Abts MD

## 2019-06-20 ENCOUNTER — Ambulatory Visit: Payer: Medicare Other | Admitting: Cardiology

## 2019-06-20 ENCOUNTER — Ambulatory Visit (INDEPENDENT_AMBULATORY_CARE_PROVIDER_SITE_OTHER): Payer: Medicare Other | Admitting: Cardiology

## 2019-06-20 ENCOUNTER — Encounter: Payer: Self-pay | Admitting: Cardiology

## 2019-06-20 ENCOUNTER — Encounter: Payer: Self-pay | Admitting: *Deleted

## 2019-06-20 ENCOUNTER — Other Ambulatory Visit: Payer: Self-pay

## 2019-06-20 VITALS — BP 130/70 | HR 66 | Ht 73.0 in | Wt 276.0 lb

## 2019-06-20 DIAGNOSIS — I5032 Chronic diastolic (congestive) heart failure: Secondary | ICD-10-CM

## 2019-06-20 NOTE — Progress Notes (Signed)
Clinical Summary Ms. Fearrington is a 73 y.o.adult seen today for follow up of the following medical problems.   1. Aortic stenosis - s/p AVR 08/2015 with a 25 mm Edwards peridcardial tissue valve.  - no recent SOB/DOE, no chest pain. Completed rehab at [facility]  - no recent SOB/DOE.    2. Leg pains/Claudication -from his report recent multiple interventions on his legs, we do not have records at this time, has been followed by Dr Luiz Ochoa - appears he is on ASA and plavix for this reason.    3.Dizziness - mainly with first standing. He reports at a prior doctors visit his orthostatic were abnormal - mainly drinks caffeinated drinks.   - chronic stable symptoms.    4. CAD - cath 07/2015 showed mild disease, he did have some 70-80% disease in small branches of the LCX that was managed medically.  - reports leg pains on statins, has not been on  - losartan stopped due to low bp's during rehab.   - denies any chest pain    5. Hyperlipidemia - did not tolerate statins. At our last visit 1 year ago he was on zetia, no longer on. Unclear which of his multiple providers may have stopped it.    6. Chronic diastolic HF - Not requiring diuretics.  - no recent symptoms.     7. OSA - not compliant with cpap machine.   Past Medical History:  Diagnosis Date  . Arthritis   . Asthma    as a child  . Cancer ([place])    skin cancer on face  . Chronic diastolic congestive heart failure (HCC)    NYHA functional class II, pt unsure of whether he has this  . Diabetes mellitus ([place])   . Frequency of urination   . Heart murmur   . Obesity   . Obstructive sleep apnea    Intolerant of CPAP  . Primary localized osteoarthritis of left knee   . Radial nerve injury    right  . 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 Severe aortic stenosis 07/21/2015  . Type  II diabetes mellitus (HCC)    Insulin-dependent  . Varicose veins      Allergies  Allergen Reactions  . No Known Allergies      Current Outpatient Medications  Medication Sig Dispense Refill  . acetaminophen (TYLENOL) 650 MG CR tablet Take 2 tablets (1,300 mg total) by mouth every 6 (six) hours.    . cholecalciferol (VITAMIN D) 1000 units tablet Take 1,000 Units by mouth daily.    . cilostazol (PLETAL) 100 MG tablet Take 1 tablet by mouth 2 (two) times daily.    Marland Kitchen escitalopram (LEXAPRO) 10 MG tablet Take 1 tablet by mouth daily.    Marland Kitchen ezetimibe (ZETIA) 10 MG tablet Take 10 mg by mouth daily.    Marland Kitchen gemfibrozil (LOPID) 600 MG tablet Take 1 tablet by mouth 2 (two) times daily.    . hydrOXYzine (ATARAX/VISTARIL) 25 MG tablet Take 25 mg by mouth 3 (three) times daily.     . insulin NPH Human (HUMULIN N,NOVOLIN N) 100 UNIT/ML injection Inject 30 Units into the skin 3 (three) times daily.    . Liraglutide (VICTOZA) 18 MG/3ML SOPN Inject 1.2 mg into the skin daily.     . meloxicam (MOBIC) 7.5 MG tablet Take 7.5 mg by mouth daily.    . metoprolol tartrate (LOPRESSOR)  25 MG tablet Take 12.5 mg by mouth 2 (two) times daily.    . sertraline (ZOLOFT) 50 MG tablet Take 1 tablet by mouth daily.    . sitaGLIPtin-metformin (JANUMET) 50-1000 MG tablet Take 1 tablet by mouth 2 (two) times daily with a meal.    . tamsulosin (FLOMAX) 0.4 MG CAPS capsule Take 0.4 mg by mouth daily.     . vitamin E 400 UNIT capsule Take 400 Units by mouth daily.     No current facility-administered medications for this visit.      Past Surgical History:  Procedure Laterality Date  . AORTIC VALVE REPLACEMENT N/A 08/18/2015   Procedure: AORTIC VALVE REPLACEMENT (AVR);  Surgeon: [name], MD;  Location: [facility];  Service: Open Heart Surgery;  Laterality: N/A;  . CARDIAC CATHETERIZATION N/A 07/21/2015   Procedure: Right/Left Heart Cath and Coronary Angiography;  Surgeon: Sherren Mocha, MD;  Location: [place] [facility];  Service: Cardiovascular;  Laterality: N/A;  . CARPAL TUNNEL RELEASE Right   . CERVICAL SPINE SURGERY     30 years ago and last time 10 years ago  . COLONOSCOPY    . KNEE SURGERY Left 2013  . PENILE PROSTHESIS IMPLANT     revealed DOS to circulating nurse  . POLYPECTOMY  03/21/2010  . SHOULDER SURGERY Left 2013  . SKIN CANCER EXCISION  1991   removal of skin cancer to right cheek area  . TEE WITHOUT CARDIOVERSION N/A 08/18/2015   Procedure: TRANSESOPHAGEAL ECHOCARDIOGRAM (TEE);  Surgeon: [name], MD;  Location: [place];  Service: Open Heart Surgery;  Laterality: N/A;  . TOTAL KNEE ARTHROPLASTY Left 05/22/2016   Procedure: TOTAL KNEE ARTHROPLASTY;  Surgeon: Elsie Saas, MD;  Location: [place];  Service: Orthopedics;  Laterality: Left;     Allergies  Allergen Reactions  . No Known Allergies       Family History  Problem Relation Age of Onset  . Heart attack Mother   . Alcoholism Father   . Valvular heart disease Sister   . Diabetes Brother      Social History Ms. Goodroe reports that she quit smoking about 29 years ago. Her smoking use included cigarettes. She started smoking about 62 years ago. She has a 49.50 pack-year smoking history. She quit smokeless tobacco use about 49 years ago. Ms. Schoepp reports no history of alcohol use.   Review of Systems CONSTITUTIONAL: No weight loss, fever, chills, weakness or fatigue.  HEENT: Eyes: No visual loss, blurred vision, double vision or yellow sclerae.No hearing loss, sneezing, congestion, runny nose or sore throat.  SKIN: No rash or itching.  CARDIOVASCULAR: per hpi RESPIRATORY: No shortness of breath, cough or sputum.  GASTROINTESTINAL: No anorexia, nausea, vomiting or diarrhea. No abdominal pain or blood.  GENITOURINARY: No burning on urination, no polyuria NEUROLOGICAL: No headache, dizziness, syncope, paralysis, ataxia, numbness or tingling in the extremities. No change in bowel or bladder control.   MUSCULOSKELETAL: No muscle, back pain, joint pain or stiffness.  LYMPHATICS: No enlarged nodes. No history of splenectomy.  PSYCHIATRIC: No history of depression or anxiety.  ENDOCRINOLOGIC: No reports of sweating, cold or heat intolerance. No polyuria or polydipsia.  Marland Kitchen   Physical Examination Today's Vitals   06/20/19 1319  BP: 130/70  Pulse: 66  SpO2: 94%  Weight: 276 lb (125.2 kg)  Height: 6\' 1"  (1.854 m)   Body mass index is 36.41 kg/m.  Gen: resting comfortably, no acute distress HEENT: no scleral icterus, pupils equal round  and reactive, no palptable cervical adenopathy,  CV: RRR, no m/r/g, no jvd Resp: Clear to auscultation bilaterally GI: abdomen is soft, non-tender, non-distended, normal bowel sounds, no hepatosplenomegaly MSK: extremities are warm, no edema.  Skin: warm, no rash Neuro:  no focal deficits Psych: appropriate affect   Diagnostic Studies 07/2015 echo Study Conclusions  - Left ventricle: The cavity size was normal. Wall thickness was increased in a pattern of severe LVH. Systolic function was normal. The estimated ejection fraction was in the range of 60% to 65%. Wall motion was normal; there were no regional wall motion abnormalities. Doppler parameters are consistent with abnormal left ventricular relaxation (grade 1 diastolic dysfunction). - Aortic valve: Severely calcified annulus. Trileaflet; severely thickened leaflets. There was severe stenosis. The contour of the spectral Doppler waveform may lead to underestimation of severity. Valve area (VTI): 1.01 cm^2. Valve area (Vmax): 0.96 cm^2. Valve area (Vmean): 0.79 cm^2. - Mitral valve: Mildly calcified annulus. Mildly thickened leaflets . - Left atrium: The atrium was mildly dilated. - Technically adequate study.   07/2015 Cath         Dominance: Left        Left Main   . LM lesion, 30% stenosed.          Left Circumflex   . Mid Cx  lesion, 25% stenosed.   . Second Obtuse Marginal Iley Deignan   The vessel is small in size. There is moderate disease in the vessel.   . Third Obtuse Marginal Bobbie Valletta   The vessel is small in size. There is moderate disease in the vessel.   . First Left Posterolateral Klinton Candelas   . 1st LPL lesion, 75% stenosed.   . Third Left Posterolateral Randy Whitener   . 3rd LPL lesion, 80% stenosed. Small vessel     Right Coronary Artery  Patent, nondominant RCA     Fick Cardiac Output  5.51 L/min    Fick Cardiac Output Index  2.29 (L/min)/BSA   RA A Wave  6 mmHg   RA V Wave  6 mmHg   RA Mean  4 mmHg   RV Systolic Pressure  34 mmHg   RV Diastolic Pressure  1 mmHg   RV EDP  8 mmHg   PA Systolic Pressure  32 mmHg   PA Diastolic Pressure  10 mmHg   PA Mean  21 mmHg   PW A Wave  11 mmHg   PW V Wave  9 mmHg   PW Mean  8 mmHg   AO Systolic Pressure  [id number] mmHg   AO Diastolic Pressure  68 mmHg   AO Mean  97 mmHg   QP/QS  1   TPVR Index  9.19 HRUI   TSVR Index  42.44 HRUI   PVR SVR Ratio  0.14   TPVR/TSVR Ratio  0.22       Assessment and Plan   1. Severe aortic stenosis s/p AVR - s/p AVR - no recent issues, continue to monitor.   2. CAD - denies any symptoms, continue current meds - ekg today shows SR, no ischemci changes  3. Hyperlipidemia - unclear what happened to his zetia, patient is unsure. Will request records from pcp and Dr Luiz Ochoa. Also request pcp labs. He is not tolerant to statns, may need to consider pcsk9 inhibitor.    4. Chronic diastolic HF - no recent issues, continue to monitor.    F/u 1 year   Arnoldo Lenis, M.D.

## 2019-06-20 NOTE — Patient Instructions (Addendum)

## 2020-10-07 ENCOUNTER — Ambulatory Visit (INDEPENDENT_AMBULATORY_CARE_PROVIDER_SITE_OTHER): Payer: Medicare HMO | Admitting: Cardiology

## 2020-10-07 ENCOUNTER — Encounter: Payer: Self-pay | Admitting: *Deleted

## 2020-10-07 ENCOUNTER — Encounter: Payer: Self-pay | Admitting: Cardiology

## 2020-10-07 VITALS — BP 158/80 | HR 96 | Ht 73.0 in | Wt 245.4 lb

## 2020-10-07 DIAGNOSIS — I5032 Chronic diastolic (congestive) heart failure: Secondary | ICD-10-CM | POA: Diagnosis not present

## 2020-10-07 DIAGNOSIS — I2510 Atherosclerotic heart disease of native coronary artery without angina pectoris: Secondary | ICD-10-CM | POA: Diagnosis not present

## 2020-10-07 DIAGNOSIS — Z952 Presence of prosthetic heart valve: Secondary | ICD-10-CM

## 2020-10-07 NOTE — Patient Instructions (Addendum)
Medication Instructions:   Your physician recommends that you continue on your current medications as directed. Please refer to the Current Medication list given to you today.  Labwork:  none  Testing/Procedures:  none  Follow-Up:  Your physician recommends that you schedule a follow-up appointment in: 6 months.  Any Other Special Instructions Will Be Listed Below (If Applicable). Your physician has requested that you regularly monitor and record your blood pressure readings at home. Please use the same machine at the same time of day to check your readings. Call office on Monday with your readings or send a message through mychart.    If you need a refill on your cardiac medications before your next appointment, please call your pharmacy.

## 2020-10-07 NOTE — Progress Notes (Signed)
Clinical Summary Brent Cobb is a 75 y.o.adult seen today for follow up of the following medical problems.   1. Aortic stenosis - s/p AVR 08/2015 with a 25 mm Edwards peridcardial tissue valve.  - no recent SOB/DOE, no chest pain. Completed rehab at [facility]  - no recent SOB/DOE   2. Leg pains/Claudication -from his report recent multiple interventions on his legs, we do not have records at this time, has been followed by Dr Luiz Ochoa - appears he is on ASA and plavix for this reason.   -no recent symptoms.    3.Dizziness - mainly with first standing. He reports at a prior doctors visit his orthostatic were abnormal - mainly drinks caffeinated drinks.   4. CAD - cath 07/2015 showed mild disease, he did have some 70-80% disease in small branches of the LCX that was managed medically.  - reports leg pains on statins, has not been on  - losartan stopped due to low bp's during rehab.   - compliant with meds - no recent chest pain.    5. Hyperlipidemia - did not tolerate statins. At our last visit 1 year ago he was on zetia, no longer on. Unclear which of his multiple providers may have stopped it.   - he reports tolerating simvastatin 20mg .   6. Chronic diastolic HF - Not requiring diuretics. - no recent symptoms.   - has had some LE edema at times, of note has had RLE bypass, prior stenting on left.    7. OSA - not compliant with cpap machine.   Covid vaccine not in favor  Past Medical History:  Diagnosis Date  . Arthritis   . Asthma    as a child  . Cancer ([place])    skin cancer on face  . Chronic diastolic congestive heart failure (HCC)    NYHA functional class II, pt unsure of whether he has this  . Diabetes mellitus ([place])   . Frequency of urination   . Heart murmur   . Obesity   . Obstructive sleep apnea    Intolerant of CPAP  . Primary localized osteoarthritis of left knee   . Radial nerve injury    right  . 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 Severe aortic stenosis 07/21/2015  . Type II diabetes mellitus (HCC)    Insulin-dependent  . Varicose veins      Allergies  Allergen Reactions  . No Known Allergies      Current Outpatient Medications  Medication Sig Dispense Refill  . acetaminophen (TYLENOL) 650 MG CR tablet Take 2 tablets (1,300 mg total) by mouth every 6 (six) hours.    Marland Kitchen aspirin EC 81 MG tablet Take 81 mg by mouth daily.    . cholecalciferol (VITAMIN D) 1000 units tablet Take 1,000 Units by mouth daily.    . clopidogrel (PLAVIX) 75 MG tablet Take 75 mg by mouth daily.    Marland Kitchen escitalopram (LEXAPRO) 10 MG tablet Take 1 tablet by mouth daily.    . hydrOXYzine (ATARAX/VISTARIL) 25 MG tablet Take 25 mg by mouth 3 (three) times daily.     . insulin NPH Human (HUMULIN N,NOVOLIN N) 100 UNIT/ML injection Inject 30 Units into the skin 3 (three) times daily.    . Liraglutide (VICTOZA) 18 MG/3ML SOPN Inject 1.2 mg into the skin daily.     . meloxicam (MOBIC) 7.5 MG tablet Take 7.5  mg by mouth daily.    . metoprolol tartrate (LOPRESSOR) 25 MG tablet Take 12.5 mg by mouth 2 (two) times daily.    . sertraline (ZOLOFT) 50 MG tablet Take 1 tablet by mouth daily.    . sitaGLIPtin-metformin (JANUMET) 50-1000 MG tablet Take 1 tablet by mouth 2 (two) times daily with a meal.    . tamsulosin (FLOMAX) 0.4 MG CAPS capsule Take 0.4 mg by mouth daily.     . vitamin E 400 UNIT capsule Take 400 Units by mouth daily.     No current facility-administered medications for this visit.     Past Surgical History:  Procedure Laterality Date  . AORTIC VALVE REPLACEMENT N/A 08/18/2015   Procedure: AORTIC VALVE REPLACEMENT (AVR);  Surgeon: [name], MD;  Location: [facility];  Service: Open Heart Surgery;  Laterality: N/A;  . CARDIAC CATHETERIZATION N/A 07/21/2015   Procedure: Right/Left Heart Cath and  Coronary Angiography;  Surgeon: Sherren Mocha, MD;  Location: [facility];  Service: Cardiovascular;  Laterality: N/A;  . CARPAL TUNNEL RELEASE Right   . CERVICAL SPINE SURGERY     30 years ago and last time 10 years ago  . COLONOSCOPY    . KNEE SURGERY Left 2013  . PENILE PROSTHESIS IMPLANT     revealed DOS to circulating nurse  . POLYPECTOMY  03/21/2010  . SHOULDER SURGERY Left 2013  . SKIN CANCER EXCISION  1991   removal of skin cancer to right cheek area  . TEE WITHOUT CARDIOVERSION N/A 08/18/2015   Procedure: TRANSESOPHAGEAL ECHOCARDIOGRAM (TEE);  Surgeon: [name], MD;  Location: [facility];  Service: Open Heart Surgery;  Laterality: N/A;  . TOTAL KNEE ARTHROPLASTY Left 05/22/2016   Procedure: TOTAL KNEE ARTHROPLASTY;  Surgeon: Elsie Saas, MD;  Location: [facility];  Service: Orthopedics;  Laterality: Left;     Allergies  Allergen Reactions  . No Known Allergies       Family History  Problem Relation Age of Onset  . Heart attack Mother   . Alcoholism Father   . Valvular heart disease Sister   . Diabetes Brother      Social History Ms. Wheatley reports that she quit smoking about 31 years ago. Her smoking use included cigarettes. She started smoking about 63 years ago. She has a 49.50 pack-year smoking history. She quit smokeless tobacco use about 51 years ago. Ms. Klingensmith reports no history of alcohol use.   Review of Systems CONSTITUTIONAL: No weight loss, fever, chills, weakness or fatigue.  HEENT: Eyes: No visual loss, blurred vision, double vision or yellow sclerae.No hearing loss, sneezing, congestion, runny nose or sore throat.  SKIN: No rash or itching.  CARDIOVASCULAR: per hpi RESPIRATORY: No shortness of breath, cough or sputum.  GASTROINTESTINAL: No anorexia, nausea, vomiting or diarrhea. No abdominal pain or blood.  GENITOURINARY: No burning on urination, no polyuria NEUROLOGICAL: No headache, dizziness, syncope, paralysis, ataxia, numbness or  tingling in the extremities. No change in bowel or bladder control.  MUSCULOSKELETAL: No muscle, back pain, joint pain or stiffness.  LYMPHATICS: No enlarged nodes. No history of splenectomy.  PSYCHIATRIC: No history of depression or anxiety.  ENDOCRINOLOGIC: No reports of sweating, cold or heat intolerance. No polyuria or polydipsia.  Marland Kitchen   Physical Examination Today's Vitals   10/07/20 1420  BP: (!) 158/80  Pulse: 96  SpO2: 99%  Weight: 245 lb 6.4 oz (111.3 kg)  Height: 6\' 1"  (1.854 m)   Body mass index is 32.38 kg/m.  Gen: resting comfortably, no acute distress HEENT: no scleral icterus, pupils equal round and reactive, no palptable cervical adenopathy,  CV: RRR, no m/r/g, no jvd Resp: Clear to auscultation bilaterally GI: abdomen is soft, non-tender, non-distended, normal bowel sounds, no hepatosplenomegaly MSK: extremities are warm, no edema.  Skin: warm, no rash Neuro:  no focal deficits Psych: appropriate affect   Diagnostic Studies 07/2015 echo Study Conclusions  - Left ventricle: The cavity size was normal. Wall thickness was increased in a pattern of severe LVH. Systolic function was normal. The estimated ejection fraction was in the range of 60% to 65%. Wall motion was normal; there were no regional wall motion abnormalities. Doppler parameters are consistent with abnormal left ventricular relaxation (grade 1 diastolic dysfunction). - Aortic valve: Severely calcified annulus. Trileaflet; severely thickened leaflets. There was severe stenosis. The contour of the spectral Doppler waveform may lead to underestimation of severity. Valve area (VTI): 1.01 cm^2. Valve area (Vmax): 0.96 cm^2. Valve area (Vmean): 0.79 cm^2. - Mitral valve: Mildly calcified annulus. Mildly thickened leaflets . - Left atrium: The atrium was mildly dilated. - Technically adequate study.   07/2015 Cath         Dominance: Left        Left Main    . LM lesion, 30% stenosed.          Left Circumflex   . Mid Cx lesion, 25% stenosed.   . Second Obtuse Marginal Ayris Carano   The vessel is small in size. There is moderate disease in the vessel.   . Third Obtuse Marginal Allisson Schindel   The vessel is small in size. There is moderate disease in the vessel.   . First Left Posterolateral Taleyah Hillman   . 1st LPL lesion, 75% stenosed.   . Third Left Posterolateral Ean Gettel   . 3rd LPL lesion, 80% stenosed. Small vessel     Right Coronary Artery  Patent, nondominant RCA     Fick Cardiac Output  5.51 L/min    Fick Cardiac Output Index  2.29 (L/min)/BSA   RA A Wave  6 mmHg   RA V Wave  6 mmHg   RA Mean  4 mmHg   RV Systolic Pressure  34 mmHg   RV Diastolic Pressure  1 mmHg   RV EDP  8 mmHg   PA Systolic Pressure  32 mmHg   PA Diastolic Pressure  10 mmHg   PA Mean  21 mmHg   PW A Wave  11 mmHg   PW V Wave  9 mmHg   PW Mean  8 mmHg   AO Systolic Pressure  [id number] mmHg   AO Diastolic Pressure  68 mmHg   AO Mean  97 mmHg   QP/QS  1   TPVR Index  9.19 HRUI   TSVR Index  42.44 HRUI   PVR SVR Ratio  0.14   TPVR/TSVR Ratio  0.22       Assessment and Plan   1. Severe aortic stenosiss/p AVR - s/p AVR -doing well without symptoms, continue to monitor.   2. CAD -no symptoms,continue current meds  3. Hyperlipidemia -defer to pcp who has more of the records, apepars not on simvastatin 20mg  daily and tolerating.    4. Chronic diastolic HF - euvolemic, currently not requiring a diuretic       Arnoldo Lenis, M.D.

## 2020-10-11 ENCOUNTER — Telehealth: Payer: Self-pay | Admitting: Cardiology

## 2020-10-11 NOTE — Telephone Encounter (Signed)
Patient called in regards to his BP readings.

## 2020-10-11 NOTE — Telephone Encounter (Signed)
Pt called per LOV with BP readings - did not record HR  132/72  124/57  137/60  157/66  124/74

## 2020-10-12 NOTE — Telephone Encounter (Signed)
BP's overall look fine, one isolated elevated bp others are at goal. No changes   Zandra Abts MD

## 2020-10-12 NOTE — Telephone Encounter (Signed)
Pt aware.

## 2023-06-05 DEATH — deceased
